# Patient Record
Sex: Female | Born: 1965
Health system: Southern US, Community
[De-identification: ages and names within clinical notes are randomized; demographics above are authoritative.]

## PROBLEM LIST (undated history)

## (undated) DIAGNOSIS — K219 Gastro-esophageal reflux disease without esophagitis: Secondary | ICD-10-CM

## (undated) DIAGNOSIS — J45909 Unspecified asthma, uncomplicated: Secondary | ICD-10-CM

## (undated) DIAGNOSIS — F329 Major depressive disorder, single episode, unspecified: Secondary | ICD-10-CM

## (undated) DIAGNOSIS — R000 Tachycardia, unspecified: Secondary | ICD-10-CM

## (undated) DIAGNOSIS — K8020 Calculus of gallbladder without cholecystitis without obstruction: Secondary | ICD-10-CM

## (undated) DIAGNOSIS — F419 Anxiety disorder, unspecified: Secondary | ICD-10-CM

## (undated) DIAGNOSIS — R519 Headache, unspecified: Secondary | ICD-10-CM

## (undated) DIAGNOSIS — T7840XA Allergy, unspecified, initial encounter: Secondary | ICD-10-CM

## (undated) DIAGNOSIS — E785 Hyperlipidemia, unspecified: Secondary | ICD-10-CM

## (undated) DIAGNOSIS — R011 Cardiac murmur, unspecified: Secondary | ICD-10-CM

## (undated) DIAGNOSIS — M797 Fibromyalgia: Secondary | ICD-10-CM

## (undated) DIAGNOSIS — D689 Coagulation defect, unspecified: Secondary | ICD-10-CM

## (undated) DIAGNOSIS — I10 Essential (primary) hypertension: Secondary | ICD-10-CM

## (undated) DIAGNOSIS — I82401 Acute embolism and thrombosis of unspecified deep veins of right lower extremity: Secondary | ICD-10-CM

## (undated) DIAGNOSIS — J449 Chronic obstructive pulmonary disease, unspecified: Secondary | ICD-10-CM

## (undated) DIAGNOSIS — R0902 Hypoxemia: Secondary | ICD-10-CM

## (undated) DIAGNOSIS — M1990 Unspecified osteoarthritis, unspecified site: Secondary | ICD-10-CM

## (undated) DIAGNOSIS — K5790 Diverticulosis of intestine, part unspecified, without perforation or abscess without bleeding: Secondary | ICD-10-CM

## (undated) DIAGNOSIS — M459 Ankylosing spondylitis of unspecified sites in spine: Secondary | ICD-10-CM

## (undated) DIAGNOSIS — J189 Pneumonia, unspecified organism: Secondary | ICD-10-CM

## (undated) DIAGNOSIS — R51 Headache: Secondary | ICD-10-CM

## (undated) DIAGNOSIS — G8929 Other chronic pain: Secondary | ICD-10-CM

## (undated) DIAGNOSIS — D649 Anemia, unspecified: Secondary | ICD-10-CM

## (undated) DIAGNOSIS — H269 Unspecified cataract: Secondary | ICD-10-CM

## (undated) DIAGNOSIS — F32A Depression, unspecified: Secondary | ICD-10-CM

## (undated) DIAGNOSIS — Z5189 Encounter for other specified aftercare: Secondary | ICD-10-CM

## (undated) DIAGNOSIS — G4730 Sleep apnea, unspecified: Secondary | ICD-10-CM

## (undated) DIAGNOSIS — A630 Anogenital (venereal) warts: Secondary | ICD-10-CM

## (undated) DIAGNOSIS — R87619 Unspecified abnormal cytological findings in specimens from cervix uteri: Secondary | ICD-10-CM

## (undated) DIAGNOSIS — IMO0002 Reserved for concepts with insufficient information to code with codable children: Secondary | ICD-10-CM

## (undated) DIAGNOSIS — K649 Unspecified hemorrhoids: Secondary | ICD-10-CM

## (undated) HISTORY — PX: BREAST BIOPSY: SHX20

## (undated) HISTORY — DX: Unspecified abnormal cytological findings in specimens from cervix uteri: R87.619

## (undated) HISTORY — DX: Reserved for concepts with insufficient information to code with codable children: IMO0002

## (undated) HISTORY — PX: CERVICAL BIOPSY  W/ LOOP ELECTRODE EXCISION: SUR135

## (undated) HISTORY — DX: Anogenital (venereal) warts: A63.0

## (undated) HISTORY — DX: Unspecified hemorrhoids: K64.9

## (undated) HISTORY — DX: Coagulation defect, unspecified: D68.9

## (undated) HISTORY — PX: CHOLECYSTECTOMY: SHX55

## (undated) HISTORY — DX: Tachycardia, unspecified: R00.0

## (undated) HISTORY — DX: Hypoxemia: R09.02

## (undated) HISTORY — DX: Allergy, unspecified, initial encounter: T78.40XA

## (undated) HISTORY — DX: Unspecified cataract: H26.9

## (undated) HISTORY — DX: Depression, unspecified: F32.A

## (undated) HISTORY — DX: Gastro-esophageal reflux disease without esophagitis: K21.9

## (undated) HISTORY — DX: Major depressive disorder, single episode, unspecified: F32.9

## (undated) HISTORY — DX: Anemia, unspecified: D64.9

## (undated) HISTORY — DX: Anxiety disorder, unspecified: F41.9

## (undated) HISTORY — DX: Chronic obstructive pulmonary disease, unspecified: J44.9

## (undated) HISTORY — DX: Encounter for other specified aftercare: Z51.89

## (undated) HISTORY — DX: Cardiac murmur, unspecified: R01.1

## (undated) HISTORY — DX: Headache: R51

## (undated) HISTORY — DX: Ankylosing spondylitis of unspecified sites in spine: M45.9

## (undated) HISTORY — DX: Headache, unspecified: R51.9

## (undated) HISTORY — DX: Diverticulosis of intestine, part unspecified, without perforation or abscess without bleeding: K57.90

## (undated) HISTORY — DX: Unspecified asthma, uncomplicated: J45.909

## (undated) HISTORY — DX: Other chronic pain: G89.29

## (undated) HISTORY — DX: Calculus of gallbladder without cholecystitis without obstruction: K80.20

## (undated) HISTORY — DX: Fibromyalgia: M79.7

## (undated) HISTORY — DX: Pneumonia, unspecified organism: J18.9

## (undated) HISTORY — DX: Hyperlipidemia, unspecified: E78.5

## (undated) HISTORY — DX: Unspecified osteoarthritis, unspecified site: M19.90

---

## 1995-02-17 ENCOUNTER — Emergency Department: Admit: 1995-02-17 | Payer: Self-pay | Source: Emergency Department | Admitting: Pediatric Emergency Medicine

## 1996-01-06 ENCOUNTER — Ambulatory Visit: Admit: 1996-01-06 | Disposition: A | Discharge status: Home / Self Care | Payer: Self-pay | Source: Ambulatory Visit

## 2000-01-01 ENCOUNTER — Ambulatory Visit (HOSPITAL_BASED_OUTPATIENT_CLINIC_OR_DEPARTMENT_OTHER): Admission: RE | Admit: 2000-01-01 | Payer: Self-pay | Source: Ambulatory Visit | Admitting: Gynecology

## 2006-07-15 DIAGNOSIS — K469 Unspecified abdominal hernia without obstruction or gangrene: Secondary | ICD-10-CM

## 2006-07-15 HISTORY — DX: Unspecified abdominal hernia without obstruction or gangrene: K46.9

## 2006-07-15 HISTORY — PX: ABDOMINAL HYSTERECTOMY: SHX81

## 2006-10-14 ENCOUNTER — Inpatient Hospital Stay (HOSPITAL_BASED_OUTPATIENT_CLINIC_OR_DEPARTMENT_OTHER)
Admit: 2006-10-14 | Disposition: A | Discharge status: Home / Self Care | Payer: Self-pay | Source: Ambulatory Visit | Admitting: Obstetrics & Gynecology

## 2007-03-16 ENCOUNTER — Inpatient Hospital Stay (HOSPITAL_BASED_OUTPATIENT_CLINIC_OR_DEPARTMENT_OTHER)
Admission: RE | Admit: 2007-03-16 | Disposition: A | Discharge status: Home / Self Care | Payer: Self-pay | Source: Ambulatory Visit | Admitting: Obstetrics & Gynecology

## 2007-03-16 LAB — CBC- CERNER
Hematocrit: 36 % — ABNORMAL LOW (ref 37.0–47.0)
Hgb: 12.4 G/DL (ref 12.0–16.0)
MCH: 31.4 PG (ref 28.0–32.0)
MCHC: 34.6 G/DL (ref 32.0–36.0)
MCV: 90.7 FL (ref 80.0–100.0)
RBC: 3.97 /mm3 — ABNORMAL LOW (ref 4.20–5.40)
RDW: 13.6 % (ref 11.5–15.0)
WBC: 8.4 /mm3 (ref 3.5–10.8)

## 2007-03-16 LAB — LABOR AND DELIVERY PILOT(SOFT)

## 2007-08-14 ENCOUNTER — Ambulatory Visit: Admission: RE | Admit: 2007-08-14 | Payer: Self-pay | Source: Ambulatory Visit | Admitting: Surgery

## 2009-04-04 ENCOUNTER — Ambulatory Visit
Admission: RE | Admit: 2009-04-04 | Disposition: A | Discharge status: Home / Self Care | Payer: Self-pay | Source: Ambulatory Visit | Admitting: Clinical Cardiac Electrophysiology

## 2011-05-03 NOTE — Op Note (Signed)
DATE OF BIRTH:                        Feb 07, 1966      ADMISSION DATE:                     08/14/2007            PATIENT LOCATION:                     [id number]            DATE OF PROCEDURE:                   08/14/2007      SURGEON:                            Campbell Lerner, MD      ASSISTANT(S):                  PREOPERATIVE DIAGNOSIS:  UMBILICAL HERNIA.            POSTOPERATIVE DIAGNOSIS:  UMBILICAL HERNIA.            PROCEDURE:  UMBILICAL HERNIA REPAIR WITHOUT MESH.            ANESTHESIA:  MAC, local.            INDICATION:  A 45 year old female with a symptomatic umbilical hernia.            DESCRIPTION OF PROCEDURE:  In the preoperative holding area, the patient      received intravenous antibiotics.  The need for DVT prophylaxis was      assessed.  The patient was brought to the operating room and placed on the      operating table in the supine position.  IV sedation was administered.  The      abdominal wall prepped with Betadine gel prep and draped in a sterile      fashion.  A curvilinear supraumbilical incision was made and brought down      to the hernia sac.  The sac was carefully and fully dissected away from the      subcutaneous tissue and umbilical skin down to the level of the fascia.  At      the fascial edges, the sac was excised.  The contents were reduced. The      fascial edges were cleared.  The fascial defect was 9 mm.  The fascial      edges were reapproximated with a figure-of-eight 0 Ethibond suture.  With      the repair completed, the site was inspected.  Hemostasis was assured  The      umbilical skin was tacked back down to the level of the fascia with a      Vicryl suture.  A layered closure was performed with Monocryl suture.  The      skin was washed and dried.  Mastisol and Steri-Strips were placed.  A      Tegaderm dressing was applied.  The patient was taken to the PACU at the      end of the case having tolerated the procedure well.  Electronic Signing MD: Campbell Lerner, MD  ([id number])            D: 08/14/2007 by Campbell Lerner, MD      T: 08/14/2007 by [id number] ([id number]) ([id number])      cc:  Campbell Lerner, MD          Hazle Coca, MD

## 2011-05-03 NOTE — Procedures (Signed)
Higinio Roger, M.D.,FACC, FACP                          Wendie Simmer. Wish, M.D.,FACC      Quincy Carnes Fischer, M.D.,FAAP,FACC,FACP          Caren Hazy. Dorian Heckle, M.D., [facility]      Richardo Hanks, M.D.,FACC                             Carlye Grippe, M.D., [facility]            Name:          Elizabeth Dougherty, Elizabeth Dougherty               Age           45      Study Date     04/04/2009                       Gender        Female      MRN            [id number]                         Race      EP Study No    [id number]                         Ht(in)        64.2      Account No.    [id number]                        Wt(lbs)       119.9      Date of Birth  April 28, 1966                       BSA           1.60            CASE INFORMATION      Referring MD:                   Massimiano,Paul,M.D.      Referring MD 2:                 Oshry,Stacy,M.D.      Electrophysiologist:            Friehling,Ted,M.D.       nd      2  Electrophysiologist:         Del Negro,Albert,M.D.      Anesthesia Type:                IV Anesthesia      Estimated Blood Loss:           0-10 cc                        Procedures      3-D Mapping      Ablation SVT      EP Study            History       45 yo female with long history of sustained palpatations which have become       longer in  duration.  Finally, out patient monitoring demonstrated narrow QRS       tachycardia without evidenct atrial activity suggestive of AVNRT.  The patient       presents now for EPS/Ablation.            Procedural Medication            Dose      Unit       Route                                       75        ml         IV                                       10        ml         Subcut                                       3         ml         Subcut                                       12        mg         IV                                       1         mcg per    IV                                                 min                                       1         mcg per    IV                                                  min                  Catheter Locations      Intracardiac Site      Sheath Size   Catheter Type   Access Site      HRA                    6 FR          Quad            LFV      HIS  6 FR          Quad            RFV      CS                     8 FR          Deca            LSCV      RVA                   6 FR          Quad           LFV      Ablation              8 FR          4mm            RFV            System:    Biosense      Approach:  Right Side            Description of Procedure         The risks, benefits, potential complications and alternatives to the      procedure and sedation were explained to the patient.  Informed consent was      obtained.  Sedation analgesia was administered and monitored by anesthesia      department.               The patient was brought to the procedure room in a fasting state.  ECG and      non-invasive blood pressure monitoring were established and peripheral      intravenous access initiated for medication and fluid administration.  The      patient was prepped and draped in the usual sterile manner.  Prior to obtaining      vascular access Lidocaine 1% was used to infiltrate the area for local      anesthetic effect.  The right and left femoral veins and left subclavian vein      were accessed via the modified Seldinger technique and cannulated with      introducer sheaths.  Three quadrapolar catheters were advanced under      fluoroscopy from the femoral site and positioned at the high right atrium,      bundle of HIS and RV Apex.  A decapolar catheter was advanced under fluoroscopy      from the subclavian site and positioned in the coronary sinus.               Baseline electrophysiologic testing was performed by Dr. Kerrie Pleasure.  The      basic intervals, refractory periods, and conduction characteristics were      obtained. Decremental pacing and programmed stimulation protocols were      delivered from the high right atrium  and right ventricular apex sites.  AVNRT      was induced, CL 302 msec but terminated spontaneously.  Ret rograde conduction      was central and adenosine 12 mg IV delivered during ventricular pacing induced      retrograde as well as anterograde block.               Isuprel was started and repeat atrial pacing easily induced sustained AVNRT,      CL 290 msec.   The atrium  could be dissociated from the ventricle and the      ventricle could be dissociated from the atrium.Catheter               Isuprel was D/C'd and the geometry of the high right atrium was created with      contact mapping using the Biosense 3-dimensional mapping system. Using a      NIKEF" 4mm catheter, RF applications were delivered during sinus      rhythm by Dr. Dorian Heckle.  During RF delivery, junctional acceleration occurred      during which time Dr. Kerrie Pleasure performed atrial overdrive pacing to assure the      integrity of AV conduction.  Several applications of RF energy were delivered.               After an appropriate waiting period, post ablation pacing was performed  and      isola ted AV nodal reentry beats were noted.  Isuprel was restarted at the dose      that facilitated arrhythmia induction and repeat testing induced no arrhythmia.               Isuprel was D/C'd and all catheters and sheaths were removed and hemostasis      was achieved using manual compression. The patient was transferred to the      recovery area in stable condition.               Throughout the procedure, general anesthesia was provided and appropriate      monitoring performed by members of the anesthesia staff.                  Findings      1.  Typical AVNRT      2.  Successful ablation of arrhythmia substrait            Plan      1.  Routine follow-up      2.  ASA 325  mg po daily X 4 weeks                        Basic Intervals            Phase                     Baseline      PA (msec)                 18      AH (msec)                  49      HV (msec)                 63      VV (msec)                 917      AA (msec)                 917                              Conduction Block      Phase                      Pacing Interval Description  Baseline                   428             AV [place]      Baseline                   311             [place]      Baseline                   295             AV [place]      Baseline                   318             AV [place]      Baseline                   318             AV [place]            Refractory Periods      Phase                   Type               Pacing Site Value       PCL      Baseline                ERP                AVN         314         600      Baseline                ERP                Retrograde  248         390                                                 AV Node            Programmed Stimulation            Intervention               Baseline      Cycle Length               435      Site                       HRA      # of Beats                sustained      Initiation Sequence       460      Tachycardia Form          SVT      Termination               self                  Intervention  Baseline      Cycle Length              500      Site                      RVA      Extra Stimuli             S1-S2      ERP                       230      Initiation Sequence       500                  Intervention              Isuprel      Dose                      1      Unit                      mcg/min      Cycle Length              500      Site                      HRA      Extra Stimuli             S1-S3      # of Beats                Sustained      Initiation Sequence       500/320/280      Tachycardia Form          SVT      Tachycardia Cycle Length  311      Termination               260 on HRA                             Updated by  Del Negro,Albert,M.D.  on  04/04/2009 10:18:14 AM                       Jasmine Awe, M.D.  electronically signed  on 04/04/2009 10:24:35 AM      with status of Final

## 2012-01-31 ENCOUNTER — Encounter: Payer: Self-pay | Admitting: Obstetrics & Gynecology

## 2012-01-31 NOTE — Progress Notes (Unsigned)
Spoke with pt regarding [place] results.  Her L paraovarian cyst is marginally larger (by 1 cm in one dimension), but pt still denies any pelvic pain or dysparunea.  Again, discussed laparoscopic removal of the cyst if pt becomes symptomatic, or would just like it to be removed, otherwise will repeat US annually.

## 2012-06-15 ENCOUNTER — Encounter: Payer: Self-pay | Admitting: Obstetrics & Gynecology

## 2012-08-18 ENCOUNTER — Ambulatory Visit: Payer: Self-pay | Admitting: Obstetrics & Gynecology

## 2012-10-09 ENCOUNTER — Encounter: Payer: Self-pay | Admitting: Obstetrics & Gynecology

## 2012-10-09 ENCOUNTER — Ambulatory Visit: Admitting: Obstetrics & Gynecology

## 2012-10-09 VITALS — BP 114/70 | Ht 64.0 in | Wt 114.0 lb

## 2012-10-09 NOTE — Progress Notes (Signed)
Subjective:       Elizabeth Dougherty is a 47 y.o. female here for a routine exam.  Current complaints: none.  Personal health questionnaire reviewed: yes.  Pt reports nl menses q month.  She always has some d/c, but is not sure if it related to her cycle.  No dysuria or dyspareunia.  Nl BM's.  No incontinence.  All 3 kids are very active and doing well.  Still not having any pain issues related to her L paraovarian cyst.     Gynecologic History  Patient's last menstrual period was 09/11/2012.  Contraception: vasectomy  Breast self exam: discussed  Common GYN tests  Last Pap Date: 07/24/11  Last Pap Result: Normal  Last Mammo Date: 01/29/12  Last Mammo Result: Normal    The following portions of the patient's history were reviewed and updated as appropriate: allergies, current medications, past family history, past medical history, past social history, past surgical history and problem list.      Review of Systems  Pertinent items are noted in HPI.      Objective:      BP 114/70  Ht 1.626 m (5\' 4" )  Wt 51.71 kg (114 lb)  BMI 19.56 kg/m2  LMP 09/11/2012  General appearance: alert, appears stated age and cooperative  Neck: no adenopathy, supple, symmetrical, trachea midline and thyroid not enlarged, symmetric, no tenderness/mass/nodules  Breasts: normal appearance, no masses or tenderness, No nipple retraction or dimpling, No nipple discharge or bleeding, No axillary or supraclavicular adenopathy  Abdomen: soft, non-tender; bowel sounds normal; no masses,  no organomegaly  Pelvic exam:     Urinary system: urethral meatus normal   External genitalia: normal general appearance   Vaginal: normal rugae   Cervix: normal appearance   Adnexa: normal bimanual exam   Uterus: normal single, nontender   Rectal: deferred          Assessment:      Healthy female exam.        Plan:      Mammogram ordered.  Follow up in: 1 year.  Thin prep pap/HPV deferred secondary to nl Pap in 2013 with negative HR HPV  Referral given for sono to  f/u L paraovarian cyst

## 2013-06-27 ENCOUNTER — Encounter (HOSPITAL_COMMUNITY): Payer: Self-pay | Admitting: Emergency Medicine

## 2013-06-27 ENCOUNTER — Inpatient Hospital Stay (HOSPITAL_COMMUNITY)
Admission: EM | Admit: 2013-06-27 | Discharge: 2013-06-29 | DRG: 607 | Disposition: A | Discharge status: Home / Self Care | Payer: Self-pay | Attending: Internal Medicine | Admitting: Internal Medicine

## 2013-06-27 DIAGNOSIS — Z8249 Family history of ischemic heart disease and other diseases of the circulatory system: Secondary | ICD-10-CM

## 2013-06-27 DIAGNOSIS — K1370 Unspecified lesions of oral mucosa: Secondary | ICD-10-CM | POA: Diagnosis present

## 2013-06-27 DIAGNOSIS — Z880 Allergy status to penicillin: Secondary | ICD-10-CM

## 2013-06-27 DIAGNOSIS — Z91018 Allergy to other foods: Secondary | ICD-10-CM

## 2013-06-27 DIAGNOSIS — Z833 Family history of diabetes mellitus: Secondary | ICD-10-CM

## 2013-06-27 DIAGNOSIS — Z823 Family history of stroke: Secondary | ICD-10-CM

## 2013-06-27 DIAGNOSIS — Z86718 Personal history of other venous thrombosis and embolism: Secondary | ICD-10-CM

## 2013-06-27 DIAGNOSIS — D72829 Elevated white blood cell count, unspecified: Secondary | ICD-10-CM | POA: Diagnosis present

## 2013-06-27 DIAGNOSIS — R21 Rash and other nonspecific skin eruption: Secondary | ICD-10-CM | POA: Diagnosis present

## 2013-06-27 DIAGNOSIS — E876 Hypokalemia: Secondary | ICD-10-CM | POA: Diagnosis present

## 2013-06-27 DIAGNOSIS — G4730 Sleep apnea, unspecified: Secondary | ICD-10-CM | POA: Diagnosis present

## 2013-06-27 DIAGNOSIS — I10 Essential (primary) hypertension: Secondary | ICD-10-CM | POA: Diagnosis present

## 2013-06-27 DIAGNOSIS — D649 Anemia, unspecified: Secondary | ICD-10-CM | POA: Diagnosis not present

## 2013-06-27 DIAGNOSIS — L500 Allergic urticaria: Principal | ICD-10-CM | POA: Diagnosis present

## 2013-06-27 DIAGNOSIS — Z79899 Other long term (current) drug therapy: Secondary | ICD-10-CM

## 2013-06-27 HISTORY — DX: Essential (primary) hypertension: I10

## 2013-06-27 HISTORY — DX: Sleep apnea, unspecified: G47.30

## 2013-06-27 HISTORY — DX: Acute embolism and thrombosis of unspecified deep veins of right lower extremity: I82.401

## 2013-06-27 LAB — BASIC METABOLIC PANEL
BUN: 14 mg/dL (ref 6–23)
Calcium: 9.3 mg/dL (ref 8.4–10.5)
Chloride: 97 mEq/L (ref 96–112)
Creatinine, Ser: 0.72 mg/dL (ref 0.50–1.10)
GFR calc Af Amer: >90 mL/min (ref >90)
GFR calc non Af Amer: >90 mL/min (ref >90)

## 2013-06-27 LAB — CBC WITH DIFFERENTIAL/PLATELET
Basophils Absolute: 0 10*3/uL (ref 0.0–0.1)
Basophils Relative: 0 % (ref 0–1)
Eosinophils Absolute: 0.3 10*3/uL (ref 0.0–0.7)
Eosinophils Relative: 3 % (ref 0–5)
HCT: 37.2 % (ref 36.0–46.0)
Hemoglobin: 12.5 g/dL (ref 12.0–15.0)
Lymphocytes Relative: 20 % (ref 12–46)
MCH: 24 pg — ABNORMAL LOW (ref 26.0–34.0)
MCHC: 33.6 g/dL (ref 30.0–36.0)
MCV: 71.4 fL — ABNORMAL LOW (ref 78.0–100.0)
Monocytes Absolute: 0.5 10*3/uL (ref 0.1–1.0)
Monocytes Relative: 4 % (ref 3–12)
Neutro Abs: 8.7 10*3/uL — ABNORMAL HIGH (ref 1.7–7.7)
RDW: 15.4 % (ref 11.5–15.5)

## 2013-06-27 LAB — URINALYSIS, ROUTINE W REFLEX MICROSCOPIC
Bilirubin Urine: NEGATIVE
Ketones, ur: NEGATIVE mg/dL
Leukocytes, UA: NEGATIVE
Nitrite: NEGATIVE
Protein, ur: NEGATIVE mg/dL
Urobilinogen, UA: 0.2 mg/dL (ref 0.0–1.0)
pH: 6.5 (ref 5.0–8.0)

## 2013-06-27 MED ORDER — ONDANSETRON HCL 4 MG PO TABS: 4.0000 mg | ORAL_TABLET | Freq: Four times a day (QID) | ORAL | Status: DC | PRN

## 2013-06-27 MED ORDER — FAMOTIDINE IN NACL 20-0.9 MG/50ML-% IV SOLN
20.0000 mg | Freq: Two times a day (BID) | INTRAVENOUS | Status: DC
  Administered 2013-06-28 – 2013-06-29 (×4): 20 mg via INTRAVENOUS
  Filled 2013-06-27 (×6): qty 50

## 2013-06-27 MED ORDER — HYDRALAZINE HCL 20 MG/ML IJ SOLN: 10.0000 mg | INTRAMUSCULAR | Status: DC | PRN

## 2013-06-27 MED ORDER — SODIUM CHLORIDE 0.9 % IV SOLN
Freq: Once | INTRAVENOUS | Status: AC
  Administered 2013-06-27: 1000 mL via INTRAVENOUS

## 2013-06-27 MED ORDER — POTASSIUM CHLORIDE 10 MEQ/100ML IV SOLN
10.0000 meq | INTRAVENOUS | Status: AC
  Administered 2013-06-27 – 2013-06-28 (×2): 10 meq via INTRAVENOUS
  Filled 2013-06-27 (×2): qty 100

## 2013-06-27 MED ORDER — POTASSIUM CHLORIDE IN NACL 20-0.9 MEQ/L-% IV SOLN
INTRAVENOUS | Status: AC
  Administered 2013-06-28: 02:00:00 via INTRAVENOUS
  Filled 2013-06-27 (×2): qty 1000

## 2013-06-27 MED ORDER — DIPHENHYDRAMINE HCL 50 MG/ML IJ SOLN
25.0000 mg | Freq: Four times a day (QID) | INTRAMUSCULAR | Status: DC | PRN
  Administered 2013-06-27 – 2013-06-29 (×6): 25 mg via INTRAVENOUS
  Filled 2013-06-27 (×6): qty 1

## 2013-06-27 MED ORDER — ONDANSETRON HCL 4 MG/2ML IJ SOLN: 4.0000 mg | Freq: Four times a day (QID) | INTRAMUSCULAR | Status: DC | PRN

## 2013-06-27 MED ORDER — ACETAMINOPHEN 650 MG RE SUPP: 650.0000 mg | Freq: Four times a day (QID) | RECTAL | Status: DC | PRN

## 2013-06-27 MED ORDER — ACETAMINOPHEN 325 MG PO TABS: 650.0000 mg | ORAL_TABLET | Freq: Four times a day (QID) | ORAL | Status: DC | PRN

## 2013-06-27 MED ORDER — SODIUM CHLORIDE 0.9 % IJ SOLN
3.0000 mL | Freq: Two times a day (BID) | INTRAMUSCULAR | Status: DC
  Administered 2013-06-28 – 2013-06-29 (×3): 3 mL via INTRAVENOUS

## 2013-06-27 NOTE — H&P (Signed)
[facility] History and Physical  Theresa Pearson ZOX:[id number] DOB: [birth year] DOA: 06/27/2013  Referring physician: ER physician. PCP: No PCP Per Patient [facility] practice.  Chief Complaint: Skin rash and mouth ulcers.  HPI: Theresa Pearson is a 47 y.o. female presented to the ER because of skin rash and mouth ulcers. Patient's symptoms started last night with rash on her lips which eventually started spreading into her tongue and palate. Patient started developing some pain on trying to swallow. Patient eventually noticed rash on her trunk extremities. The rash was vesicular/pustular which was itching. Denies any fever chills. Since her rash was spreading and persistent she came to the ER. Patient states that 2 months ago her primary care had increased her lisinopril dose. And yesterday she had taken some green tea which she had herself. Other than these 2 incidents she has not taken any other new medications. She has not traveled or is no tumor having any insect bites. Has not used any new fragrance or detergents. Patient did not have any difficulty breathing. Denies any nausea vomiting abdominal pain diarrhea. In addition the ER patient was found to be hypokalemic and has been given IV potassium for replacement.  Review of Systems: As presented in the history of presenting illness, rest negative.  Past Medical History  Diagnosis Date  . Hypertension   . Right leg DVT   . Sleep apnea    Past Surgical History  Procedure Laterality Date  . Cesarean section    . Abdominal hysterectomy    . Cholecystectomy     Social History:  reports that she has never smoked. She does not have any smokeless tobacco history on file. She reports that she drinks alcohol. She reports that she does not use illicit drugs. Where does patient live home. Can patient participate in ADLs? Yes.  Allergies  Allergen Reactions  . Penicillins Shortness Of Breath and Rash    Family History:  Family  History  Problem Relation Age of Onset  . CAD Mother   . Diabetes Mellitus II Mother   . Stroke Mother   . Allergy (severe) Sister   . Allergy (severe) Son       Prior to Admission medications   Medication Sig Start Date End Date Taking? Authorizing Provider  chlorthalidone (HYGROTON) 50 MG tablet Take 50 mg by mouth daily.   Yes Historical Provider, MD  diphenhydrAMINE (BENADRYL) 25 mg capsule Take 25-50 mg by mouth 2 (two) times daily as needed for itching or allergies.   Yes Historical Provider, MD  ferrous sulfate 325 (65 FE) MG tablet Take 325 mg by mouth daily with breakfast.   Yes Historical Provider, MD  lisinopril (PRINIVIL,ZESTRIL) 40 MG tablet Take 40 mg by mouth daily.   Yes Historical Provider, MD  metoprolol (LOPRESSOR) 50 MG tablet Take 50 mg by mouth 2 (two) times daily.   Yes Historical Provider, MD  Multiple Vitamins-Minerals (MULTIVITAMIN PO) Take 1 tablet by mouth daily.   Yes Historical Provider, MD    Physical Exam: Filed Vitals:   06/27/13 1915 06/27/13 1952 06/27/13 2000 06/27/13 2030  BP: 147/85 141/74 146/74 136/73  Pulse: 79 77 83 83  Temp:      TempSrc:      Resp: 29 27 21 20   Height:      Weight:      SpO2: 99% 100% 100% 100%     General:  Well-developed and nourished.  Eyes: Anicteric no pallor.  ENT: Patient has multiple oral lesions particularly  on the palate.  Neck: No mass felt.  Cardiovascular: S1-S2 heard.  Respiratory: No rhonchi or crepitations.  Abdomen: Soft nontender bowel sounds present.  Skin: Multiple vesicular/pustular type rash all over the body.  Musculoskeletal: No edema.  Psychiatric: Appears normal.  Neurologic: Alert awake oriented to time place and person. Moves all extremities.  Labs on Admission:  Basic Metabolic Panel:  Recent Labs Lab 06/27/13 1724  NA 138  K 2.6*  CL 97  CO2 30  GLUCOSE 84  BUN 14  CREATININE 0.72  CALCIUM 9.3   Liver Function Tests: No results found for this basename: AST,  ALT, ALKPHOS, BILITOT, PROT, ALBUMIN,  in the last 168 hours No results found for this basename: LIPASE, AMYLASE,  in the last 168 hours No results found for this basename: AMMONIA,  in the last 168 hours CBC:  Recent Labs Lab 06/27/13 1724  WBC 11.9*  NEUTROABS 8.7*  HGB 12.5  HCT 37.2  MCV 71.4*  PLT 352   Cardiac Enzymes: No results found for this basename: CKTOTAL, CKMB, CKMBINDEX, TROPONINI,  in the last 168 hours  BNP (last 3 results) No results found for this basename: PROBNP,  in the last 8760 hours CBG: No results found for this basename: GLUCAP,  in the last 168 hours  Radiological Exams on Admission: No results found.  EKG: Independently reviewed. Normal sinus rhythm.  Assessment/Plan Principal Problem:   Rash Active Problems:   Hypokalemia   HTN (hypertension)   Sleep apnea   Skin rash   1. Skin rash with oral lesions - the differentials include allergic reaction versus viral infection. At this time I have placed patient on when necessary IV Benadryl for itching and also on Pepcid. I have ordered blood cultures, HIV, coxsackievirus RPR ANA and sedimentation rate an acute hepatitis panel. Placed patient on clear liquid diet for now. Closely observe and may consult infectious disease in a.m. Holding off antihypertensives specifically lisinopril. 2. Hypokalemia - I think this is possibly secondary to patient taking diuretics without her oral potassium replacement. Continue with IV potassium replacement and recheck potassium in a.m. along with magnesium levels. 3. Leukocytosis - probably reactionary. Patient is afebrile. I have ordered blood cultures. Closely follow CBC. 4. Hypertension - holding lisinopril and beta blockers for now due to possible allergic reaction. Patient has been placed on clear IV hydralazine for systolic blood pressure more than 160. 5. Sleep apnea - patient has been placed on CPAP at bedtime.    Code Status: Full code.  Family  Communication: None.  Disposition Plan: Admit to inpatient.    Theresa Pearson N. [facility] Pager [phone].  If 7PM-7AM, please contact night-coverage [email or web address] Password [email or web address] 06/27/2013, 9:59 PM

## 2013-06-27 NOTE — ED Provider Notes (Signed)
CSN: [id number]     Arrival date & time 06/27/13  1448 History   First MD Initiated Contact with Patient 06/27/13 1603     Chief Complaint  Patient presents with  . Allergic Reaction   (Consider location/radiation/quality/duration/timing/severity/associated sxs/prior Treatment) Patient is a 47 y.o. female presenting with allergic reaction.  Allergic Reaction Presenting symptoms: rash    Theresa Pearson is a 47 y.o. female who presents to the emergency department with a rash.  Patient reports that she developed an itchy rash. Started last night. No pain. Located over the spaces of hands, arms, chest, thighs. Moderate in severity. Also reports painful lesions in her mouth as well. No feeling of tongue swelling or airway closure. No new medications. Patient has allergy to berries but has not had any recent days. No new lotions or soaps. Never had a rash like this before. Try taking Benadryl to no avail. No other symptoms.  Past Medical History  Diagnosis Date  . Hypertension   . Right leg DVT   . Sleep apnea    Past Surgical History  Procedure Laterality Date  . Cesarean section    . Abdominal hysterectomy    . Cholecystectomy     No family history on file. History  Substance Use Topics  . Smoking status: Never Smoker   . Smokeless tobacco: Not on file  . Alcohol Use: Yes   OB History   Grav Para Term Preterm Abortions TAB SAB Ect Mult Living                 Review of Systems  Constitutional: Negative for fever and chills.  HENT: Negative for congestion and rhinorrhea.   Respiratory: Negative for cough and shortness of breath.   Cardiovascular: Negative for chest pain.  Gastrointestinal: Negative for nausea, vomiting, abdominal pain, diarrhea and abdominal distention.  Endocrine: Negative for polyuria.  Genitourinary: Negative for dysuria.  Musculoskeletal: Negative for neck pain and neck stiffness.  Skin: Positive for rash.  Neurological: Negative for headaches.   Psychiatric/Behavioral: Negative.     Allergies  Penicillins  Home Medications  No current outpatient prescriptions on file. BP 160/105  Pulse 90  Temp(Src) 99 F (37.2 C) (Oral)  Resp 36  Ht 5\' 6"  (1.676 m)  Wt 238 lb 3 oz (108.041 kg)  BMI 38.46 kg/m2  SpO2 97% Physical Exam  Nursing note and vitals reviewed. Constitutional: She is oriented to person, place, and time. She appears well-developed and well-nourished. No distress.  HENT:  Head: Normocephalic and atraumatic.  Right Ear: External ear normal.  Left Ear: External ear normal.  Nose: Nose normal.  Mouth/Throat: Oropharynx is clear and moist. No oropharyngeal exudate.  Eyes: EOM are normal. Pupils are equal, round, and reactive to light.  Neck: Normal range of motion. Neck supple. No tracheal deviation present.  Cardiovascular: Normal rate.   Pulmonary/Chest: Effort normal and breath sounds normal. No stridor. No respiratory distress. She has no wheezes. She has no rales.  Abdominal: Soft. She exhibits no distension. There is no tenderness. There is no rebound.  Musculoskeletal: Normal range of motion.  Neurological: She is alert and oriented to person, place, and time.  Skin: Skin is warm and dry. Rash is not pustular. She is not diaphoretic.  Erythematous papular reaction encompassing chest, arms, hands, and anterior thighs.  Papules on mucosa of mouth as well.    ED Course  Procedures (including critical care time) Labs Review Labs Reviewed  CBC WITH DIFFERENTIAL - Abnormal; Notable for the  following:    WBC 11.9 (*)    RBC 5.21 (*)    MCV 71.4 (*)    MCH 24.0 (*)    Neutro Abs 8.7 (*)    All other components within normal limits  BASIC METABOLIC PANEL - Abnormal; Notable for the following:    Potassium 2.6 (*)    All other components within normal limits  CULTURE, BLOOD (ROUTINE X 2)  CULTURE, BLOOD (ROUTINE X 2)  URINALYSIS, ROUTINE W REFLEX MICROSCOPIC  RPR  MAGNESIUM  TSH  ANA  SEDIMENTATION  RATE  BASIC METABOLIC PANEL  CBC  HIV ANTIBODY (ROUTINE TESTING)   Imaging Review No results found.  EKG Interpretation   None       MDM   1. Hypokalemia   2. HTN (hypertension)   3. Skin rash   4. Sleep apnea     Theresa Pearson is a 47 y.o. female who presents to the emergency department with a rash x 1 day that encompasses much of body and with mucosal involvement.  No sloughing of skin or vital sign abnormalities.  No indication for emergent derm consult.  Labs checked and with significant hypokalemia.  Will initiate replacement and consult hospitalists for admission.  Patient safe for admission to floor.  Patient admitted.   Arloa Koh, MD 06/28/13 0030

## 2013-06-27 NOTE — ED Notes (Signed)
Introduced self to pt.  Reports painful "allergic reaction" that appears to be small fluid filled vesicles, large concentration to mouth/lip area that began last evening and has been getting progressively worse. "rash" itches.  Unable to eat well due to mouth pain.  Pt alert, appropriate, carries on conversation.  Reports chronic issues with hypo K+.  Awaiting lab results.

## 2013-06-27 NOTE — ED Notes (Signed)
Report to [facility] on 2w.  Pt to go to floor on monitor with EMT accompanying.

## 2013-06-27 NOTE — ED Notes (Signed)
Pt was given applesauce, at without difficulty.  Friend just brought in chicken noodle soup for her.  She is going to attempt eating it.  IV continues to infuse without difficulty.

## 2013-06-27 NOTE — ED Notes (Signed)
Pt reports allergic reaction onset last night. Pt reports swelling to bottom lip and inside mouth. Pt also reports red raised bumps on arms. Legs, chest and in head. Pt denies shortness of breath, reports feels like her throat is closing. Only new product was brewed herself green tea.

## 2013-06-28 DIAGNOSIS — I10 Essential (primary) hypertension: Secondary | ICD-10-CM

## 2013-06-28 DIAGNOSIS — E876 Hypokalemia: Secondary | ICD-10-CM

## 2013-06-28 DIAGNOSIS — R21 Rash and other nonspecific skin eruption: Secondary | ICD-10-CM

## 2013-06-28 LAB — BASIC METABOLIC PANEL
BUN: 10 mg/dL (ref 6–23)
Calcium: 8.4 mg/dL (ref 8.4–10.5)
Chloride: 101 mEq/L (ref 96–112)
GFR calc Af Amer: >90 mL/min (ref >90)
GFR calc non Af Amer: >90 mL/min (ref >90)
Glucose, Bld: 87 mg/dL (ref 70–99)
Potassium: 2.8 mEq/L — ABNORMAL LOW (ref 3.5–5.1)

## 2013-06-28 LAB — CBC
HCT: 32.9 % — ABNORMAL LOW (ref 36.0–46.0)
Hemoglobin: 10.8 g/dL — ABNORMAL LOW (ref 12.0–15.0)
MCH: 23.4 pg — ABNORMAL LOW (ref 26.0–34.0)
MCHC: 32.8 g/dL (ref 30.0–36.0)
MCV: 71.4 fL — ABNORMAL LOW (ref 78.0–100.0)
RDW: 15.5 % (ref 11.5–15.5)
WBC: 10.7 10*3/uL — ABNORMAL HIGH (ref 4.0–10.5)

## 2013-06-28 LAB — HEPATITIS PANEL, ACUTE
HCV Ab: NEGATIVE
Hep B C IgM: NONREACTIVE
Hepatitis B Surface Ag: NEGATIVE

## 2013-06-28 LAB — POTASSIUM: Potassium: 3.2 mEq/L — ABNORMAL LOW (ref 3.5–5.1)

## 2013-06-28 LAB — TSH: TSH: 1.454 u[IU]/mL (ref 0.350–4.500)

## 2013-06-28 LAB — HIV ANTIBODY (ROUTINE TESTING W REFLEX): HIV: NONREACTIVE

## 2013-06-28 LAB — SEDIMENTATION RATE: Sed Rate: 25 mm/hr — ABNORMAL HIGH (ref 0–22)

## 2013-06-28 LAB — MAGNESIUM: Magnesium: 1.9 mg/dL (ref 1.5–2.5)

## 2013-06-28 LAB — IRON AND TIBC
TIBC: 298 ug/dL (ref 250–470)
UIBC: 235 ug/dL (ref 125–400)

## 2013-06-28 MED ORDER — LIDOCAINE VISCOUS 2 % MT SOLN
15.0000 mL | Freq: Four times a day (QID) | OROMUCOSAL | Status: DC | PRN
  Administered 2013-06-28 – 2013-06-29 (×2): 15 mL via OROMUCOSAL
  Filled 2013-06-28 (×3): qty 15

## 2013-06-28 MED ORDER — CAMPHOR-MENTHOL 0.5-0.5 % EX LOTN
TOPICAL_LOTION | CUTANEOUS | Status: DC | PRN
  Administered 2013-06-28: 02:00:00 via TOPICAL
  Filled 2013-06-28: qty 222

## 2013-06-28 MED ORDER — AMLODIPINE BESYLATE 5 MG PO TABS
5.0000 mg | ORAL_TABLET | Freq: Every day | ORAL | Status: DC
  Administered 2013-06-28 – 2013-06-29 (×2): 5 mg via ORAL
  Filled 2013-06-28 (×2): qty 1

## 2013-06-28 MED ORDER — POTASSIUM CHLORIDE CRYS ER 20 MEQ PO TBCR
60.0000 meq | EXTENDED_RELEASE_TABLET | Freq: Two times a day (BID) | ORAL | Status: DC
  Administered 2013-06-28 (×2): 60 meq via ORAL
  Filled 2013-06-28 (×4): qty 3

## 2013-06-28 MED ORDER — POTASSIUM CHLORIDE CRYS ER 20 MEQ PO TBCR
60.0000 meq | EXTENDED_RELEASE_TABLET | Freq: Once | ORAL | Status: AC
  Administered 2013-06-28: 60 meq via ORAL
  Filled 2013-06-28: qty 3

## 2013-06-28 MED ORDER — METOPROLOL TARTRATE 50 MG PO TABS
50.0000 mg | ORAL_TABLET | Freq: Two times a day (BID) | ORAL | Status: DC
  Administered 2013-06-28 – 2013-06-29 (×3): 50 mg via ORAL
  Filled 2013-06-28 (×4): qty 1

## 2013-06-28 NOTE — ED Provider Notes (Signed)
I saw and evaluated the patient, reviewed the resident's note and I agree with the findings and plan.  EKG Interpretation    Date/Time:    Ventricular Rate:    PR Interval:    QRS Duration:   QT Interval:    QTC Calculation:   R Axis:     Text Interpretation:              Date: 06/28/2013  Rate: 79  Rhythm: normal sinus rhythm  QRS Axis: normal  Intervals: normal  ST/T Wave abnormalities: normal  Conduction Disutrbances:none  Narrative Interpretation:   Old EKG Reviewed: none available  Patient here with rash. Began in her mouth yesterday, spread to extremities and trunk. Pruritic. No systemic symptoms, no fevers, no vomiting. Unable to eat due to pain in mouth. Small pustules on palate and interior lips. Small red, blanchable papules on extremities and trunk. Lesions on palms and soles also. Unclear etiology, only new lifestyle change is she steeped her own green tea for the first time. Labs also show hypokalemia.  Admitting to medicine.      Dagmar Hait, MD 06/28/13 [phone]

## 2013-06-28 NOTE — Consult Note (Signed)
INFECTIOUS DISEASE CONSULT NOTE  Date of Admission:  06/27/2013  Date of Consult:  06/28/2013  Reason for Consult: Rash Referring Physician: Tristan Schroeder  Impression/Recommendation Rash Hypokalemia Hypertension  HIV/Hep A/B/C, RPR (-) Coxsackie pending.  Check Histo, crypto BCx pending Needs skin Bx  Comment- most commonly this would be a drug reaction. Her hx of medication change and new drug preceding this  suggests this. Her lesions are raised and do have a somewhat wart like appearance however acute disseminated warts/papilloma or molluscum contgiosum would be very unusual.   Thank you so much for this interesting consult,   Johny Sax (pager) [phone] [email or web address]  Theresa Pearson is an 47 y.o. female.  HPI: 47 yo F who was recently started on increased dose of lisinopril (2 months ago) and has developed erythema of lips, then tongue and palate. At that same time, she was started on a new diuretic which she is unable to remember the name of (not HCTZ, lasix, aldactone). Her rash has worsened and spread onto her extremities. No f/c.   No new soaps of shampoos No new detergents No new perfumes No pets, no animal exposures, no bug bites.  States she got all childhood vaccines.   Past Medical History  Diagnosis Date  . Hypertension   . Right leg DVT   . Sleep apnea     Past Surgical History  Procedure Laterality Date  . Cesarean section    . Abdominal hysterectomy    . Cholecystectomy       Allergies  Allergen Reactions  . Penicillins Shortness Of Breath and Rash    Medications:  Scheduled: . amLODipine  5 mg Oral Daily  . famotidine (PEPCID) IV  20 mg Intravenous Q12H  . metoprolol tartrate  50 mg Oral BID  . potassium chloride  60 mEq Oral BID  . potassium chloride  60 mEq Oral Once  . sodium chloride  3 mL Intravenous Q12H    Total days of antibiotics: 0          Social History:  reports that she has never smoked. She does not have  any smokeless tobacco history on file. She reports that she drinks alcohol. She reports that she does not use illicit drugs.  Family History  Problem Relation Age of Onset  . CAD Mother   . Diabetes Mellitus II Mother   . Stroke Mother   . Allergy (severe) Sister   . Allergy (severe) Son     General ROS: had several episodes over the past month where she felt "sick" but resolved without intervention. she is not able to define exactly what this means. no f/c, +dysphagia, +oral ulcers, no SOB, cough, dyspnea. no change in vision, normal BM, normal urination. weight has been steady. see HPI.  Blood pressure 134/80, pulse 74, temperature 98.7 F (37.1 C), temperature source Oral, resp. rate 18, height 5\' 6"  (1.676 m), weight 107.548 kg (237 lb 1.6 oz), SpO2 98.00%. General appearance: alert, cooperative and no distress Eyes: negative findings: pupils equal, round, reactive to light and accomodation Throat: erosions on her palate. there is swelling and blistering of her lips.  Neck: no adenopathy and supple, symmetrical, trachea midline Lungs: clear to auscultation bilaterally Heart: regular rate and rhythm and systolic murmur: early systolic 3/6, crescendo at 2nd right intercostal space Abdomen: normal findings: bowel sounds normal and soft, non-tender Extremities: edema none Skin: keloid - back and papular - scattered, non-pustular; wart like.    Results for orders placed during the hospital  encounter of 06/27/13 (from the past 48 hour(s))  CBC WITH DIFFERENTIAL     Status: Abnormal   Collection Time    06/27/13  5:24 PM      Result Value Range   WBC 11.9 (*) 4.0 - 10.5 K/uL   RBC 5.21 (*) 3.87 - 5.11 MIL/uL   Hemoglobin 12.5  12.0 - 15.0 g/dL   HCT 16.1  09.6 - 04.5 %   MCV 71.4 (*) 78.0 - 100.0 fL   MCH 24.0 (*) 26.0 - 34.0 pg   MCHC 33.6  30.0 - 36.0 g/dL   RDW 40.9  81.1 - 91.4 %   Platelets 352  150 - 400 K/uL   Neutrophils Relative % 73  43 - 77 %   Neutro Abs 8.7 (*) 1.7  - 7.7 K/uL   Lymphocytes Relative 20  12 - 46 %   Lymphs Abs 2.4  0.7 - 4.0 K/uL   Monocytes Relative 4  3 - 12 %   Monocytes Absolute 0.5  0.1 - 1.0 K/uL   Eosinophils Relative 3  0 - 5 %   Eosinophils Absolute 0.3  0.0 - 0.7 K/uL   Basophils Relative 0  0 - 1 %   Basophils Absolute 0.0  0.0 - 0.1 K/uL  BASIC METABOLIC PANEL     Status: Abnormal   Collection Time    06/27/13  5:24 PM      Result Value Range   Sodium 138  135 - 145 mEq/L   Potassium 2.6 (*) 3.5 - 5.1 mEq/L   Comment: CRITICAL RESULT CALLED TO, READ BACK BY AND VERIFIED WITH:     THOMPSON A,RN 1835 06/27/13 SCALES H   Chloride 97  96 - 112 mEq/L   CO2 30  19 - 32 mEq/L   Glucose, Bld 84  70 - 99 mg/dL   BUN 14  6 - 23 mg/dL   Creatinine, Ser 7.82  0.50 - 1.10 mg/dL   Calcium 9.3  8.4 - 95.6 mg/dL   GFR calc non Af Amer >90  >90 mL/min   GFR calc Af Amer >90  >90 mL/min   Comment: (NOTE)     The eGFR has been calculated using the CKD EPI equation.     This calculation has not been validated in all clinical situations.     eGFR's persistently <90 mL/min signify possible Chronic Kidney     Disease.  RPR     Status: None   Collection Time    06/27/13  5:24 PM      Result Value Range   RPR NON REACTIVE  NON REACTIVE   Comment: Performed at [facility]  URINALYSIS, ROUTINE W REFLEX MICROSCOPIC     Status: None   Collection Time    06/27/13  5:30 PM      Result Value Range   Color, Urine YELLOW  YELLOW   APPearance CLEAR  CLEAR   Specific Gravity, Urine 1.013  1.005 - 1.030   pH 6.5  5.0 - 8.0   Glucose, UA NEGATIVE  NEGATIVE mg/dL   Hgb urine dipstick NEGATIVE  NEGATIVE   Bilirubin Urine NEGATIVE  NEGATIVE   Ketones, ur NEGATIVE  NEGATIVE mg/dL   Protein, ur NEGATIVE  NEGATIVE mg/dL   Urobilinogen, UA 0.2  0.0 - 1.0 mg/dL   Nitrite NEGATIVE  NEGATIVE   Leukocytes, UA NEGATIVE  NEGATIVE   Comment: MICROSCOPIC NOT DONE ON URINES WITH NEGATIVE PROTEIN, BLOOD, LEUKOCYTES, NITRITE, OR GLUCOSE <1000  mg/dL.  MAGNESIUM     Status: None   Collection Time    06/27/13 11:35 PM      Result Value Range   Magnesium 1.9  1.5 - 2.5 mg/dL  TSH     Status: None   Collection Time    06/27/13 11:35 PM      Result Value Range   TSH 1.454  0.350 - 4.500 uIU/mL   Comment: Performed at [facility]  SEDIMENTATION RATE     Status: Abnormal   Collection Time    06/27/13 11:35 PM      Result Value Range   Sed Rate 25 (*) 0 - 22 mm/hr  HIV ANTIBODY (ROUTINE TESTING)     Status: None   Collection Time    06/27/13 11:35 PM      Result Value Range   HIV NON REACTIVE  NON REACTIVE   Comment: Performed at [facility]  BASIC METABOLIC PANEL     Status: Abnormal   Collection Time    06/28/13  5:04 AM      Result Value Range   Sodium 140  135 - 145 mEq/L   Potassium 2.8 (*) 3.5 - 5.1 mEq/L   Chloride 101  96 - 112 mEq/L   CO2 26  19 - 32 mEq/L   Glucose, Bld 87  70 - 99 mg/dL   BUN 10  6 - 23 mg/dL   Creatinine, Ser 6.04  0.50 - 1.10 mg/dL   Calcium 8.4  8.4 - 54.0 mg/dL   GFR calc non Af Amer >90  >90 mL/min   GFR calc Af Amer >90  >90 mL/min   Comment: (NOTE)     The eGFR has been calculated using the CKD EPI equation.     This calculation has not been validated in all clinical situations.     eGFR's persistently <90 mL/min signify possible Chronic Kidney     Disease.  CBC     Status: Abnormal   Collection Time    06/28/13  5:04 AM      Result Value Range   WBC 10.7 (*) 4.0 - 10.5 K/uL   RBC 4.61  3.87 - 5.11 MIL/uL   Hemoglobin 10.8 (*) 12.0 - 15.0 g/dL   HCT 98.1 (*) 19.1 - 47.8 %   MCV 71.4 (*) 78.0 - 100.0 fL   MCH 23.4 (*) 26.0 - 34.0 pg   MCHC 32.8  30.0 - 36.0 g/dL   RDW 29.5  62.1 - 30.8 %   Platelets 337  150 - 400 K/uL  HEPATITIS PANEL, ACUTE     Status: None   Collection Time    06/28/13  5:04 AM      Result Value Range   Hepatitis B Surface Ag NEGATIVE  NEGATIVE   HCV Ab NEGATIVE  NEGATIVE   Hep A IgM NON REACTIVE  NON REACTIVE   Hep B C IgM NON  REACTIVE  NON REACTIVE   Comment: (NOTE)     High levels of Hepatitis B Core IgM antibody are detectable     during the acute stage of Hepatitis B. This antibody is used     to differentiate current from past HBV infection.     Performed at [facility]  POTASSIUM     Status: Abnormal   Collection Time    06/28/13 11:00 AM      Result Value Range   Potassium 3.2 (*) 3.5 - 5.1 mEq/L   No  results found for this basename: sdes, specrequest, cult, reptstatus   No results found. No results found for this or any previous visit (from the past 240 hour(s)).    06/28/2013, 3:36 PM     LOS: 1 day

## 2013-06-28 NOTE — Care Management Note (Signed)
    Page 1 of 1   06/29/2013     3:41:06 PM   CARE MANAGEMENT NOTE 06/29/2013  Patient:  Theresa Pearson, Theresa Pearson   Account Number:  [id number]  Date Initiated:  06/28/2013  Documentation initiated by:  Lyn Deemer  Subjective/Objective Assessment:   PT ADM ON 12/14 WITH RASH, HYPOKALEMIA.  PTA, PT INDEPENDENT OF ADLS.     Action/Plan:   WILL FOLLOW FOR DISCHARGE NEEDS AS PT PROGRESSES.  PT IS UNINSURED.   Anticipated DC Date:  06/29/2013   Anticipated DC Plan:  HOME/SELF CARE      DC Planning Services  CM consult  [facility] Program  [facility]  Follow-up appt scheduled      Choice offered to / List presented to:             Status of service:  Completed, signed off Medicare Important Message given?   (If response is "NO", the following Medicare IM given date fields will be blank) Date Medicare IM given:   Date Additional Medicare IM given:    Discharge Disposition:  HOME/SELF CARE  Per UR Regulation:  Reviewed for med. necessity/level of care/duration of stay  If discussed at Long Length of Stay Meetings, dates discussed:    Comments:  06/29/13 Rosalita Chessman [phone] PT FOR DC HOME TODAY.  SHE IS ELIGIBLE FOR MEDICATION ASSISTANCE THROUGH [facility].  MATCH LETTER GIVEN WITH EXPLANATION OF PROGRAM BENEFITS.  FOLLOW UP APPT MADE FOR DECEMBER 23 AT 11:00 AT [facility].

## 2013-06-28 NOTE — Progress Notes (Addendum)
[facility] PROGRESS NOTE  Theresa Pearson YNW:[id number] DOB: October 09, 1965 DOA: 06/27/2013 PCP: No PCP Per Patient  Assessment/Plan: 47 y/o female with PMH of HTN presented with generalized rash, and oral ulcers   1. Skin rash with oral lesions of unclear etiology; possible allergic urticarial reaction(green tea self brewing at home prior to rash) vs viral etiology  -cont supportive care, will obtain ID opinion; pend viral test results;  holding lisinopril;    2. Hypo K likely diuretic induced;  -replace K, recheck   3. Anemia likely IDA; check iron profile;   4. HTN uncontrolled;  Resume BB; holding ACE, start norvasc;   Code Status: full Family Communication: d/w patient  (indicate person spoken with, relationship, and if by phone, the number) Disposition Plan: home in 24-48 hours    Consultants:  ID  Procedures:  None   Antibiotics:  None  (indicate start date, and stop date if known)  HPI/Subjective: alert  Objective: Filed Vitals:   06/28/13 0443  BP: 154/80  Pulse: 85  Temp: 99.2 F (37.3 C)  Resp: 20    Intake/Output Summary (Last 24 hours) at 06/28/13 0814 Last data filed at 06/28/13 0525  Gross per 24 hour  Intake      0 ml  Output    650 ml  Net   -650 ml   Filed Weights   06/27/13 1455 06/27/13 2314  Weight: 108.041 kg (238 lb 3 oz) 107.548 kg (237 lb 1.6 oz)    Exam:   General:  alert  Cardiovascular: s1,s2 rrr  Respiratory: CTA BL  Abdomen: soft, nt, nd   Musculoskeletal: no LE edema    Data Reviewed: Basic Metabolic Panel:  Recent Labs Lab 06/27/13 1724 06/27/13 2335 06/28/13 0504  NA 138  --  140  K 2.6*  --  2.8*  CL 97  --  101  CO2 30  --  26  GLUCOSE 84  --  87  BUN 14  --  10  CREATININE 0.72  --  0.64  CALCIUM 9.3  --  8.4  MG  --  1.9  --    Liver Function Tests: No results found for this basename: AST, ALT, ALKPHOS, BILITOT, PROT, ALBUMIN,  in the last 168 hours No results found for this basename:  LIPASE, AMYLASE,  in the last 168 hours No results found for this basename: AMMONIA,  in the last 168 hours CBC:  Recent Labs Lab 06/27/13 1724 06/28/13 0504  WBC 11.9* 10.7*  NEUTROABS 8.7*  --   HGB 12.5 10.8*  HCT 37.2 32.9*  MCV 71.4* 71.4*  PLT 352 337   Cardiac Enzymes: No results found for this basename: CKTOTAL, CKMB, CKMBINDEX, TROPONINI,  in the last 168 hours BNP (last 3 results) No results found for this basename: PROBNP,  in the last 8760 hours CBG: No results found for this basename: GLUCAP,  in the last 168 hours  No results found for this or any previous visit (from the past 240 hour(s)).   Studies: No results found.  Scheduled Meds: . famotidine (PEPCID) IV  20 mg Intravenous Q12H  . potassium chloride  60 mEq Oral BID  . sodium chloride  3 mL Intravenous Q12H   Continuous Infusions: . 0.9 % NaCl with KCl 20 mEq / L 75 mL/hr at 06/28/13 6578    Principal Problem:   Rash Active Problems:   Hypokalemia   HTN (hypertension)   Sleep apnea   Skin rash    Time  spent: >35 minutes     Esperanza Sheets  [facility] Pager [phone]. If 7PM-7AM, please contact night-coverage at [email or web address], password [facility] 06/28/2013, 8:14 AM  LOS: 1 day

## 2013-06-28 NOTE — Progress Notes (Signed)
Patient refused CPAP for the night. Patient on room air with Sp02=95%

## 2013-06-29 LAB — BASIC METABOLIC PANEL
BUN: 8 mg/dL (ref 6–23)
Calcium: 8.5 mg/dL (ref 8.4–10.5)
Creatinine, Ser: 0.72 mg/dL (ref 0.50–1.10)
GFR calc Af Amer: >90 mL/min (ref >90)
GFR calc non Af Amer: >90 mL/min (ref >90)
Potassium: 4.3 mEq/L (ref 3.5–5.1)

## 2013-06-29 LAB — ANA: Anti Nuclear Antibody(ANA): NEGATIVE

## 2013-06-29 MED ORDER — PREDNISONE 10 MG PO TABS: 20.0000 mg | ORAL_TABLET | Freq: Every day | ORAL | Status: DC

## 2013-06-29 MED ORDER — PREDNISONE 20 MG PO TABS
40.0000 mg | ORAL_TABLET | Freq: Once | ORAL | Status: AC
  Administered 2013-06-29: 40 mg via ORAL
  Filled 2013-06-29: qty 2

## 2013-06-29 MED ORDER — POTASSIUM CHLORIDE ER 10 MEQ PO TBCR: 10.0000 meq | EXTENDED_RELEASE_TABLET | Freq: Every day | ORAL | Status: DC

## 2013-06-29 MED ORDER — LIDOCAINE VISCOUS 2 % MT SOLN: 15.0000 mL | Freq: Four times a day (QID) | OROMUCOSAL | Status: DC | PRN

## 2013-06-29 NOTE — Progress Notes (Signed)
INFECTIOUS DISEASE PROGRESS NOTE  ID: Theresa Pearson is a 47 y.o. female with  Principal Problem:   Rash Active Problems:   Hypokalemia   HTN (hypertension)   Sleep apnea   Skin rash  Subjective: Continued difficulty with swallowing.   Abtx:  Anti-infectives   None      Medications:  Scheduled: . amLODipine  5 mg Oral Daily  . famotidine (PEPCID) IV  20 mg Intravenous Q12H  . metoprolol tartrate  50 mg Oral BID  . potassium chloride  60 mEq Oral BID  . sodium chloride  3 mL Intravenous Q12H    Objective: Vital signs in last 24 hours: Temp:  [98.6 F (37 C)-98.8 F (37.1 C)] 98.6 F (37 C) (12/16 0405) Pulse Rate:  [74-85] 75 (12/16 0405) Resp:  [16-18] 16 (12/16 0405) BP: (134-141)/(76-80) 141/76 mmHg (12/16 0405) SpO2:  [97 %-99 %] 97 % (12/16 0405)   General appearance: alert, cooperative and no distress Throat: mild erythema, erosions of lip, palate.  Resp: clear to auscultation bilaterally Cardio: regular rate and rhythm GI: normal findings: bowel sounds normal and soft, non-tender Skin: disseminated, raised lesions, wart like.   Lab Results  Recent Labs  06/27/13 1724 06/28/13 0504 06/28/13 1100 06/29/13 0400  WBC 11.9* 10.7*  --   --   HGB 12.5 10.8*  --   --   HCT 37.2 32.9*  --   --   NA 138 140  --  137  K 2.6* 2.8* 3.2* 4.3  CL 97 101  --  105  CO2 30 26  --  25  BUN 14 10  --  8  CREATININE 0.72 0.64  --  0.72   Liver Panel No results found for this basename: PROT, ALBUMIN, AST, ALT, ALKPHOS, BILITOT, BILIDIR, IBILI,  in the last 72 hours Sedimentation Rate  Recent Labs  06/27/13 2335  ESRSEDRATE 25*   C-Reactive Protein No results found for this basename: CRP,  in the last 72 hours  Microbiology: Recent Results (from the past 240 hour(s))  CULTURE, BLOOD (ROUTINE X 2)     Status: None   Collection Time    06/27/13 11:35 PM      Result Value Range Status   Specimen Description BLOOD RIGHT ANTECUBITAL   Final   Special  Requests BOTTLES DRAWN AEROBIC ONLY 10CC   Final   Culture  Setup Time     Final   Value: 06/28/2013 09:36     Performed at [facility]   Culture     Final   Value:        BLOOD CULTURE RECEIVED NO GROWTH TO DATE CULTURE WILL BE HELD FOR 5 DAYS BEFORE ISSUING A FINAL NEGATIVE REPORT     Performed at [facility]   Report Status PENDING   Incomplete  CULTURE, BLOOD (ROUTINE X 2)     Status: None   Collection Time    06/27/13 11:40 PM      Result Value Range Status   Specimen Description BLOOD RIGHT HAND   Final   Special Requests BOTTLES DRAWN AEROBIC ONLY 4CC   Final   Culture  Setup Time     Final   Value: 06/28/2013 09:36     Performed at [facility]   Culture     Final   Value:        BLOOD CULTURE RECEIVED NO GROWTH TO DATE CULTURE WILL BE HELD FOR 5 DAYS BEFORE ISSUING A FINAL  NEGATIVE REPORT     Performed at [facility]   Report Status PENDING   Incomplete    Studies/Results: No results found.   Assessment/Plan: Rash  Hypokalemia  Hypertension  Total days of antibiotics 0  Would ask surgery to bx Her rash appears worse         Theresa Pearson Infectious Diseases (pager) [phone] [email or web address] 06/29/2013, 10:25 AM  LOS: 2 days

## 2013-06-29 NOTE — Discharge Summary (Signed)
Physician Discharge Summary  Theresa Pearson ZOX:[id number] DOB: January 08, 1966 DOA: 06/27/2013  PCP: No PCP Per Patient  Admit date: 06/27/2013 Discharge date: 06/29/2013  Time spent: >35 minutes  Recommendations for Outpatient Follow-up:  F/u with dermatologist in 1-2 days  F/u with wellness clinic next week  Discharge Diagnoses:  Principal Problem:   Rash Active Problems:   Hypokalemia   HTN (hypertension)   Sleep apnea   Skin rash   Discharge Condition: stable   Diet recommendation: heart healthy   Filed Weights   06/27/13 1455 06/27/13 2314  Weight: 108.041 kg (238 lb 3 oz) 107.548 kg (237 lb 1.6 oz)    History of present illness:  47 y/o female with PMH of HTN presented with generalized rash, and oral ulcers   Hospital Course:  1. Skin rash with oral lesions of unclear etiology; possible allergic urticarial reaction(green tea self brewing at home prior to rash) vs viral etiology; pend viral panel; no respiratory compromise; unlikely due to lisinopril;  -cont supportive care, steroid short course; d/w if not improving may need skin biopsy; recommended to f/u with dermatologist in 1-2 days  2. Hypo K likely diuretic induced; added potassium regimen; BMP in 1 week to f/u  3. HTN resume home regimen, titrate as needed outpatient     Procedures:  None  (i.e. Studies not automatically included, echos, thoracentesis, etc; not x-rays)  Consultations:  ID  Discharge Exam: Filed Vitals:   06/29/13 1030  BP:   Pulse: 85  Temp:   Resp:     General: alert Cardiovascular: s1,s2 rrr Respiratory: CTA BL  Discharge Instructions  Discharge Orders   Future Orders Complete By Expires   Diet - low sodium heart healthy  As directed    Discharge instructions  As directed    Comments:     Please follow up with dermatologist in 1-2 days   Increase activity slowly  As directed        Medication List         chlorthalidone 50 MG tablet  Commonly known as:  HYGROTON   Take 50 mg by mouth daily.     diphenhydrAMINE 25 mg capsule  Commonly known as:  BENADRYL  Take 25-50 mg by mouth 2 (two) times daily as needed for itching or allergies.     ferrous sulfate 325 (65 FE) MG tablet  Take 325 mg by mouth daily with breakfast.     lidocaine 2 % solution  Commonly known as:  XYLOCAINE  Use as directed 15 mLs in the mouth or throat every 6 (six) hours as needed for mouth pain.     lisinopril 40 MG tablet  Commonly known as:  PRINIVIL,ZESTRIL  Take 40 mg by mouth daily.     metoprolol 50 MG tablet  Commonly known as:  LOPRESSOR  Take 50 mg by mouth 2 (two) times daily.     MULTIVITAMIN PO  Take 1 tablet by mouth daily.     potassium chloride 10 MEQ tablet  Commonly known as:  K-DUR  Take 1 tablet (10 mEq total) by mouth daily.     predniSONE 10 MG tablet  Commonly known as:  DELTASONE  Take 2 tablets (20 mg total) by mouth daily with breakfast.       Allergies  Allergen Reactions  . Penicillins Shortness Of Breath and Rash       Follow-up Information   Follow up with [facility]. Schedule an appointment as soon as possible for a  visit in 1 day.   Specialty:  Dermatology   Contact information:   [address] [place] [address] [phone]      Follow up with [facility]     In 1 week.   Contact information:   [address] [place] [address] [phone]       The results of significant diagnostics from this hospitalization (including imaging, microbiology, ancillary and laboratory) are listed below for reference.    Significant Diagnostic Studies: No results found.  Microbiology: Recent Results (from the past 240 hour(s))  CULTURE, BLOOD (ROUTINE X 2)     Status: None   Collection Time    06/27/13 11:35 PM      Result Value Range Status   Specimen Description BLOOD RIGHT ANTECUBITAL   Final   Special Requests BOTTLES DRAWN AEROBIC ONLY 10CC   Final    Culture  Setup Time     Final   Value: 06/28/2013 09:36     Performed at [facility]   Culture     Final   Value:        BLOOD CULTURE RECEIVED NO GROWTH TO DATE CULTURE WILL BE HELD FOR 5 DAYS BEFORE ISSUING A FINAL NEGATIVE REPORT     Performed at [facility]   Report Status PENDING   Incomplete  CULTURE, BLOOD (ROUTINE X 2)     Status: None   Collection Time    06/27/13 11:40 PM      Result Value Range Status   Specimen Description BLOOD RIGHT HAND   Final   Special Requests BOTTLES DRAWN AEROBIC ONLY 4CC   Final   Culture  Setup Time     Final   Value: 06/28/2013 09:36     Performed at [facility]   Culture     Final   Value:        BLOOD CULTURE RECEIVED NO GROWTH TO DATE CULTURE WILL BE HELD FOR 5 DAYS BEFORE ISSUING A FINAL NEGATIVE REPORT     Performed at [facility]   Report Status PENDING   Incomplete     Labs: Basic Metabolic Panel:  Recent Labs Lab 06/27/13 1724 06/27/13 2335 06/28/13 0504 06/28/13 1100 06/29/13 0400  NA 138  --  140  --  137  K 2.6*  --  2.8* 3.2* 4.3  CL 97  --  101  --  105  CO2 30  --  26  --  25  GLUCOSE 84  --  87  --  94  BUN 14  --  10  --  8  CREATININE 0.72  --  0.64  --  0.72  CALCIUM 9.3  --  8.4  --  8.5  MG  --  1.9  --   --   --    Liver Function Tests: No results found for this basename: AST, ALT, ALKPHOS, BILITOT, PROT, ALBUMIN,  in the last 168 hours No results found for this basename: LIPASE, AMYLASE,  in the last 168 hours No results found for this basename: AMMONIA,  in the last 168 hours CBC:  Recent Labs Lab 06/27/13 1724 06/28/13 0504  WBC 11.9* 10.7*  NEUTROABS 8.7*  --   HGB 12.5 10.8*  HCT 37.2 32.9*  MCV 71.4* 71.4*  PLT 352 337   Cardiac Enzymes: No results found for this basename: CKTOTAL, CKMB, CKMBINDEX, TROPONINI,  in the last 168 hours BNP: BNP (last 3 results)  No results found for this basename: PROBNP,  in the last 8760 hours CBG: No results found for  this basename: GLUCAP,  in the last 168 hours     Signed:  Esperanza Sheets  [facility] 06/29/2013, 10:59 AM

## 2013-06-29 NOTE — Progress Notes (Signed)
Went over discharge instructions with the patient. Patient has no additional questions or concerns related to discharge. IV d/c'd, patient taken off cardiac monitor. Patient ready for discharge. Stanton Kidney R

## 2013-07-01 LAB — COXSACKIE B VIRUS ANTIBODIES
Coxsackie B1 Ab: <1:8 {titer}
Coxsackie B3 Ab: <1:8 {titer}
Coxsackie B4 Ab: <1:8 {titer}
Coxsackie B6 Ab: <1:8 {titer}

## 2013-07-01 LAB — FUNGAL ANTIBODIES PANEL, ID-BLOOD
Aspergillus Flavus Antibodies: NEGATIVE
Aspergillus Niger Antibodies: NEGATIVE
Aspergillus fumigatus: NEGATIVE
Blastomyces Abs, Qn, DID: NEGATIVE
Coccidioides Antibody ID: NEGATIVE

## 2013-07-02 LAB — COXSACKIE A VIRUS ANTIBODIES

## 2013-07-03 LAB — CULTURE, BLOOD (ROUTINE X 2)

## 2013-07-04 LAB — CULTURE, BLOOD (ROUTINE X 2): Culture: NO GROWTH

## 2013-07-06 ENCOUNTER — Encounter: Payer: Self-pay | Admitting: Internal Medicine

## 2013-07-06 ENCOUNTER — Ambulatory Visit: Payer: Self-pay | Attending: Internal Medicine | Admitting: Internal Medicine

## 2013-07-06 VITALS — BP 128/73 | HR 76 | Temp 98.7°F | Resp 14 | Ht 66.0 in | Wt 229.8 lb

## 2013-07-06 DIAGNOSIS — R21 Rash and other nonspecific skin eruption: Secondary | ICD-10-CM | POA: Insufficient documentation

## 2013-07-06 LAB — COMPLETE METABOLIC PANEL WITH GFR
Albumin: 3.9 g/dL (ref 3.5–5.2)
Alkaline Phosphatase: 69 U/L (ref 39–117)
BUN: 16 mg/dL (ref 6–23)
Creat: 0.85 mg/dL (ref 0.50–1.10)
GFR, Est African American: >89 mL/min
GFR, Est Non African American: 82 mL/min
Glucose, Bld: 91 mg/dL (ref 70–99)
Sodium: 138 mEq/L (ref 135–145)
Total Bilirubin: 0.4 mg/dL (ref 0.3–1.2)

## 2013-07-06 LAB — SEDIMENTATION RATE: Sed Rate: 32 mm/hr — ABNORMAL HIGH (ref 0–22)

## 2013-07-06 MED ORDER — PREDNISONE 10 MG PO TABS: 20.0000 mg | ORAL_TABLET | Freq: Every day | ORAL | Status: DC

## 2013-07-06 NOTE — Progress Notes (Signed)
Pt is here for a hospital f/u. Rash on Lt arm, chest and lbilateral legs; started in mouth x2 weeks. Pain in hands x2 weeks. Pt is a new pt.

## 2013-07-06 NOTE — Progress Notes (Signed)
Patient ID: Theresa Pearson, female   DOB: 05-Jul-1966, 47 y.o.   MRN: [id number]   CC:  HPI: 47 year old female here for followup of her rash. The rash is diffuse present on both arms, chest, back, trunk proximal thigh, started after having shrimp and green tea Extensive workup including a fungal, HIV, hepatitis, coxsackievirus, panel was negative. Patient thinks that she had chickenpox No varicella antibody titer was done She was treated with prednisone for 3 days She does complain of bilateral wrist pain and. Both her hands She had developed oral ulcerations which have since resolved  Allergies  Allergen Reactions  . Penicillins Shortness Of Breath and Rash   Past Medical History  Diagnosis Date  . Hypertension   . Right leg DVT   . Sleep apnea    Current Outpatient Prescriptions on File Prior to Visit  Medication Sig Dispense Refill  . chlorthalidone (HYGROTON) 50 MG tablet Take 50 mg by mouth daily.      . diphenhydrAMINE (BENADRYL) 25 mg capsule Take 25-50 mg by mouth 2 (two) times daily as needed for itching or allergies.      . ferrous sulfate 325 (65 FE) MG tablet Take 325 mg by mouth daily with breakfast.      . lidocaine (XYLOCAINE) 2 % solution Use as directed 15 mLs in the mouth or throat every 6 (six) hours as needed for mouth pain.  100 mL  0  . lisinopril (PRINIVIL,ZESTRIL) 40 MG tablet Take 40 mg by mouth daily.      . metoprolol (LOPRESSOR) 50 MG tablet Take 50 mg by mouth 2 (two) times daily.      . Multiple Vitamins-Minerals (MULTIVITAMIN PO) Take 1 tablet by mouth daily.      . potassium chloride (K-DUR) 10 MEQ tablet Take 1 tablet (10 mEq total) by mouth daily.  20 tablet  0   No current facility-administered medications on file prior to visit.   Family History  Problem Relation Age of Onset  . CAD Mother   . Diabetes Mellitus II Mother   . Stroke Mother   . Allergy (severe) Sister   . Allergy (severe) Son    History   Social History  . Marital Status:  Single    Spouse Name: N/A    Number of Children: N/A  . Years of Education: N/A   Occupational History  . Not on file.   Social History Main Topics  . Smoking status: Never Smoker   . Smokeless tobacco: Not on file  . Alcohol Use: Yes     Comment: occoasional.  . Drug Use: No  . Sexual Activity: Not on file   Other Topics Concern  . Not on file   Social History Narrative  . No narrative on file    Review of Systems  Constitutional: Negative for fever, chills, diaphoresis, activity change, appetite change and fatigue.  HENT: Negative for ear pain, nosebleeds, congestion, facial swelling, rhinorrhea, neck pain, neck stiffness and ear discharge.   Eyes: Negative for pain, discharge, redness, itching and visual disturbance.  Respiratory: Negative for cough, choking, chest tightness, shortness of breath, wheezing and stridor.   Cardiovascular: Negative for chest pain, palpitations and leg swelling.  Gastrointestinal: Negative for abdominal distention.  Genitourinary: Negative for dysuria, urgency, frequency, hematuria, flank pain, decreased urine volume, difficulty urinating and dyspareunia.  Musculoskeletal: Negative for back pain, joint swelling, arthralgias and gait problem.  Neurological: Negative for dizziness, tremors, seizures, syncope, facial asymmetry, speech difficulty, weakness, light-headedness, numbness and  headaches.  Hematological: Negative for adenopathy. Does not bruise/bleed easily.  Psychiatric/Behavioral: Negative for hallucinations, behavioral problems, confusion, dysphoric mood, decreased concentration and agitation.    Objective:   Filed Vitals:   07/06/13 1117  BP: 128/73  Pulse: 76  Temp: 98.7 F (37.1 C)  Resp: 14    Physical Exam  Constitutional: Appears well-developed and well-nourished. No distress.  HENT: Normocephalic. External right and left ear normal. Oropharynx is clear and moist.  Eyes: Conjunctivae and EOM are normal. PERRLA, no  scleral icterus.  Neck: Normal ROM. Neck supple. No JVD. No tracheal deviation. No thyromegaly.  CVS: RRR, S1/S2 +, no murmurs, no gallops, no carotid bruit.  Pulmonary: Effort and breath sounds normal, no stridor, rhonchi, wheezes, rales.  Abdominal: Soft. BS +,  no distension, tenderness, rebound or guarding.  Musculoskeletal: Normal range of motion. No edema and no tenderness.  Lymphadenopathy: No lymphadenopathy noted, cervical, inguinal. Neuro: Alert. Normal reflexes, muscle tone coordination. No cranial nerve deficit. Skin: Maculopapular rash bilateral upper extremities. No rash noted. Not diaphoretic. No erythema. No pallor.  Psychiatric: Normal mood and affect. Behavior, judgment, thought content normal.   Lab Results  Component Value Date   WBC 10.7* 06/28/2013   HGB 10.8* 06/28/2013   HCT 32.9* 06/28/2013   MCV 71.4* 06/28/2013   PLT 337 06/28/2013   Lab Results  Component Value Date   CREATININE 0.72 06/29/2013   BUN 8 06/29/2013   NA 137 06/29/2013   K 4.3 06/29/2013   CL 105 06/29/2013   CO2 25 06/29/2013    No results found for this basename: HGBA1C   Lipid Panel  No results found for this basename: chol, trig, hdl, cholhdl, vldl, ldlcalc       Assessment and plan:   Patient Active Problem List   Diagnosis Date Noted  . Hypokalemia 06/27/2013  . Rash 06/27/2013  . HTN (hypertension) 06/27/2013  . Sleep apnea 06/27/2013  . Skin rash 06/27/2013       Rash Will obtain varicella IgM, IgG Rheumatologic workup including ESR, and a double-stranded DNA, ANA was negative Oral ulcerations have since resolved Patient has been provided with another refill for prednisone in case the rash recurs Rheumatology referral Dermatology referral  Follow up in one month   The patient was given clear instructions to go to ER or return to medical center if symptoms don't improve, worsen or new problems develop. The patient verbalized understanding. The patient was told  to call to get any lab results if not heard anything in the next week.

## 2013-07-07 LAB — CYCLIC CITRUL PEPTIDE ANTIBODY, IGG: Cyclic Citrullin Peptide Ab: <2 U/mL (ref 0.0–5.0)

## 2013-07-07 LAB — ANTI-SCLERODERMA ANTIBODY: Scleroderma (Scl-70) (ENA) Antibody, IgG: 1 AU/mL (ref <30)

## 2013-07-07 LAB — ANTI-DNA ANTIBODY, DOUBLE-STRANDED: ds DNA Ab: 4 IU/mL (ref <30)

## 2013-07-13 ENCOUNTER — Telehealth: Payer: Self-pay | Admitting: *Deleted

## 2013-07-13 MED ORDER — POTASSIUM CHLORIDE ER 10 MEQ PO TBCR: 10.0000 meq | EXTENDED_RELEASE_TABLET | Freq: Two times a day (BID) | ORAL | Status: DC

## 2013-07-13 NOTE — Telephone Encounter (Signed)
Message copied by Sayuri Rhames, [place] R on Tue Jul 13, 2013  2:48 PM ------      Message from: Susie Cassette MD, [facility]      Created: Tue Jul 13, 2013  2:22 PM       Please notify patient of the patient's varicella immunoglobulin G antibody titer and elevated reflecting immunity. Potassium is low at 3.2 please prescribe K. Dur 10 meq twice a day for 10 days ------

## 2013-07-28 ENCOUNTER — Ambulatory Visit: Payer: Self-pay

## 2013-08-06 ENCOUNTER — Ambulatory Visit: Payer: Self-pay | Attending: Internal Medicine | Admitting: Internal Medicine

## 2013-08-06 ENCOUNTER — Encounter: Payer: Self-pay | Admitting: Internal Medicine

## 2013-08-06 VITALS — BP 161/97 | HR 68 | Temp 98.4°F | Resp 16 | Ht 66.0 in | Wt 239.0 lb

## 2013-08-06 DIAGNOSIS — E876 Hypokalemia: Secondary | ICD-10-CM

## 2013-08-06 DIAGNOSIS — I10 Essential (primary) hypertension: Secondary | ICD-10-CM

## 2013-08-06 DIAGNOSIS — R21 Rash and other nonspecific skin eruption: Secondary | ICD-10-CM

## 2013-08-06 LAB — POTASSIUM: POTASSIUM: 3.2 meq/L — AB (ref 3.5–5.3)

## 2013-08-06 MED ORDER — CHLORTHALIDONE 50 MG PO TABS: 50.0000 mg | ORAL_TABLET | Freq: Every day | ORAL | Status: DC

## 2013-08-06 MED ORDER — LISINOPRIL 40 MG PO TABS: 40.0000 mg | ORAL_TABLET | Freq: Every day | ORAL | Status: DC

## 2013-08-06 MED ORDER — POTASSIUM CHLORIDE ER 10 MEQ PO TBCR: 10.0000 meq | EXTENDED_RELEASE_TABLET | Freq: Two times a day (BID) | ORAL | Status: DC

## 2013-08-06 MED ORDER — METOPROLOL TARTRATE 50 MG PO TABS: 50.0000 mg | ORAL_TABLET | Freq: Two times a day (BID) | ORAL | Status: DC

## 2013-08-06 NOTE — Progress Notes (Unsigned)
Pt is here following up on her rash. Today the rash has mostly gone away.

## 2013-08-06 NOTE — Progress Notes (Unsigned)
Patient ID: Theresa Pearson, female   DOB: 1966/05/08, 48 y.o.   MRN: [id number] Patient Demographics  Theresa Pearson, is a 47 y.o. female  [id number]  [id number]  DOB - 10/02/1965  No chief complaint on file.       Subjective:   Theresa Pearson today is here for a follow up visit. Patient has No headache, No chest pain, No abdominal pain - No Nausea, No new weakness tingling or numbness, No Cough - SOB.  Patient is here for followup of the rash, the rash has mostly gone away  Objective:    Filed Vitals:   08/06/13 1051  BP: 161/97  Pulse: 68  Temp: 98.4 F (36.9 C)  TempSrc: Oral  Resp: 16  Height: 5\' 6"  (1.676 m)  Weight: 239 lb (108.41 kg)  SpO2: 98%     ALLERGIES:   Allergies  Allergen Reactions  . Penicillins Shortness Of Breath and Rash    PAST MEDICAL HISTORY: Past Medical History  Diagnosis Date  . Hypertension   . Right leg DVT   . Sleep apnea     MEDICATIONS AT HOME: Prior to Admission medications   Medication Sig Start Date End Date Taking? Authorizing Provider  chlorthalidone (HYGROTON) 50 MG tablet Take 1 tablet (50 mg total) by mouth daily. 08/06/13  Yes Ingra Rother Krystal Eaton, MD  ferrous sulfate 325 (65 FE) MG tablet Take 325 mg by mouth daily with breakfast.   Yes Historical Provider, MD  lisinopril (PRINIVIL,ZESTRIL) 40 MG tablet Take 1 tablet (40 mg total) by mouth daily. 08/06/13  Yes Rashaun Curl Krystal Eaton, MD  metoprolol (LOPRESSOR) 50 MG tablet Take 1 tablet (50 mg total) by mouth 2 (two) times daily. 08/06/13  Yes Mishell Donalson Krystal Eaton, MD  Multiple Vitamins-Minerals (MULTIVITAMIN PO) Take 1 tablet by mouth daily.   Yes Historical Provider, MD  potassium chloride (K-DUR) 10 MEQ tablet Take 1 tablet (10 mEq total) by mouth 2 (two) times daily. 08/06/13  Yes Aspynn Clover Krystal Eaton, MD  predniSONE (DELTASONE) 10 MG tablet Take 2 tablets (20 mg total) by mouth daily with breakfast. 07/06/13  Yes Reyne Dumas, MD  diphenhydrAMINE (BENADRYL) 25 mg capsule Take 25-50 mg  by mouth 2 (two) times daily as needed for itching or allergies.    Historical Provider, MD  lidocaine (XYLOCAINE) 2 % solution Use as directed 15 mLs in the mouth or throat every 6 (six) hours as needed for mouth pain. 06/29/13   Kinnie Feil, MD     Exam  General appearance :Awake, alert, NAD, Speech Clear.  HEENT: Atraumatic and Normocephalic, PERLA Neck: supple, no JVD. No cervical lymphadenopathy.  Chest: Clear to auscultation bilaterally, no wheezing, rales or rhonchi CVS: S1 S2 regular, no murmurs.  Abdomen: soft, NBS, NT, ND, no gaurding, rigidity or rebound. Extremities: no cyanosis or clubbing, B/L Lower Ext shows no edema Neurology: Awake alert, and oriented X 3, CN II-XII intact, Non focal Skin: No Rash or lesions Wounds:N/A    Data Review   Basic Metabolic Panel: No results found for this basename: NA, K, CL, CO2, GLUCOSE, BUN, CREATININE, CALCIUM, MG, PHOS,  in the last 168 hours Liver Function Tests: No results found for this basename: AST, ALT, ALKPHOS, BILITOT, PROT, ALBUMIN,  in the last 168 hours  CBC: No results found for this basename: WBC, NEUTROABS, HGB, HCT, MCV, PLT,  in the last 168 hours  ------------------------------------------------------------------------------------------------------------------ No results found for this basename: HGBA1C,  in the last 72 hours ------------------------------------------------------------------------------------------------------------------ No results found for  this basename: CHOL, HDL, LDLCALC, TRIG, CHOLHDL, LDLDIRECT,  in the last 72 hours ------------------------------------------------------------------------------------------------------------------ No results found for this basename: TSH, T4TOTAL, FREET3, T3FREE, THYROIDAB,  in the last 72 hours ------------------------------------------------------------------------------------------------------------------ No results found for this basename:  VITAMINB12, FOLATE, FERRITIN, TIBC, IRON, RETICCTPCT,  in the last 72 hours  Coagulation profile  No results found for this basename: INR, PROTIME,  in the last 168 hours    Assessment & Plan   Active Problems:   * No active hospital problems. *      Recommendations: Follow-up in     Xaiden Fleig M.D. 08/06/2013, 11:14 AM  Patient Demographics  Theresa Pearson, is a 48 y.o. female  [id number]  [id number]  DOB - [birth year]  No chief complaint on file.       Subjective:   Theresa Pearson today is here for a follow up visit. Patient has No headache, No chest pain, No abdominal pain - No Nausea, No new weakness tingling or numbness, No Cough - SOB. ********  Objective:    Filed Vitals:   08/06/13 1051  BP: 161/97  Pulse: 68  Temp: 98.4 F (36.9 C)  TempSrc: Oral  Resp: 16  Height: 5\' 6"  (1.676 m)  Weight: 239 lb (108.41 kg)  SpO2: 98%     ALLERGIES:   Allergies  Allergen Reactions  . Penicillins Shortness Of Breath and Rash    PAST MEDICAL HISTORY: Past Medical History  Diagnosis Date  . Hypertension   . Right leg DVT   . Sleep apnea     MEDICATIONS AT HOME: Prior to Admission medications   Medication Sig Start Date End Date Taking? Authorizing Provider  chlorthalidone (HYGROTON) 50 MG tablet Take 1 tablet (50 mg total) by mouth daily. 08/06/13  Yes Zaiyah Sottile Krystal Eaton, MD  ferrous sulfate 325 (65 FE) MG tablet Take 325 mg by mouth daily with breakfast.   Yes Historical Provider, MD  lisinopril (PRINIVIL,ZESTRIL) 40 MG tablet Take 1 tablet (40 mg total) by mouth daily. 08/06/13  Yes Chasady Longwell Krystal Eaton, MD  metoprolol (LOPRESSOR) 50 MG tablet Take 1 tablet (50 mg total) by mouth 2 (two) times daily. 08/06/13  Yes Britiny Defrain Krystal Eaton, MD  Multiple Vitamins-Minerals (MULTIVITAMIN PO) Take 1 tablet by mouth daily.   Yes Historical Provider, MD  potassium chloride (K-DUR) 10 MEQ tablet Take 1 tablet (10 mEq total) by mouth 2 (two) times daily. 08/06/13  Yes  Jasmyn Picha Krystal Eaton, MD  predniSONE (DELTASONE) 10 MG tablet Take 2 tablets (20 mg total) by mouth daily with breakfast. 07/06/13  Yes Reyne Dumas, MD  diphenhydrAMINE (BENADRYL) 25 mg capsule Take 25-50 mg by mouth 2 (two) times daily as needed for itching or allergies.    Historical Provider, MD  lidocaine (XYLOCAINE) 2 % solution Use as directed 15 mLs in the mouth or throat every 6 (six) hours as needed for mouth pain. 06/29/13   Kinnie Feil, MD     Exam  General appearance :Awake, alert, NAD, Speech Clear.  HEENT: Atraumatic and Normocephalic, PERLA Neck: supple, no JVD. No cervical lymphadenopathy.  Chest: Clear to auscultation bilaterally, no wheezing, rales or rhonchi CVS: S1 S2 regular, no murmurs.  Abdomen: soft, NBS, NT, ND, no gaurding, rigidity or rebound. Extremities: no cyanosis or clubbing, B/L Lower Ext shows no edema Neurology: Awake alert, and oriented X 3, CN II-XII intact, Non focal Skin: No Rash or lesions Wounds:N/A    Data Review   Basic Metabolic Panel: No results found for this basename:  NA, K, CL, CO2, GLUCOSE, BUN, CREATININE, CALCIUM, MG, PHOS,  in the last 168 hours Liver Function Tests: No results found for this basename: AST, ALT, ALKPHOS, BILITOT, PROT, ALBUMIN,  in the last 168 hours  CBC: No results found for this basename: WBC, NEUTROABS, HGB, HCT, MCV, PLT,  in the last 168 hours  ------------------------------------------------------------------------------------------------------------------ No results found for this basename: HGBA1C,  in the last 72 hours ------------------------------------------------------------------------------------------------------------------ No results found for this basename: CHOL, HDL, LDLCALC, TRIG, CHOLHDL, LDLDIRECT,  in the last 72 hours ------------------------------------------------------------------------------------------------------------------ No results found for this basename: TSH, T4TOTAL, FREET3,  T3FREE, THYROIDAB,  in the last 72 hours ------------------------------------------------------------------------------------------------------------------ No results found for this basename: VITAMINB12, FOLATE, FERRITIN, TIBC, IRON, RETICCTPCT,  in the last 72 hours  Coagulation profile  No results found for this basename: INR, PROTIME,  in the last 168 hours    Assessment & Plan   Active Problems: Rash: Resolved   Hypertension: Refilled metoprolol , lisinopril, chlorthalidone  Hypokalemia: - Recheck potassium level   Follow-up in3 months     Rithika Seel M.D. 08/06/2013, 11:14 AM

## 2013-08-11 ENCOUNTER — Ambulatory Visit: Payer: Self-pay

## 2013-10-26 ENCOUNTER — Ambulatory Visit: Admitting: Obstetrics & Gynecology

## 2013-10-26 ENCOUNTER — Encounter: Payer: Self-pay | Admitting: Obstetrics & Gynecology

## 2013-10-26 VITALS — BP 114/70 | Ht 64.0 in | Wt 114.0 lb

## 2013-10-26 DIAGNOSIS — D259 Leiomyoma of uterus, unspecified: Secondary | ICD-10-CM

## 2013-10-26 DIAGNOSIS — Z01419 Encounter for gynecological examination (general) (routine) without abnormal findings: Secondary | ICD-10-CM

## 2013-10-26 DIAGNOSIS — Z1239 Encounter for other screening for malignant neoplasm of breast: Secondary | ICD-10-CM

## 2013-10-26 DIAGNOSIS — N83209 Unspecified ovarian cyst, unspecified side: Secondary | ICD-10-CM

## 2013-10-26 LAB — POCT URINALYSIS DIPSTICK (5)

## 2013-10-26 NOTE — Progress Notes (Signed)
Subjective:       Elizabeth Dougherty is a 48 y.o. female here for a routine exam.  Current complaints: none.  Personal health questionnaire reviewed: yes.  Pt reports menses generally q month.  No d/c, dysuria, or dyspareunia.  Nl BM's.  Never had her f/u sono done last year to check her paraovarian cyst.  All three kids are doing well. Eldest and middle daughters are dancing, and son is playing baseball and soccer.     Gynecologic History  Patient's last menstrual period was 10/26/2013.  Contraception: vasectomy  Breast self exam: discussed  Common GYN tests  Last Pap Date: 07/24/11  Last Pap Result: Normal  Last Mammo Date: 06/10/12  Last Mammo Result: Normal    The following portions of the patient's history were reviewed and updated as appropriate: allergies, current medications, past family history, past medical history, past social history, past surgical history and problem list.      Review of Systems  Pertinent items are noted in HPI.      Objective:      BP 114/70  Ht 1.626 m (5\' 4" )  Wt 51.71 kg (114 lb)  BMI 19.56 kg/m2  LMP 10/26/2013  General appearance: alert, appears stated age and cooperative  Neck: no adenopathy, supple, symmetrical, trachea midline and thyroid not enlarged, symmetric, no tenderness/mass/nodules  Breasts: normal appearance, no masses or tenderness, No nipple retraction or dimpling, No nipple discharge or bleeding, No axillary or supraclavicular adenopathy  Abdomen: soft, non-tender; bowel sounds normal; no masses,  no organomegaly  Pelvic exam:     Urinary system: urethral meatus normal   External genitalia: normal general appearance   Vaginal: normal rugae   Cervix: normal appearance   Adnexa: normal bimanual exam   Uterus: normal single, nontender, enlarged, NT, palpable fibroids posteriorly?    Rectal: deferred          Assessment:      Healthy female exam.        Plan:      Mammogram ordered.  Follow up in: 1 year.  Thin prep pap/HPV deferred secondary to nl Pap in 2013 with  negative HR HPV  Pelvic sono referral given

## 2013-11-04 ENCOUNTER — Ambulatory Visit: Payer: Self-pay | Attending: Internal Medicine | Admitting: Internal Medicine

## 2013-11-04 ENCOUNTER — Encounter: Payer: Self-pay | Admitting: Internal Medicine

## 2013-11-04 VITALS — BP 130/85 | HR 72 | Temp 98.0°F | Resp 16 | Wt 238.2 lb

## 2013-11-04 DIAGNOSIS — Z139 Encounter for screening, unspecified: Secondary | ICD-10-CM | POA: Insufficient documentation

## 2013-11-04 DIAGNOSIS — E876 Hypokalemia: Secondary | ICD-10-CM | POA: Insufficient documentation

## 2013-11-04 DIAGNOSIS — I10 Essential (primary) hypertension: Secondary | ICD-10-CM | POA: Insufficient documentation

## 2013-11-04 DIAGNOSIS — R252 Cramp and spasm: Secondary | ICD-10-CM | POA: Insufficient documentation

## 2013-11-04 LAB — COMPLETE METABOLIC PANEL WITH GFR
ALBUMIN: 3.5 g/dL (ref 3.5–5.2)
ALT: 8 U/L (ref 0–35)
AST: 12 U/L (ref 0–37)
Alkaline Phosphatase: 60 U/L (ref 39–117)
BUN: 18 mg/dL (ref 6–23)
CALCIUM: 9 mg/dL (ref 8.4–10.5)
CHLORIDE: 103 meq/L (ref 96–112)
CO2: 30 meq/L (ref 19–32)
Creat: 0.7 mg/dL (ref 0.50–1.10)
GFR, Est Non African American: >89 mL/min
Glucose, Bld: 80 mg/dL (ref 70–99)
POTASSIUM: 3.3 meq/L — AB (ref 3.5–5.3)
SODIUM: 139 meq/L (ref 135–145)
TOTAL PROTEIN: 6.3 g/dL (ref 6.0–8.3)
Total Bilirubin: 0.3 mg/dL (ref 0.2–1.2)

## 2013-11-04 MED ORDER — POTASSIUM CHLORIDE ER 10 MEQ PO TBCR: 10.0000 meq | EXTENDED_RELEASE_TABLET | Freq: Two times a day (BID) | ORAL | Status: DC

## 2013-11-04 NOTE — Progress Notes (Signed)
MRN: [id number] Name: Theresa Pearson  Sex: female Age: 48 y.o. DOB: 08/07/1965  Allergies: Penicillins  Chief Complaint  Patient presents with  . Follow-up    HPI: Patient is 48 y.o. female who has to of hypertension hypokalemia comes today for followup reported to have right leg cramps, patient has been taking her potassium supplement, she denies any fever chills any redness or swelling in the leg. Her blood pressure is well controlled she is compliant in taking her medications.  Past Medical History  Diagnosis Date  . Hypertension   . Right leg DVT   . Sleep apnea     Past Surgical History  Procedure Laterality Date  . Cesarean section    . Abdominal hysterectomy    . Cholecystectomy        Medication List       This list is accurate as of: 11/04/13 11:17 AM.  Always use your most recent med list.               chlorthalidone 50 MG tablet  Commonly known as:  HYGROTON  Take 1 tablet (50 mg total) by mouth daily.     diphenhydrAMINE 25 mg capsule  Commonly known as:  BENADRYL  Take 25-50 mg by mouth 2 (two) times daily as needed for itching or allergies.     ferrous sulfate 325 (65 FE) MG tablet  Take 325 mg by mouth daily with breakfast.     lidocaine 2 % solution  Commonly known as:  XYLOCAINE  Use as directed 15 mLs in the mouth or throat every 6 (six) hours as needed for mouth pain.     lisinopril 40 MG tablet  Commonly known as:  PRINIVIL,ZESTRIL  Take 1 tablet (40 mg total) by mouth daily.     metoprolol 50 MG tablet  Commonly known as:  LOPRESSOR  Take 1 tablet (50 mg total) by mouth 2 (two) times daily.     MULTIVITAMIN PO  Take 1 tablet by mouth daily.     potassium chloride 10 MEQ tablet  Commonly known as:  K-DUR  Take 1 tablet (10 mEq total) by mouth 2 (two) times daily.     predniSONE 10 MG tablet  Commonly known as:  DELTASONE  Take 2 tablets (20 mg total) by mouth daily with breakfast.        Meds ordered this encounter    Medications  . potassium chloride (K-DUR) 10 MEQ tablet    Sig: Take 1 tablet (10 mEq total) by mouth 2 (two) times daily.    Dispense:  60 tablet    Refill:  3     There is no immunization history on file for this patient.  Family History  Problem Relation Age of Onset  . CAD Mother   . Diabetes Mellitus II Mother   . Stroke Mother   . Allergy (severe) Sister   . Allergy (severe) Son     History  Substance Use Topics  . Smoking status: Never Smoker   . Smokeless tobacco: Not on file  . Alcohol Use: Yes     Comment: occoasional.    Review of Systems   As noted in HPI  Filed Vitals:   11/04/13 1059  BP: 130/85  Pulse: 72  Temp: 98 F (36.7 C)  Resp: 16    Physical Exam  Physical Exam  Constitutional: No distress.  Eyes: EOM are normal. Pupils are equal, round, and reactive to light.  Cardiovascular: Normal rate  and regular rhythm.   Pulmonary/Chest: Breath sounds normal. No respiratory distress. She has no wheezes. She has no rales.  Musculoskeletal:  Right leg no swelling or redness or tenderness    CBC    Component Value Date/Time   WBC 10.7* 06/28/2013 0504   RBC 4.61 06/28/2013 0504   HGB 10.8* 06/28/2013 0504   HCT 32.9* 06/28/2013 0504   PLT 337 06/28/2013 0504   MCV 71.4* 06/28/2013 0504   LYMPHSABS 2.4 06/27/2013 1724   MONOABS 0.5 06/27/2013 1724   EOSABS 0.3 06/27/2013 1724   BASOSABS 0.0 06/27/2013 1724    CMP     Component Value Date/Time   NA 138 07/06/2013 1137   K 3.2* 08/06/2013 1117   CL 99 07/06/2013 1137   CO2 28 07/06/2013 1137   GLUCOSE 91 07/06/2013 1137   BUN 16 07/06/2013 1137   CREATININE 0.85 07/06/2013 1137   CREATININE 0.72 06/29/2013 0400   CALCIUM 9.8 07/06/2013 1137   PROT 6.9 07/06/2013 1137   ALBUMIN 3.9 07/06/2013 1137   AST 13 07/06/2013 1137   ALT 16 07/06/2013 1137   ALKPHOS 69 07/06/2013 1137   BILITOT 0.4 07/06/2013 1137   GFRNONAA 82 07/06/2013 1137   GFRNONAA >90 06/29/2013 0400   GFRAA >89  07/06/2013 1137   GFRAA >90 06/29/2013 0400    No results found for this basename: chol,  tri,  ldl    No components found with this basename: hga1c    Lab Results  Component Value Date/Time   AST 13 07/06/2013 11:37 AM    Assessment and Plan  HTN (hypertension) - Plan: Continue with current medications will repeat blood chemistry COMPLETE METABOLIC PANEL WITH GFR  Hypokalemia - Plan: potassium chloride (K-DUR) 10 MEQ tablet, repeat blood chemistry COMPLETE METABOLIC PANEL WITH GFR  Leg cramps - Plan: COMPLETE METABOLIC PANEL WITH GFR, will check Vit D  25 hydroxy (rtn osteoporosis monitoring)  Screening - Plan: MM DIGITAL SCREENING BILATERAL, Vit D  25 hydroxy (rtn osteoporosis monitoring)  Health Maintenance  -Mammogram: ordered    Return in about 4 months (around 03/06/2014) for hypertension.  Lorayne Marek, MD

## 2013-11-04 NOTE — Patient Instructions (Signed)
DASH Diet  The DASH diet stands for "Dietary Approaches to Stop Hypertension." It is a healthy eating plan that has been shown to reduce high blood pressure (hypertension) in as little as 14 days, while also possibly providing other significant health benefits. These other health benefits include reducing the risk of breast cancer after menopause and reducing the risk of type 2 diabetes, heart disease, colon cancer, and stroke. Health benefits also include weight loss and slowing kidney failure in patients with chronic kidney disease.   DIET GUIDELINES  · Limit salt (sodium). Your diet should contain less than 1500 mg of sodium daily.  · Limit refined or processed carbohydrates. Your diet should include mostly whole grains. Desserts and added sugars should be used sparingly.  · Include small amounts of heart-healthy fats. These types of fats include nuts, oils, and tub margarine. Limit saturated and trans fats. These fats have been shown to be harmful in the body.  CHOOSING FOODS   The following food groups are based on a 2000 calorie diet. See your Registered Dietitian for individual calorie needs.  Grains and Grain Products (6 to 8 servings daily)  · Eat More Often: Whole-wheat bread, brown rice, whole-grain or wheat pasta, quinoa, popcorn without added fat or salt (air popped).  · Eat Less Often: White bread, white pasta, white rice, cornbread.  Vegetables (4 to 5 servings daily)  · Eat More Often: Fresh, frozen, and canned vegetables. Vegetables may be raw, steamed, roasted, or grilled with a minimal amount of fat.  · Eat Less Often/Avoid: Creamed or fried vegetables. Vegetables in a cheese sauce.  Fruit (4 to 5 servings daily)  · Eat More Often: All fresh, canned (in natural juice), or frozen fruits. Dried fruits without added sugar. One hundred percent fruit juice (½ cup [237 mL] daily).  · Eat Less Often: Dried fruits with added sugar. Canned fruit in light or heavy syrup.  Lean Meats, Fish, and Poultry (2  servings or less daily. One serving is 3 to 4 oz [85-114 g]).  · Eat More Often: Ninety percent or leaner ground beef, tenderloin, sirloin. Round cuts of beef, chicken breast, turkey breast. All fish. Grill, bake, or broil your meat. Nothing should be fried.  · Eat Less Often/Avoid: Fatty cuts of meat, turkey, or chicken leg, thigh, or wing. Fried cuts of meat or fish.  Dairy (2 to 3 servings)  · Eat More Often: Low-fat or fat-free milk, low-fat plain or light yogurt, reduced-fat or part-skim cheese.  · Eat Less Often/Avoid: Milk (whole, 2%). Whole milk yogurt. Full-fat cheeses.  Nuts, Seeds, and Legumes (4 to 5 servings per week)  · Eat More Often: All without added salt.  · Eat Less Often/Avoid: Salted nuts and seeds, canned beans with added salt.  Fats and Sweets (limited)  · Eat More Often: Vegetable oils, tub margarines without trans fats, sugar-free gelatin. Mayonnaise and salad dressings.  · Eat Less Often/Avoid: Coconut oils, palm oils, butter, stick margarine, cream, half and half, cookies, candy, pie.  FOR MORE INFORMATION  The Dash Diet Eating Plan: www.dashdiet.org  Document Released: 06/20/2011 Document Revised: 09/23/2011 Document Reviewed: 06/20/2011  [employer]® Patient Information ©2014 [employer].

## 2013-11-04 NOTE — Progress Notes (Signed)
Patient here for follow up Would like her potassium check States she has "misery" to her right leg Right leg has an aching pain

## 2013-11-05 ENCOUNTER — Telehealth: Payer: Self-pay | Admitting: *Deleted

## 2013-11-05 LAB — VITAMIN D 25 HYDROXY (VIT D DEFICIENCY, FRACTURES): Vit D, 25-Hydroxy: 39 ng/mL (ref 30–89)

## 2013-11-05 NOTE — Telephone Encounter (Signed)
Pt is aware of her lab results.  

## 2013-11-05 NOTE — Telephone Encounter (Signed)
Message copied by Joan Mayans on Fri Nov 05, 2013 10:45 AM ------      Message from: Lorayne Marek      Created: Fri Nov 05, 2013  9:22 AM       Call and let the patient know that her potassium is still borderline low, continue with KCL 10 mEq twice a day as prescribed. ------

## 2013-11-12 ENCOUNTER — Encounter: Payer: Self-pay | Admitting: Obstetrics & Gynecology

## 2013-11-12 NOTE — Progress Notes (Signed)
Quick Note:    LM on CP. L paraovarian cyst is stable in size from last sono (6 cms). If she becomes symptomatic, we can schedule surgery to remove it.  ______

## 2013-11-29 ENCOUNTER — Encounter: Payer: Self-pay | Admitting: Obstetrics & Gynecology

## 2014-03-04 ENCOUNTER — Ambulatory Visit: Payer: Self-pay | Attending: Internal Medicine | Admitting: Internal Medicine

## 2014-03-04 ENCOUNTER — Encounter: Payer: Self-pay | Admitting: Internal Medicine

## 2014-03-04 ENCOUNTER — Ambulatory Visit (HOSPITAL_COMMUNITY)
Admission: RE | Admit: 2014-03-04 | Discharge: 2014-03-04 | Disposition: A | Discharge status: Home / Self Care | Payer: Self-pay | Source: Ambulatory Visit | Attending: Internal Medicine | Admitting: Internal Medicine

## 2014-03-04 VITALS — BP 138/90 | HR 67 | Temp 98.2°F | Resp 15 | Wt 238.2 lb

## 2014-03-04 DIAGNOSIS — E876 Hypokalemia: Secondary | ICD-10-CM | POA: Insufficient documentation

## 2014-03-04 DIAGNOSIS — I10 Essential (primary) hypertension: Secondary | ICD-10-CM | POA: Insufficient documentation

## 2014-03-04 DIAGNOSIS — Z139 Encounter for screening, unspecified: Secondary | ICD-10-CM

## 2014-03-04 DIAGNOSIS — Z90710 Acquired absence of both cervix and uterus: Secondary | ICD-10-CM | POA: Insufficient documentation

## 2014-03-04 DIAGNOSIS — F411 Generalized anxiety disorder: Secondary | ICD-10-CM | POA: Insufficient documentation

## 2014-03-04 DIAGNOSIS — R000 Tachycardia, unspecified: Secondary | ICD-10-CM | POA: Insufficient documentation

## 2014-03-04 DIAGNOSIS — Z9089 Acquired absence of other organs: Secondary | ICD-10-CM | POA: Insufficient documentation

## 2014-03-04 MED ORDER — LISINOPRIL 40 MG PO TABS: 40.0000 mg | ORAL_TABLET | Freq: Every day | ORAL | Status: DC

## 2014-03-04 MED ORDER — METOPROLOL TARTRATE 50 MG PO TABS: 50.0000 mg | ORAL_TABLET | Freq: Two times a day (BID) | ORAL | Status: DC

## 2014-03-04 MED ORDER — CITALOPRAM HYDROBROMIDE 10 MG PO TABS: 10.0000 mg | ORAL_TABLET | Freq: Every day | ORAL | Status: DC

## 2014-03-04 MED ORDER — POTASSIUM CHLORIDE ER 20 MEQ PO TBCR
20.0000 meq | EXTENDED_RELEASE_TABLET | Freq: Two times a day (BID) | ORAL | Status: DC
Start: 2014-03-04 — End: 2014-08-08

## 2014-03-04 NOTE — Progress Notes (Signed)
Patient states takes metoprolol and lately She feels her heart races more than normal with  Some palpatations This has been going on for about a week

## 2014-03-04 NOTE — Progress Notes (Signed)
MRN: [id number] Name: Theresa Pearson  Sex: female Age: 48 y.o. DOB: Jan 18, 1966  Allergies: Penicillins  Chief Complaint  Patient presents with  . Follow-up    HPI: Patient is 48 y.o. female who has history of hypertension palpitation comes today for followup reported some frequent palpitations last night, today her EKG done in the office is in normal sinus rhythm, she had severe symptoms in the past and also had a EKG done, she denies any chest pain or pressure, her thyroid level was normal in the past, currently taking metoprolol twice a day, she also thinks it can be secondary to anxiety symptoms as per patient she took a couple Celexa in the past which helped her with the symptoms and wants to resume back on that medication, denies any SI or HI, patient also has history of hypokalemia and is on chlorthalidone.  Past Medical History  Diagnosis Date  . Hypertension   . Right leg DVT   . Sleep apnea     Past Surgical History  Procedure Laterality Date  . Cesarean section    . Abdominal hysterectomy    . Cholecystectomy        Medication List       This list is accurate as of: 03/04/14 11:31 AM.  Always use your most recent med list.               chlorthalidone 50 MG tablet  Commonly known as:  HYGROTON  Take 1 tablet (50 mg total) by mouth daily.     citalopram 10 MG tablet  Commonly known as:  CELEXA  Take 1 tablet (10 mg total) by mouth daily.     diphenhydrAMINE 25 mg capsule  Commonly known as:  BENADRYL  Take 25-50 mg by mouth 2 (two) times daily as needed for itching or allergies.     ferrous sulfate 325 (65 FE) MG tablet  Take 325 mg by mouth daily with breakfast.     lidocaine 2 % solution  Commonly known as:  XYLOCAINE  Use as directed 15 mLs in the mouth or throat every 6 (six) hours as needed for mouth pain.     lisinopril 40 MG tablet  Commonly known as:  PRINIVIL,ZESTRIL  Take 1 tablet (40 mg total) by mouth daily.     metoprolol 50 MG  tablet  Commonly known as:  LOPRESSOR  Take 1 tablet (50 mg total) by mouth 2 (two) times daily.     MULTIVITAMIN PO  Take 1 tablet by mouth daily.     Potassium Chloride ER 20 MEQ Tbcr  Take 20 mEq by mouth 2 (two) times daily.     predniSONE 10 MG tablet  Commonly known as:  DELTASONE  Take 2 tablets (20 mg total) by mouth daily with breakfast.        Meds ordered this encounter  Medications  . potassium chloride 20 MEQ TBCR    Sig: Take 20 mEq by mouth 2 (two) times daily.    Dispense:  60 tablet    Refill:  3  . citalopram (CELEXA) 10 MG tablet    Sig: Take 1 tablet (10 mg total) by mouth daily.    Dispense:  30 tablet    Refill:  3     There is no immunization history on file for this patient.  Family History  Problem Relation Age of Onset  . CAD Mother   . Diabetes Mellitus II Mother   . Stroke Mother   .  Allergy (severe) Sister   . Allergy (severe) Son     History  Substance Use Topics  . Smoking status: Never Smoker   . Smokeless tobacco: Not on file  . Alcohol Use: Yes     Comment: occoasional.    Review of Systems   As noted in HPI  Filed Vitals:   03/04/14 1114  BP: 138/90  Pulse: 67  Temp: 98.2 F (36.8 C)  Resp: 15    Physical Exam  Physical Exam  Constitutional: No distress.  Eyes: EOM are normal. Pupils are equal, round, and reactive to light.  Cardiovascular: Normal rate and regular rhythm.   Pulmonary/Chest: Breath sounds normal. No respiratory distress. She has no wheezes. She has no rales.  Musculoskeletal: She exhibits no edema.    CBC    Component Value Date/Time   WBC 10.7* 06/28/2013 0504   RBC 4.61 06/28/2013 0504   HGB 10.8* 06/28/2013 0504   HCT 32.9* 06/28/2013 0504   PLT 337 06/28/2013 0504   MCV 71.4* 06/28/2013 0504   LYMPHSABS 2.4 06/27/2013 1724   MONOABS 0.5 06/27/2013 1724   EOSABS 0.3 06/27/2013 1724   BASOSABS 0.0 06/27/2013 1724    CMP     Component Value Date/Time   NA 139 11/04/2013 1119     K 3.3* 11/04/2013 1119   CL 103 11/04/2013 1119   CO2 30 11/04/2013 1119   GLUCOSE 80 11/04/2013 1119   BUN 18 11/04/2013 1119   CREATININE 0.70 11/04/2013 1119   CREATININE 0.72 06/29/2013 0400   CALCIUM 9.0 11/04/2013 1119   PROT 6.3 11/04/2013 1119   ALBUMIN 3.5 11/04/2013 1119   AST 12 11/04/2013 1119   ALT 8 11/04/2013 1119   ALKPHOS 60 11/04/2013 1119   BILITOT 0.3 11/04/2013 1119   GFRNONAA >89 11/04/2013 1119   GFRNONAA >90 06/29/2013 0400   GFRAA >89 11/04/2013 1119   GFRAA >90 06/29/2013 0400    No results found for this basename: chol, tri, ldl    No components found with this basename: hga1c    Lab Results  Component Value Date/Time   AST 12 11/04/2013 11:19 AM    Assessment and Plan  Racing heart beat - Plan: EKG 12-Lead unchanged, TSH level was normal, patient is already on beta blocker most likely secondary to anxiety, she's resume back on Celexa.  Essential hypertension - Plan: Blood pressure is well-controlled continued current meds COMPLETE METABOLIC PANEL WITH GFR  Hypokalemia - Plan: Continue with her potassium chloride 20 MEQ TBCR, will repeat COMPLETE METABOLIC PANEL WITH GFR  Anxiety state, unspecified - Plan: Started patient on low-dose citalopram (CELEXA) 10 MG tablet  Screening - Plan: MM DIGITAL SCREENING BILATERAL   Health Maintenance  -Mammogram: ordered   Return in about 4 months (around 07/04/2014) for hypertension, anxiety.  Lorayne Marek, MD

## 2014-03-05 LAB — COMPLETE METABOLIC PANEL WITH GFR
ALBUMIN: 3.6 g/dL (ref 3.5–5.2)
ALT: 10 U/L (ref 0–35)
AST: 15 U/L (ref 0–37)
Alkaline Phosphatase: 62 U/L (ref 39–117)
BUN: 19 mg/dL (ref 6–23)
CO2: 31 mEq/L (ref 19–32)
Calcium: 8.9 mg/dL (ref 8.4–10.5)
Chloride: 102 mEq/L (ref 96–112)
Creat: 0.72 mg/dL (ref 0.50–1.10)
GFR, Est African American: >89 mL/min
GLUCOSE: 86 mg/dL (ref 70–99)
Potassium: 3.3 mEq/L — ABNORMAL LOW (ref 3.5–5.3)
Sodium: 141 mEq/L (ref 135–145)
Total Bilirubin: 0.4 mg/dL (ref 0.2–1.2)
Total Protein: 6.7 g/dL (ref 6.0–8.3)

## 2014-03-09 ENCOUNTER — Telehealth: Payer: Self-pay

## 2014-03-09 NOTE — Telephone Encounter (Signed)
Message copied by Dorothe Pea on Wed Mar 09, 2014 11:23 AM ------      Message from: Lorayne Marek      Created: Mon Mar 07, 2014  9:25 AM       Call and let the patient know that her potassium is still borderline low, advise patient to take potassium supplements daily. ------

## 2014-03-09 NOTE — Telephone Encounter (Signed)
Patient is aware of her lab results 

## 2014-04-14 ENCOUNTER — Other Ambulatory Visit: Payer: Self-pay | Admitting: Internal Medicine

## 2014-04-15 ENCOUNTER — Other Ambulatory Visit: Payer: Self-pay

## 2014-04-15 ENCOUNTER — Telehealth: Payer: Self-pay

## 2014-04-15 MED ORDER — CHLORTHALIDONE 50 MG PO TABS: 50.0000 mg | ORAL_TABLET | Freq: Every day | ORAL | Status: DC

## 2014-04-15 NOTE — Telephone Encounter (Signed)
Tried to call patient to find out which [facility] to send her prescription  Went to her voice mail and mailbox was not set up Unable to leave message

## 2014-08-08 ENCOUNTER — Encounter: Payer: Self-pay | Admitting: Internal Medicine

## 2014-08-08 ENCOUNTER — Ambulatory Visit: Payer: Self-pay | Attending: Internal Medicine | Admitting: Internal Medicine

## 2014-08-08 VITALS — BP 122/79 | HR 80 | Temp 98.0°F | Resp 16 | Wt 247.0 lb

## 2014-08-08 DIAGNOSIS — Z1231 Encounter for screening mammogram for malignant neoplasm of breast: Secondary | ICD-10-CM

## 2014-08-08 DIAGNOSIS — Z8709 Personal history of other diseases of the respiratory system: Secondary | ICD-10-CM

## 2014-08-08 DIAGNOSIS — F411 Generalized anxiety disorder: Secondary | ICD-10-CM

## 2014-08-08 DIAGNOSIS — E876 Hypokalemia: Secondary | ICD-10-CM | POA: Insufficient documentation

## 2014-08-08 DIAGNOSIS — I10 Essential (primary) hypertension: Secondary | ICD-10-CM | POA: Insufficient documentation

## 2014-08-08 DIAGNOSIS — F419 Anxiety disorder, unspecified: Secondary | ICD-10-CM | POA: Insufficient documentation

## 2014-08-08 DIAGNOSIS — J45909 Unspecified asthma, uncomplicated: Secondary | ICD-10-CM | POA: Insufficient documentation

## 2014-08-08 LAB — COMPLETE METABOLIC PANEL WITH GFR
ALBUMIN: 3.9 g/dL (ref 3.5–5.2)
ALK PHOS: 62 U/L (ref 39–117)
ALT: 9 U/L (ref 0–35)
AST: 12 U/L (ref 0–37)
BILIRUBIN TOTAL: 0.3 mg/dL (ref 0.2–1.2)
BUN: 23 mg/dL (ref 6–23)
CALCIUM: 9.9 mg/dL (ref 8.4–10.5)
CO2: 25 mEq/L (ref 19–32)
CREATININE: 1.06 mg/dL (ref 0.50–1.10)
Chloride: 104 mEq/L (ref 96–112)
GFR, Est African American: 72 mL/min
GFR, Est Non African American: 63 mL/min
GLUCOSE: 104 mg/dL — AB (ref 70–99)
Potassium: 3.9 mEq/L (ref 3.5–5.3)
Sodium: 141 mEq/L (ref 135–145)
Total Protein: 7.3 g/dL (ref 6.0–8.3)

## 2014-08-08 MED ORDER — CHLORTHALIDONE 50 MG PO TABS: 50.0000 mg | ORAL_TABLET | Freq: Every day | ORAL | Status: DC

## 2014-08-08 MED ORDER — METOPROLOL TARTRATE 50 MG PO TABS: 50.0000 mg | ORAL_TABLET | Freq: Two times a day (BID) | ORAL | Status: DC

## 2014-08-08 MED ORDER — LISINOPRIL 40 MG PO TABS: 40.0000 mg | ORAL_TABLET | Freq: Every day | ORAL | Status: DC

## 2014-08-08 MED ORDER — ALBUTEROL SULFATE HFA 108 (90 BASE) MCG/ACT IN AERS: 2.0000 | INHALATION_SPRAY | Freq: Four times a day (QID) | RESPIRATORY_TRACT | Status: DC | PRN

## 2014-08-08 MED ORDER — POTASSIUM CHLORIDE ER 20 MEQ PO TBCR: 20.0000 meq | EXTENDED_RELEASE_TABLET | Freq: Two times a day (BID) | ORAL | Status: DC

## 2014-08-08 MED ORDER — CITALOPRAM HYDROBROMIDE 20 MG PO TABS: 20.0000 mg | ORAL_TABLET | Freq: Every day | ORAL | Status: DC

## 2014-08-08 NOTE — Progress Notes (Signed)
Patient here for follow up on her HTN Patient requesting an increase in her celexa because she is still having anxiety

## 2014-08-08 NOTE — Progress Notes (Signed)
MRN: [id number] Name: Theresa Pearson  Sex: female Age: 48 y.o. DOB: July 05, 1966  Allergies: Penicillins  Chief Complaint  Patient presents with  . Follow-up    HPI: Patient is 49 y.o. female who history of hypertension, anxiety, asthma, comes today for followup requesting refill on her medications, as per patient she has been taking Celexa 10 mg and thinks she needs to take her higher dose because she has symptoms of anxiety, she denies any acute symptoms denies any headache dizziness chest and shortness of breath, patient is due for mammogram, she has not done yet, also discussed about getting flu shot patient will think about it,patient denies smoking cigarettes, she's requesting refill on albuterol sometimes she used for wheezing.  Past Medical History  Diagnosis Date  . Hypertension   . Right leg DVT   . Sleep apnea     Past Surgical History  Procedure Laterality Date  . Cesarean section    . Abdominal hysterectomy    . Cholecystectomy        Medication List       This list is accurate as of: 08/08/14  3:07 PM.  Always use your most recent med list.               albuterol 108 (90 BASE) MCG/ACT inhaler  Commonly known as:  PROVENTIL HFA;VENTOLIN HFA  Inhale 2 puffs into the lungs every 6 (six) hours as needed for wheezing or shortness of breath.     chlorthalidone 50 MG tablet  Commonly known as:  HYGROTON  Take 1 tablet (50 mg total) by mouth daily.     citalopram 20 MG tablet  Commonly known as:  CELEXA  Take 1 tablet (20 mg total) by mouth daily.     diphenhydrAMINE 25 mg capsule  Commonly known as:  BENADRYL  Take 25-50 mg by mouth 2 (two) times daily as needed for itching or allergies.     ferrous sulfate 325 (65 FE) MG tablet  Take 325 mg by mouth daily with breakfast.     lidocaine 2 % solution  Commonly known as:  XYLOCAINE  Use as directed 15 mLs in the mouth or throat every 6 (six) hours as needed for mouth pain.     lisinopril 40 MG  tablet  Commonly known as:  PRINIVIL,ZESTRIL  Take 1 tablet (40 mg total) by mouth daily.     metoprolol 50 MG tablet  Commonly known as:  LOPRESSOR  Take 1 tablet (50 mg total) by mouth 2 (two) times daily.     MULTIVITAMIN PO  Take 1 tablet by mouth daily.     Potassium Chloride ER 20 MEQ Tbcr  Take 20 mEq by mouth 2 (two) times daily.     predniSONE 10 MG tablet  Commonly known as:  DELTASONE  Take 2 tablets (20 mg total) by mouth daily with breakfast.        Meds ordered this encounter  Medications  . chlorthalidone (HYGROTON) 50 MG tablet    Sig: Take 1 tablet (50 mg total) by mouth daily.    Dispense:  30 tablet    Refill:  2  . lisinopril (PRINIVIL,ZESTRIL) 40 MG tablet    Sig: Take 1 tablet (40 mg total) by mouth daily.    Dispense:  30 tablet    Refill:  2  . metoprolol (LOPRESSOR) 50 MG tablet    Sig: Take 1 tablet (50 mg total) by mouth 2 (two) times daily.  Dispense:  60 tablet    Refill:  3  . Potassium Chloride ER 20 MEQ TBCR    Sig: Take 20 mEq by mouth 2 (two) times daily.    Dispense:  60 tablet    Refill:  3  . citalopram (CELEXA) 20 MG tablet    Sig: Take 1 tablet (20 mg total) by mouth daily.    Dispense:  30 tablet    Refill:  3  . albuterol (PROVENTIL HFA;VENTOLIN HFA) 108 (90 BASE) MCG/ACT inhaler    Sig: Inhale 2 puffs into the lungs every 6 (six) hours as needed for wheezing or shortness of breath.    Dispense:  1 Inhaler    Refill:  3     There is no immunization history on file for this patient.  Family History  Problem Relation Age of Onset  . CAD Mother   . Diabetes Mellitus II Mother   . Stroke Mother   . Allergy (severe) Sister   . Allergy (severe) Son     History  Substance Use Topics  . Smoking status: Never Smoker   . Smokeless tobacco: Not on file  . Alcohol Use: Yes     Comment: occoasional.    Review of Systems   As noted in HPI  Filed Vitals:   08/08/14 1452  BP: 122/79  Pulse: 80  Temp: 98 F (36.7  C)  Resp: 16    Physical Exam  Physical Exam  Constitutional: No distress.  Eyes: Pupils are equal, round, and reactive to light.  Neck: Neck supple.  Cardiovascular: Normal rate and regular rhythm.   Pulmonary/Chest: Breath sounds normal. No respiratory distress. She has no wheezes. She has no rales.  Musculoskeletal: She exhibits no edema.    CBC    Component Value Date/Time   WBC 10.7* 06/28/2013 0504   RBC 4.61 06/28/2013 0504   HGB 10.8* 06/28/2013 0504   HCT 32.9* 06/28/2013 0504   PLT 337 06/28/2013 0504   MCV 71.4* 06/28/2013 0504   LYMPHSABS 2.4 06/27/2013 1724   MONOABS 0.5 06/27/2013 1724   EOSABS 0.3 06/27/2013 1724   BASOSABS 0.0 06/27/2013 1724    CMP     Component Value Date/Time   NA 141 03/04/2014 1133   K 3.3* 03/04/2014 1133   CL 102 03/04/2014 1133   CO2 31 03/04/2014 1133   GLUCOSE 86 03/04/2014 1133   BUN 19 03/04/2014 1133   CREATININE 0.72 03/04/2014 1133   CREATININE 0.72 06/29/2013 0400   CALCIUM 8.9 03/04/2014 1133   PROT 6.7 03/04/2014 1133   ALBUMIN 3.6 03/04/2014 1133   AST 15 03/04/2014 1133   ALT 10 03/04/2014 1133   ALKPHOS 62 03/04/2014 1133   BILITOT 0.4 03/04/2014 1133   GFRNONAA >89 03/04/2014 1133   GFRNONAA >90 06/29/2013 0400   GFRAA >89 03/04/2014 1133   GFRAA >90 06/29/2013 0400    No results found for: CHOL  No components found for: HGA1C  Lab Results  Component Value Date/Time   AST 15 03/04/2014 11:33 AM    Assessment and Plan  Hypokalemia - Plan: Potassium Chloride ER 20 MEQ TBCR, recheckCOMPLETE METABOLIC PANEL WITH GFR  Anxiety state - Plan: have increased the dose of Celexa ,citalopram (CELEXA) 20 MG tablet  Encounter for screening mammogram for breast cancer - Plan: MM DIGITAL SCREENING BILATERAL  Essential hypertension - Plan:will repeat blood chemistry, refill on the medications done, blood pressure is well controlled,   chlorthalidone (HYGROTON) 50 MG tablet, lisinopril (PRINIVIL,ZESTRIL)  40  MG tablet, metoprolol (LOPRESSOR) 50 MG tablet, COMPLETE METABOLIC PANEL WITH GFR  History of asthma - Plan: albuterol (PROVENTIL HFA;VENTOLIN HFA) 108 (90 BASE) MCG/ACT inhaler When necessary   Health Maintenance  -Mammogram: ordered  -Vaccinations:  Patient will think about flu shot   Return in about 3 months (around 11/07/2014) for hypertension.  Lorayne Marek, MD

## 2014-08-09 ENCOUNTER — Telehealth: Payer: Self-pay | Admitting: *Deleted

## 2014-08-09 NOTE — Telephone Encounter (Signed)
Left voice message with normal labs If any question return call

## 2014-08-09 NOTE — Telephone Encounter (Signed)
-----   Message from Lorayne Marek, MD sent at 08/09/2014  9:26 AM EST ----- Call and let the patient know that her potassium level is in normal range.

## 2014-09-21 ENCOUNTER — Ambulatory Visit: Payer: Self-pay | Attending: Internal Medicine

## 2014-09-21 NOTE — Progress Notes (Unsigned)
Patient walked in to acute care clinic complaining of sore in her mouth.  Upon examination, it appears to be a canker sore to the inside left buccal mucosa.  Advised oatient  To gargle for 30 seconds bid with salt water and to use ambesol as needed for discomfort.  She was instructed to f.u with Dr. Annitta Needs as needed.  Patient  Also verbalized a lot of psycho-social stressors in her life at this time.  Patient was given handout on mental health walk in clinics in the community.  She was advised To contact them to see about an appointment.

## 2014-09-26 ENCOUNTER — Other Ambulatory Visit: Payer: Self-pay

## 2014-09-26 DIAGNOSIS — Z8709 Personal history of other diseases of the respiratory system: Secondary | ICD-10-CM

## 2014-09-26 MED ORDER — ALBUTEROL SULFATE HFA 108 (90 BASE) MCG/ACT IN AERS: 2.0000 | INHALATION_SPRAY | Freq: Four times a day (QID) | RESPIRATORY_TRACT | Status: DC | PRN

## 2014-10-10 ENCOUNTER — Ambulatory Visit: Payer: Self-pay

## 2014-11-15 ENCOUNTER — Encounter: Payer: Self-pay | Admitting: Obstetrics & Gynecology

## 2014-11-15 ENCOUNTER — Ambulatory Visit: Admitting: Obstetrics & Gynecology

## 2014-11-15 VITALS — BP 110/70 | Ht 64.0 in | Wt 114.0 lb

## 2014-11-15 DIAGNOSIS — Z01419 Encounter for gynecological examination (general) (routine) without abnormal findings: Secondary | ICD-10-CM

## 2014-11-15 DIAGNOSIS — Z1151 Encounter for screening for human papillomavirus (HPV): Secondary | ICD-10-CM

## 2014-11-15 DIAGNOSIS — Z1231 Encounter for screening mammogram for malignant neoplasm of breast: Secondary | ICD-10-CM

## 2014-11-15 LAB — POCT URINALYSIS DIPSTICK (5)

## 2014-11-15 NOTE — Addendum Note (Signed)
Addended by: Dorita Sciara on: 11/15/2014 01:49 PM     Modules accepted: Orders

## 2014-11-15 NOTE — Progress Notes (Signed)
Subjective:       Elizabeth Dougherty is a 49 y.o. female here for a routine exam.  Current complaints: none.  Personal health questionnaire reviewed: yes.  Pt reports nl menses q month.  No d/c, dysuria, or dyspareunia.  Nl BM's.  No incontinence.  All 3 kids are doing well.  Having more PMS issues.  Pt had a f/u sono last year which showed a stable 5 cm paraovarian cyst.     Gynecologic History  Patient's last menstrual period was 11/04/2014.  Contraception: vasectomy  Breast self exam: discussed  Common GYN tests  Last Pap Date: 07/24/11  Last Pap Result: Normal  Last Mammo Date: 12/30/13  Last Mammo Result: Normal    The following portions of the patient's history were reviewed and updated as appropriate: allergies, current medications, past family history, past medical history, past social history, past surgical history and problem list.      Review of Systems  Pertinent items are noted in HPI.      Objective:      BP 110/70 mmHg  Ht 1.626 m (5\' 4" )  Wt 51.71 kg (114 lb)  BMI 19.56 kg/m2  LMP 11/04/2014  General appearance: alert, appears stated age and cooperative  Neck: no adenopathy, supple, symmetrical, trachea midline and thyroid not enlarged, symmetric, no tenderness/mass/nodules  Breasts: normal appearance, no masses or tenderness, No nipple retraction or dimpling, No nipple discharge or bleeding, No axillary or supraclavicular adenopathy  Abdomen: soft, non-tender; bowel sounds normal; no masses,  no organomegaly  Pelvic exam:     Urinary system: urethral meatus normal   External genitalia: normal general appearance   Vaginal: normal rugae   Cervix: normal appearance   Adnexa: normal bimanual exam   Uterus: normal single, nontender   Rectal: deferred          Assessment:      Healthy female exam.        Plan:      Mammogram ordered.  Follow up in: 1 year.  Thin prep pap/HPV Yes    Discussed new ACS guidelines and 3D mammography.  Recommended Ca2+ and exercise for PMS sx.  Also discussed an SSRI if her  sx get worse.

## 2014-11-18 LAB — IMAGE GUIDED PAP WITH APTIMA HPV TP
.: 0
HPV APTIMA: NEGATIVE

## 2014-11-18 NOTE — Progress Notes (Signed)
Quick Note:    ASCUS Pap with negative HR HPV. Repeat Pap in 1 year.  ______

## 2014-12-13 ENCOUNTER — Other Ambulatory Visit: Payer: Self-pay | Admitting: Internal Medicine

## 2014-12-15 ENCOUNTER — Telehealth: Payer: Self-pay | Admitting: Internal Medicine

## 2014-12-15 NOTE — Telephone Encounter (Signed)
Patient called requesting a letter stating that patient is cleared to drive and that we are aware of heart murmur. 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  [place] [phone] Please fax letter

## 2014-12-15 NOTE — Telephone Encounter (Signed)
Pt called requesting to speak to nurse regarding letter for DOT physical to be sent. Patient states they are only giving her until tomorrow, explained to patient that it can take up to 2 weeks. Please f/u with patient

## 2014-12-16 ENCOUNTER — Telehealth: Payer: Self-pay

## 2014-12-16 DIAGNOSIS — R011 Cardiac murmur, unspecified: Secondary | ICD-10-CM

## 2014-12-16 NOTE — Telephone Encounter (Signed)
Spoke with patient in regards to her request for clearance on her heart murmur Per Dr Annitta Needs he would like patient to have an echocardiogram i explained to patient that i would call and set up the appointment and get back To her with the date and time

## 2014-12-16 NOTE — Telephone Encounter (Signed)
Patient called requesting a letter stating that you are aware of her heart murmur And is capable of working(truck driver). i tried to explain to her she would need clearance from  Her cardiologist  But she insisted this document should come from her primary doctor Please advise

## 2014-12-16 NOTE — Telephone Encounter (Signed)
Patient is aware of her echo appointment that is scheduled for  June 8th at 11am Patient to arrive fifteen minutes prior to register

## 2014-12-16 NOTE — Telephone Encounter (Signed)
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 She needs a brief statement stating that we are aware of her heart murmurs and that this should not effect her ability to operate a vehicle. Please follow up with pt. She is needing this letter to be sent by fax today.

## 2014-12-16 NOTE — Telephone Encounter (Signed)
I haven't seen patient having any echocardiogram done, patient needs to get echo done which can suggest why she has a murmur if she has any heart  valve problem, order  echo and advise patient to get it done.

## 2014-12-21 ENCOUNTER — Ambulatory Visit (HOSPITAL_COMMUNITY)
Admission: RE | Admit: 2014-12-21 | Discharge: 2014-12-21 | Disposition: A | Discharge status: Home / Self Care | Payer: BLUE CROSS/BLUE SHIELD | Source: Ambulatory Visit | Attending: Internal Medicine | Admitting: Internal Medicine

## 2014-12-21 ENCOUNTER — Other Ambulatory Visit: Payer: Self-pay

## 2014-12-21 DIAGNOSIS — R011 Cardiac murmur, unspecified: Secondary | ICD-10-CM

## 2014-12-21 DIAGNOSIS — I071 Rheumatic tricuspid insufficiency: Secondary | ICD-10-CM | POA: Insufficient documentation

## 2014-12-21 DIAGNOSIS — I10 Essential (primary) hypertension: Secondary | ICD-10-CM

## 2014-12-21 MED ORDER — CHLORTHALIDONE 50 MG PO TABS: 50.0000 mg | ORAL_TABLET | Freq: Every day | ORAL | Status: DC

## 2014-12-21 MED ORDER — LISINOPRIL 40 MG PO TABS: 40.0000 mg | ORAL_TABLET | Freq: Every day | ORAL | Status: DC

## 2014-12-21 NOTE — Progress Notes (Signed)
  Echocardiogram 2D Echocardiogram has been performed.  Jennette Dubin 12/21/2014, 12:25 PM

## 2014-12-21 NOTE — Progress Notes (Unsigned)
Patient came into office requesting refills on her blood pressure medications [facility]

## 2014-12-22 ENCOUNTER — Telehealth: Payer: Self-pay

## 2014-12-22 NOTE — Telephone Encounter (Signed)
Patient is aware of her echo results Patient is requesting a note from her primary to say she is ok to Drive Is this something we can write for her

## 2014-12-22 NOTE — Telephone Encounter (Signed)
-----   Message from Lorayne Marek, MD sent at 12/22/2014  1:06 PM EDT ----- Call and let the patient know that her echocardiogram reports normal EF, has mild tricuspid regurgitation.  Impressions:  - Normal biventricular size and function. Trivial mitral and midl tricuspid regurgitation.

## 2014-12-23 NOTE — Telephone Encounter (Signed)
Yes she can be given the note if the concern was the murmur  Which is due to mild valve regurgitation.

## 2014-12-28 ENCOUNTER — Telehealth: Payer: Self-pay

## 2014-12-28 ENCOUNTER — Telehealth: Payer: Self-pay | Admitting: Internal Medicine

## 2014-12-28 ENCOUNTER — Other Ambulatory Visit: Payer: Self-pay

## 2014-12-28 NOTE — Telephone Encounter (Signed)
Patient called to check on the status of the letter she requested to be cleared to drive due to her heart murmur, please f/u

## 2014-12-28 NOTE — Telephone Encounter (Signed)
Patient is aware her letter is ready and will be at the front desk for pick up

## 2014-12-28 NOTE — Telephone Encounter (Signed)
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

## 2015-01-09 ENCOUNTER — Ambulatory Visit: Payer: BLUE CROSS/BLUE SHIELD | Admitting: Internal Medicine

## 2015-01-17 ENCOUNTER — Ambulatory Visit: Payer: BLUE CROSS/BLUE SHIELD | Admitting: Internal Medicine

## 2015-01-24 NOTE — Telephone Encounter (Signed)
Pt has been notified of Letter and will pick it up

## 2015-02-17 ENCOUNTER — Ambulatory Visit: Payer: BLUE CROSS/BLUE SHIELD | Attending: Internal Medicine | Admitting: Internal Medicine

## 2015-02-17 ENCOUNTER — Encounter: Payer: Self-pay | Admitting: Internal Medicine

## 2015-02-17 VITALS — BP 118/81 | HR 76 | Temp 98.0°F | Resp 16 | Wt 253.2 lb

## 2015-02-17 DIAGNOSIS — E876 Hypokalemia: Secondary | ICD-10-CM

## 2015-02-17 DIAGNOSIS — F411 Generalized anxiety disorder: Secondary | ICD-10-CM | POA: Diagnosis not present

## 2015-02-17 DIAGNOSIS — I10 Essential (primary) hypertension: Secondary | ICD-10-CM

## 2015-02-17 DIAGNOSIS — Z7952 Long term (current) use of systemic steroids: Secondary | ICD-10-CM | POA: Insufficient documentation

## 2015-02-17 DIAGNOSIS — Z79899 Other long term (current) drug therapy: Secondary | ICD-10-CM | POA: Insufficient documentation

## 2015-02-17 DIAGNOSIS — R21 Rash and other nonspecific skin eruption: Secondary | ICD-10-CM | POA: Diagnosis not present

## 2015-02-17 LAB — COMPLETE METABOLIC PANEL WITH GFR
ALT: 8 U/L (ref 6–29)
AST: 11 U/L (ref 10–35)
Albumin: 3.4 g/dL — ABNORMAL LOW (ref 3.6–5.1)
Alkaline Phosphatase: 61 U/L (ref 33–115)
BILIRUBIN TOTAL: 0.3 mg/dL (ref 0.2–1.2)
BUN: 18 mg/dL (ref 7–25)
CHLORIDE: 103 mmol/L (ref 98–110)
CO2: 30 mmol/L (ref 20–31)
Calcium: 9.4 mg/dL (ref 8.6–10.2)
Creat: 0.72 mg/dL (ref 0.50–1.10)
GFR, Est African American: >89 mL/min (ref >=60)
GFR, Est Non African American: >89 mL/min (ref >=60)
Glucose, Bld: 89 mg/dL (ref 65–99)
Potassium: 3.4 mmol/L — ABNORMAL LOW (ref 3.5–5.3)
Sodium: 142 mmol/L (ref 135–146)
TOTAL PROTEIN: 6.6 g/dL (ref 6.1–8.1)

## 2015-02-17 MED ORDER — LISINOPRIL 40 MG PO TABS: 40.0000 mg | ORAL_TABLET | Freq: Every day | ORAL | Status: DC

## 2015-02-17 MED ORDER — METOPROLOL TARTRATE 50 MG PO TABS: 50.0000 mg | ORAL_TABLET | Freq: Two times a day (BID) | ORAL | Status: DC

## 2015-02-17 MED ORDER — CHLORTHALIDONE 50 MG PO TABS: 50.0000 mg | ORAL_TABLET | Freq: Every day | ORAL | Status: DC

## 2015-02-17 MED ORDER — CITALOPRAM HYDROBROMIDE 40 MG PO TABS: 40.0000 mg | ORAL_TABLET | Freq: Every day | ORAL | Status: DC

## 2015-02-17 MED ORDER — HYDROCORTISONE 2.5 % EX CREA: TOPICAL_CREAM | Freq: Two times a day (BID) | CUTANEOUS | Status: DC

## 2015-02-17 MED ORDER — POTASSIUM CHLORIDE ER 20 MEQ PO TBCR: 20.0000 meq | EXTENDED_RELEASE_TABLET | Freq: Two times a day (BID) | ORAL | Status: DC

## 2015-02-17 MED ORDER — METHYLPREDNISOLONE 4 MG PO TBPK: ORAL_TABLET | ORAL | Status: DC

## 2015-02-17 NOTE — Patient Instructions (Signed)
DASH Eating Plan °DASH stands for "Dietary Approaches to Stop Hypertension." The DASH eating plan is a healthy eating plan that has been shown to reduce high blood pressure (hypertension). Additional health benefits may include reducing the risk of type 2 diabetes mellitus, heart disease, and stroke. The DASH eating plan may also help with weight loss. °WHAT DO I NEED TO KNOW ABOUT THE DASH EATING PLAN? °For the DASH eating plan, you will follow these general guidelines: °· Choose foods with a percent daily value for sodium of less than 5% (as listed on the food label). °· Use salt-free seasonings or herbs instead of table salt or sea salt. °· Check with your health care provider or pharmacist before using salt substitutes. °· Eat lower-sodium products, often labeled as "lower sodium" or "no salt added." °· Eat fresh foods. °· Eat more vegetables, fruits, and low-fat dairy products. °· Choose whole grains. Look for the word "whole" as the first word in the ingredient list. °· Choose fish and skinless chicken or turkey more often than red meat. Limit fish, poultry, and meat to 6 oz (170 g) each day. °· Limit sweets, desserts, sugars, and sugary drinks. °· Choose heart-healthy fats. °· Limit cheese to 1 oz (28 g) per day. °· Eat more home-cooked food and less restaurant, buffet, and fast food. °· Limit fried foods. °· Cook foods using methods other than frying. °· Limit canned vegetables. If you do use them, rinse them well to decrease the sodium. °· When eating at a restaurant, ask that your food be prepared with less salt, or no salt if possible. °WHAT FOODS CAN I EAT? °Seek help from a dietitian for individual calorie needs. °Grains °Whole grain or whole wheat bread. Brown rice. Whole grain or whole wheat pasta. Quinoa, bulgur, and whole grain cereals. Low-sodium cereals. Corn or whole wheat flour tortillas. Whole grain cornbread. Whole grain crackers. Low-sodium crackers. °Vegetables °Fresh or frozen vegetables  (raw, steamed, roasted, or grilled). Low-sodium or reduced-sodium tomato and vegetable juices. Low-sodium or reduced-sodium tomato sauce and paste. Low-sodium or reduced-sodium canned vegetables.  °Fruits °All fresh, canned (in natural juice), or frozen fruits. °Meat and Other Protein Products °Ground beef (85% or leaner), grass-fed beef, or beef trimmed of fat. Skinless chicken or turkey. Ground chicken or turkey. Pork trimmed of fat. All fish and seafood. Eggs. Dried beans, peas, or lentils. Unsalted nuts and seeds. Unsalted canned beans. °Dairy °Low-fat dairy products, such as skim or 1% milk, 2% or reduced-fat cheeses, low-fat ricotta or cottage cheese, or plain low-fat yogurt. Low-sodium or reduced-sodium cheeses. °Fats and Oils °Tub margarines without trans fats. Light or reduced-fat mayonnaise and salad dressings (reduced sodium). Avocado. Safflower, olive, or canola oils. Natural peanut or almond butter. °Other °Unsalted popcorn and pretzels. °The items listed above may not be a complete list of recommended foods or beverages. Contact your dietitian for more options. °WHAT FOODS ARE NOT RECOMMENDED? °Grains °White bread. White pasta. White rice. Refined cornbread. Bagels and croissants. Crackers that contain trans fat. °Vegetables °Creamed or fried vegetables. Vegetables in a cheese sauce. Regular canned vegetables. Regular canned tomato sauce and paste. Regular tomato and vegetable juices. °Fruits °Dried fruits. Canned fruit in light or heavy syrup. Fruit juice. °Meat and Other Protein Products °Fatty cuts of meat. Ribs, chicken wings, bacon, sausage, bologna, salami, chitterlings, fatback, hot dogs, bratwurst, and packaged luncheon meats. Salted nuts and seeds. Canned beans with salt. °Dairy °Whole or 2% milk, cream, half-and-half, and cream cheese. Whole-fat or sweetened yogurt. Full-fat   cheeses or blue cheese. Nondairy creamers and whipped toppings. Processed cheese, cheese spreads, or cheese  curds. °Condiments °Onion and garlic salt, seasoned salt, table salt, and sea salt. Canned and packaged gravies. Worcestershire sauce. Tartar sauce. Barbecue sauce. Teriyaki sauce. Soy sauce, including reduced sodium. Steak sauce. Fish sauce. Oyster sauce. Cocktail sauce. Horseradish. Ketchup and mustard. Meat flavorings and tenderizers. Bouillon cubes. Hot sauce. Tabasco sauce. Marinades. Taco seasonings. Relishes. °Fats and Oils °Butter, stick margarine, lard, shortening, ghee, and bacon fat. Coconut, palm kernel, or palm oils. Regular salad dressings. °Other °Pickles and olives. Salted popcorn and pretzels. °The items listed above may not be a complete list of foods and beverages to avoid. Contact your dietitian for more information. °WHERE CAN I FIND MORE INFORMATION? °[employer], [employer], [employer]: www.nhlbi.nih.gov/health/health-topics/topics/dash/ °Document Released: 06/20/2011 Document Revised: 11/15/2013 Document Reviewed: 05/05/2013 °ExitCare® Patient Information ©2015 [employer]. This information is not intended to replace advice given to you by your health care provider. Make sure you discuss any questions you have with your health care provider. ° °

## 2015-02-17 NOTE — Progress Notes (Signed)
Patient complains of rash to her torso and arms that started about a week ago Patient states the red "bumps" are very itchy Patient had taken a benadryl last evening

## 2015-02-17 NOTE — Progress Notes (Signed)
MRN: [id number] Name: Theresa Pearson  Sex: female Age: 49 y.o. DOB: 07-Apr-1966  Allergies: Penicillins  Chief Complaint  Patient presents with  . Rash    HPI: Patient is 49 y.o. female who has history of hypertension, anxiety, comes today for followup also complaining of itchy rash on her right arm, back for the last one week, she denies any change in soap detergent, new food or new medications, denies any recent travel, denies any fever chills chest pain shortness of breath, patient has tried over-the-counter Benadryl with some improvement in the symptoms, patient is also requesting refill on her medications, for anxiety she has been taking Celexa 20 mg, she requests that it the dose can be increased, she has been tolerating the medication without any side effects.  Past Medical History  Diagnosis Date  . Hypertension   . Right leg DVT   . Sleep apnea     Past Surgical History  Procedure Laterality Date  . Cesarean section    . Abdominal hysterectomy    . Cholecystectomy        Medication List       This list is accurate as of: 02/17/15 11:08 AM.  Always use your most recent med list.               albuterol 108 (90 BASE) MCG/ACT inhaler  Commonly known as:  PROVENTIL HFA;VENTOLIN HFA  Inhale 2 puffs into the lungs every 6 (six) hours as needed for wheezing or shortness of breath.     chlorthalidone 50 MG tablet  Commonly known as:  HYGROTON  Take 1 tablet (50 mg total) by mouth daily.     citalopram 40 MG tablet  Commonly known as:  CELEXA  Take 1 tablet (40 mg total) by mouth daily.     diphenhydrAMINE 25 mg capsule  Commonly known as:  BENADRYL  Take 25-50 mg by mouth 2 (two) times daily as needed for itching or allergies.     ferrous sulfate 325 (65 FE) MG tablet  Take 325 mg by mouth daily with breakfast.     hydrocortisone 2.5 % cream  Apply topically 2 (two) times daily.     lidocaine 2 % solution  Commonly known as:  XYLOCAINE  Use as directed  15 mLs in the mouth or throat every 6 (six) hours as needed for mouth pain.     lisinopril 40 MG tablet  Commonly known as:  PRINIVIL,ZESTRIL  Take 1 tablet (40 mg total) by mouth daily.     methylPREDNISolone 4 MG Tbpk tablet  Commonly known as:  MEDROL DOSEPAK  follow package directions     metoprolol 50 MG tablet  Commonly known as:  LOPRESSOR  Take 1 tablet (50 mg total) by mouth 2 (two) times daily.     MULTIVITAMIN PO  Take 1 tablet by mouth daily.     Potassium Chloride ER 20 MEQ Tbcr  Take 20 mEq by mouth 2 (two) times daily.     predniSONE 10 MG tablet  Commonly known as:  DELTASONE  Take 2 tablets (20 mg total) by mouth daily with breakfast.        Meds ordered this encounter  Medications  . methylPREDNISolone (MEDROL DOSEPAK) 4 MG TBPK tablet    Sig: follow package directions    Dispense:  21 tablet    Refill:  0  . metoprolol (LOPRESSOR) 50 MG tablet    Sig: Take 1 tablet (50 mg total) by mouth 2 (  two) times daily.    Dispense:  60 tablet    Refill:  3  . chlorthalidone (HYGROTON) 50 MG tablet    Sig: Take 1 tablet (50 mg total) by mouth daily.    Dispense:  30 tablet    Refill:  3  . lisinopril (PRINIVIL,ZESTRIL) 40 MG tablet    Sig: Take 1 tablet (40 mg total) by mouth daily.    Dispense:  30 tablet    Refill:  3  . citalopram (CELEXA) 40 MG tablet    Sig: Take 1 tablet (40 mg total) by mouth daily.    Dispense:  30 tablet    Refill:  3  . Potassium Chloride ER 20 MEQ TBCR    Sig: Take 20 mEq by mouth 2 (two) times daily.    Dispense:  60 tablet    Refill:  3  . hydrocortisone 2.5 % cream    Sig: Apply topically 2 (two) times daily.    Dispense:  30 g    Refill:  0     There is no immunization history on file for this patient.  Family History  Problem Relation Age of Onset  . CAD Mother   . Diabetes Mellitus II Mother   . Stroke Mother   . Allergy (severe) Sister   . Allergy (severe) Son     History  Substance Use Topics  .  Smoking status: Never Smoker   . Smokeless tobacco: Not on file  . Alcohol Use: Yes     Comment: occoasional.    Review of Systems   As noted in HPI  Filed Vitals:   02/17/15 1037  BP: 118/81  Pulse: 76  Temp: 98 F (36.7 C)  Resp: 16    Physical Exam  Physical Exam  Eyes: EOM are normal. Pupils are equal, round, and reactive to light.  Cardiovascular: Normal rate and regular rhythm.   Pulmonary/Chest: Breath sounds normal. No stridor. No respiratory distress. She has no wheezes. She has no rales.  Musculoskeletal: She exhibits no edema.  Skin:  Rash on right arm and back    Labs   Lab Results  Component Value Date   WBC 10.7* 06/28/2013   HGB 10.8* 06/28/2013   HCT 32.9* 06/28/2013   PLT 337 06/28/2013   GLUCOSE 104* 08/08/2014   ALT 9 08/08/2014   AST 12 08/08/2014   NA 141 08/08/2014   K 3.9 08/08/2014   CL 104 08/08/2014   CREATININE 1.06 08/08/2014   BUN 23 08/08/2014   CO2 25 08/08/2014   TSH 1.454 06/27/2013    No results found for: HGBA1C   Assessment and Plan  Rash and nonspecific skin eruption - Plan: methylPREDNISolone (MEDROL DOSEPAK) 4 MG TBPK tablet, hydrocortisone 2.5 % cream  Essential hypertension - Plan: metoprolol (LOPRESSOR) 50 MG tablet, chlorthalidone (HYGROTON) 50 MG tablet, lisinopril (PRINIVIL,ZESTRIL) 40 MG tablet, COMPLETE METABOLIC PANEL WITH GFR  Anxiety state - Plan: citalopram (CELEXA) 40 MG tablet  Hypokalemia - Plan: Potassium Chloride ER 20 MEQ TBCR, will repeat blood chemistry.   Return in about 3 months (around 05/20/2015), or if symptoms worsen or fail to improve.   This note has been created with Surveyor, quantity. Any transcriptional errors are unintentional.    Lorayne Marek, MD

## 2015-02-20 ENCOUNTER — Other Ambulatory Visit: Payer: Self-pay | Admitting: Family Medicine

## 2015-02-20 DIAGNOSIS — E876 Hypokalemia: Secondary | ICD-10-CM

## 2015-02-23 ENCOUNTER — Telehealth: Payer: Self-pay

## 2015-02-23 NOTE — Telephone Encounter (Signed)
Patient not available Left message on voice mail to return our call 

## 2015-04-04 ENCOUNTER — Encounter: Payer: Self-pay | Admitting: Obstetrics & Gynecology

## 2015-04-10 ENCOUNTER — Telehealth: Payer: Self-pay | Admitting: Internal Medicine

## 2015-04-10 NOTE — Telephone Encounter (Signed)
Patient called stating that she believes she is having an allergic reaction to citalopram (CELEXA) 40 MG tablet. Both arms are breaking out on hives. Please follow up with pt. Thank you.

## 2015-04-11 ENCOUNTER — Telehealth: Payer: Self-pay

## 2015-04-11 NOTE — Telephone Encounter (Signed)
Returned patient phone call Patient not  Available Left message on voice mail to return our call

## 2015-07-25 MED FILL — ?CHLORTHALIDONE 25 MG TABLE: 25 | 3 days supply | Qty: 60 | Fill #3

## 2015-07-25 MED FILL — LISINOPRIL 40 MG TABLET: 40 | 30 days supply | Qty: 30 | Fill #3

## 2015-07-25 MED FILL — ?METOPROLOL 50 MG TABLET: 50 | 30 days supply | Qty: 60 | Fill #1

## 2015-07-25 MED FILL — POTASSIUM CL ER 20 MEQ TAB: 20 | 30 days supply | Qty: 60 | Fill #1

## 2015-09-08 ENCOUNTER — Telehealth: Payer: Self-pay

## 2015-09-08 ENCOUNTER — Telehealth: Payer: Self-pay | Admitting: Internal Medicine

## 2015-09-08 DIAGNOSIS — I10 Essential (primary) hypertension: Secondary | ICD-10-CM

## 2015-09-08 MED ORDER — LISINOPRIL 40 MG PO TABS: 40.0000 mg | ORAL_TABLET | Freq: Every day | ORAL | Status: DC

## 2015-09-08 MED ORDER — METOPROLOL TARTRATE 50 MG PO TABS: 50.0000 mg | ORAL_TABLET | Freq: Two times a day (BID) | ORAL | Status: DC

## 2015-09-08 MED ORDER — CHLORTHALIDONE 50 MG PO TABS: 50.0000 mg | ORAL_TABLET | Freq: Every day | ORAL | Status: DC

## 2015-09-08 MED FILL — METOPROLOL TARTRATE 50 MG T: 50 | 30 days supply | Qty: 60 | Fill #0

## 2015-09-08 MED FILL — ?CHLORTHALIDONE 25 MG TABLE: 25 | 30 days supply | Qty: 60 | Fill #0

## 2015-09-08 MED FILL — LISINOPRIL 40 MG TABLET: 40 | 30 days supply | Qty: 30 | Fill #0

## 2015-09-08 NOTE — Telephone Encounter (Signed)
Returned patient phone call Patient requesting refills on her medications Until her appointment next month A thirty day supply was sent to the pharmacy

## 2015-09-08 NOTE — Telephone Encounter (Signed)
Pt. Needs medication refills.Marland KitchenMarland KitchenMarland KitchenMarland Kitchenpatient has appt to re-establish care with another pcp

## 2015-09-18 ENCOUNTER — Encounter: Payer: Self-pay | Admitting: Clinical

## 2015-09-18 ENCOUNTER — Other Ambulatory Visit: Payer: Self-pay

## 2015-09-18 ENCOUNTER — Ambulatory Visit: Payer: BLUE CROSS/BLUE SHIELD | Attending: Family Medicine | Admitting: Family Medicine

## 2015-09-18 ENCOUNTER — Encounter: Payer: Self-pay | Admitting: Family Medicine

## 2015-09-18 VITALS — BP 121/79 | HR 77 | Temp 97.8°F | Resp 16 | Ht 65.5 in | Wt 265.0 lb

## 2015-09-18 DIAGNOSIS — I10 Essential (primary) hypertension: Secondary | ICD-10-CM | POA: Diagnosis not present

## 2015-09-18 DIAGNOSIS — H6123 Impacted cerumen, bilateral: Secondary | ICD-10-CM | POA: Diagnosis not present

## 2015-09-18 DIAGNOSIS — E876 Hypokalemia: Secondary | ICD-10-CM | POA: Diagnosis not present

## 2015-09-18 DIAGNOSIS — Z0000 Encounter for general adult medical examination without abnormal findings: Secondary | ICD-10-CM | POA: Diagnosis not present

## 2015-09-18 DIAGNOSIS — F411 Generalized anxiety disorder: Secondary | ICD-10-CM | POA: Diagnosis not present

## 2015-09-18 DIAGNOSIS — Z23 Encounter for immunization: Secondary | ICD-10-CM | POA: Insufficient documentation

## 2015-09-18 DIAGNOSIS — E559 Vitamin D deficiency, unspecified: Secondary | ICD-10-CM

## 2015-09-18 DIAGNOSIS — Z79899 Other long term (current) drug therapy: Secondary | ICD-10-CM | POA: Insufficient documentation

## 2015-09-18 DIAGNOSIS — G4730 Sleep apnea, unspecified: Secondary | ICD-10-CM | POA: Insufficient documentation

## 2015-09-18 DIAGNOSIS — D649 Anemia, unspecified: Secondary | ICD-10-CM

## 2015-09-18 LAB — TSH: TSH: 2.05 m[IU]/L

## 2015-09-18 LAB — CBC
HCT: 35.9 % — ABNORMAL LOW (ref 36.0–46.0)
Hemoglobin: 11.4 g/dL — ABNORMAL LOW (ref 12.0–15.0)
MCH: 22.6 pg — ABNORMAL LOW (ref 26.0–34.0)
MCHC: 31.8 g/dL (ref 30.0–36.0)
MCV: 71.1 fL — ABNORMAL LOW (ref 78.0–100.0)
MPV: 10.9 fL (ref 8.6–12.4)
PLATELETS: 383 10*3/uL (ref 150–400)
RBC: 5.05 MIL/uL (ref 3.87–5.11)
RDW: 16.9 % — AB (ref 11.5–15.5)
WBC: 9.2 10*3/uL (ref 4.0–10.5)

## 2015-09-18 LAB — POCT GLYCOSYLATED HEMOGLOBIN (HGB A1C): Hemoglobin A1C: 5.7

## 2015-09-18 LAB — BASIC METABOLIC PANEL
BUN: 17 mg/dL (ref 7–25)
CHLORIDE: 102 mmol/L (ref 98–110)
CO2: 27 mmol/L (ref 20–31)
Calcium: 8.9 mg/dL (ref 8.6–10.2)
Creat: 0.71 mg/dL (ref 0.50–1.10)
Glucose, Bld: 91 mg/dL (ref 65–99)
POTASSIUM: 3.4 mmol/L — AB (ref 3.5–5.3)
Sodium: 141 mmol/L (ref 135–146)

## 2015-09-18 MED ORDER — ESCITALOPRAM OXALATE 10 MG PO TABS: 10.0000 mg | ORAL_TABLET | Freq: Every day | ORAL | Status: DC

## 2015-09-18 MED ORDER — CARBAMIDE PEROXIDE 6.5 % OT SOLN: 5.0000 [drp] | Freq: Two times a day (BID) | OTIC | Status: DC

## 2015-09-18 MED ORDER — HYDROXYZINE HCL 50 MG PO TABS: 25.0000 mg | ORAL_TABLET | Freq: Three times a day (TID) | ORAL | Status: DC | PRN

## 2015-09-18 MED FILL — ESCITALOPRAM 10 MG TABLET: 10 | 30 days supply | Qty: 30 | Fill #0

## 2015-09-18 MED FILL — EAR DROPS 6.5%: 6.5 | 7 days supply | Qty: 15 | Fill #0

## 2015-09-18 MED FILL — hydrOXYzine HCL 50 MG TABS: 50 | 10 days supply | Qty: 30 | Fill #0

## 2015-09-18 NOTE — Assessment & Plan Note (Signed)
A: Blood pressure at goal. Meds: compliant  P: I will continue the patient's current medication regimen since her blood pressure is at goal.   
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
## 2015-09-18 NOTE — Patient Instructions (Addendum)
Theresa Pearson was seen today for hypertension and anxiety.  Diagnoses and all orders for this visit:  Healthcare maintenance -     HgB A1c -     Flu Vaccine QUAD 36+ mos IM  Hypokalemia -     Basic Metabolic Panel  Essential hypertension -     Basic Metabolic Panel  Sleep apnea -     Split night study; Future  GAD (generalized anxiety disorder) -     TSH -     CBC -     Vitamin D, 25-hydroxy -     escitalopram (LEXAPRO) 10 MG tablet; Take 1 tablet (10 mg total) by mouth daily. -     hydrOXYzine (ATARAX/VISTARIL) 50 MG tablet; Take 0.5-1 tablets (25-50 mg total) by mouth 3 (three) times daily as needed.   F/u in 4 weeks for anxiety   Dr. Adrian Blackwater
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
## 2015-09-18 NOTE — Progress Notes (Signed)
F/U HTN  Medicine refills  Complaining with anxiety  No paint today  No suicidal thoughts in the past two weeks

## 2015-09-18 NOTE — Assessment & Plan Note (Signed)
-   Repeat BMP

## 2015-09-18 NOTE — Progress Notes (Signed)
Depression screen [facility] 2/9 09/18/2015 02/17/2015 09/21/2014 07/06/2013  Decreased Interest 0 1 1 0  Down, Depressed, Hopeless 0 3 1 -  PHQ - 2 Score 0 4 2 0  Altered sleeping 2 3 1  -  Tired, decreased energy 1 2 1  -  Change in appetite 1 2 1  -  Feeling bad or failure about yourself  0 2 1 -  Trouble concentrating 0 1 1 -  Moving slowly or fidgety/restless 0 1 1 -  Suicidal thoughts 0 1 1 -  PHQ-9 Score 4 16 9  -    GAD 7 : Generalized Anxiety Score 09/18/2015  Nervous, Anxious, on Edge 3  Control/stop worrying 2  Worry too much - different things 2  Trouble relaxing 2  Restless 0  Easily annoyed or irritable 1  Afraid - awful might happen 2  Total GAD 7 Score 12

## 2015-09-19 DIAGNOSIS — E559 Vitamin D deficiency, unspecified: Secondary | ICD-10-CM | POA: Insufficient documentation

## 2015-09-19 DIAGNOSIS — D649 Anemia, unspecified: Secondary | ICD-10-CM | POA: Insufficient documentation

## 2015-09-19 DIAGNOSIS — H6123 Impacted cerumen, bilateral: Secondary | ICD-10-CM | POA: Insufficient documentation

## 2015-09-19 LAB — VITAMIN D 25 HYDROXY (VIT D DEFICIENCY, FRACTURES): VIT D 25 HYDROXY: 17 ng/mL — AB (ref 30–100)

## 2015-09-19 MED ORDER — FERROUS SULFATE 325 (65 FE) MG PO TABS: 325.0000 mg | ORAL_TABLET | Freq: Every day | ORAL | Status: DC

## 2015-09-19 MED ORDER — POTASSIUM CHLORIDE ER 20 MEQ PO TBCR: 20.0000 meq | EXTENDED_RELEASE_TABLET | Freq: Two times a day (BID) | ORAL | Status: DC

## 2015-09-19 MED ORDER — VITAMIN D (ERGOCALCIFEROL) 1.25 MG (50000 UNIT) PO CAPS: 50000.0000 [IU] | ORAL_CAPSULE | ORAL | Status: DC

## 2015-09-19 MED FILL — FERROUS SULFATE 325 MG TAB: 325 (65 FE) | 30 days supply | Qty: 30 | Fill #0

## 2015-09-19 MED FILL — POTASSIUM CL ER 20 MEQ TAB: 20 | 30 days supply | Qty: 60 | Fill #0

## 2015-09-19 MED FILL — VIT D2 1.25 MG (50,000 UNIT: 1.25 MG | 8 days supply | Qty: 8 | Fill #0

## 2015-09-19 NOTE — Addendum Note (Signed)
Addended by: Boykin Nearing on: 09/19/2015 08:42 AM   Modules accepted: Orders, SmartSet

## 2015-09-20 ENCOUNTER — Telehealth: Payer: Self-pay | Admitting: *Deleted

## 2015-09-20 NOTE — Telephone Encounter (Signed)
LVM to return call.

## 2015-09-20 NOTE — Telephone Encounter (Signed)
-----   Message from Boykin Nearing, MD sent at 09/19/2015  8:39 AM EST ----- Vit D is low, will replace Improved anemia Still with slightly low potassium All other labs normal

## 2015-10-16 ENCOUNTER — Ambulatory Visit: Payer: BLUE CROSS/BLUE SHIELD | Attending: Family Medicine | Admitting: Family Medicine

## 2015-10-16 ENCOUNTER — Other Ambulatory Visit: Payer: Self-pay | Admitting: Internal Medicine

## 2015-10-16 ENCOUNTER — Encounter: Payer: Self-pay | Admitting: Family Medicine

## 2015-10-16 ENCOUNTER — Encounter: Payer: Self-pay | Admitting: Clinical

## 2015-10-16 VITALS — BP 105/68 | HR 69 | Temp 98.5°F | Resp 16 | Ht 65.5 in | Wt 269.0 lb

## 2015-10-16 DIAGNOSIS — M25561 Pain in right knee: Secondary | ICD-10-CM

## 2015-10-16 DIAGNOSIS — M25562 Pain in left knee: Secondary | ICD-10-CM

## 2015-10-16 DIAGNOSIS — G8929 Other chronic pain: Secondary | ICD-10-CM | POA: Diagnosis not present

## 2015-10-16 DIAGNOSIS — F411 Generalized anxiety disorder: Secondary | ICD-10-CM | POA: Diagnosis not present

## 2015-10-16 DIAGNOSIS — Z79899 Other long term (current) drug therapy: Secondary | ICD-10-CM | POA: Insufficient documentation

## 2015-10-16 MED ORDER — ESCITALOPRAM OXALATE 20 MG PO TABS: 20.0000 mg | ORAL_TABLET | Freq: Every day | ORAL | Status: DC

## 2015-10-16 MED FILL — CHLORTHALIDONE 25 MG TABLET: 25 | 30 days supply | Qty: 60 | Fill #0

## 2015-10-16 MED FILL — LISINOPRIL 40 MG TABLET: 40 | 30 days supply | Qty: 30 | Fill #0

## 2015-10-16 MED FILL — ESCITALOPRAM 20 MG TABLET: 20 | 30 days supply | Qty: 30 | Fill #0

## 2015-10-16 NOTE — Assessment & Plan Note (Signed)
Improved Increase lexapro to 20 mg daily  Continue atarax prn

## 2015-10-16 NOTE — Patient Instructions (Addendum)
Theresa Pearson was seen today for anxiety.  Diagnoses and all orders for this visit:  GAD (generalized anxiety disorder) -     escitalopram (LEXAPRO) 20 MG tablet; Take 1 tablet (20 mg total) by mouth daily.  Bilateral chronic knee pain -     Ambulatory referral to Orthopedic Surgery   Follow up next week Wednesday morning for "nurse visit" with Junious Dresser for ear irrigation. Use debrox 5 days prior to irrigation   Follow up in 8 weeks for mood  Dr. Adrian Blackwater

## 2015-10-16 NOTE — Progress Notes (Signed)
F/U anxiety  No tobacco user  No suicidal thoughts in the pas to weeks  No pain today

## 2015-10-16 NOTE — Progress Notes (Signed)
Depression screen [facility] 2/9 10/16/2015 09/18/2015 02/17/2015 09/21/2014 07/06/2013  Decreased Interest 1 0 1 1 0  Down, Depressed, Hopeless 0 0 3 1 -  PHQ - 2 Score 1 0 4 2 0  Altered sleeping 1 2 3 1  -  Tired, decreased energy 3 1 2 1  -  Change in appetite 1 1 2 1  -  Feeling bad or failure about yourself  0 0 2 1 -  Trouble concentrating 1 0 1 1 -  Moving slowly or fidgety/restless 0 0 1 1 -  Suicidal thoughts 0 0 1 1 -  PHQ-9 Score 7 4 16 9  -    GAD 7 : Generalized Anxiety Score 10/16/2015 09/18/2015  Nervous, Anxious, on Edge 1 3  Control/stop worrying 0 2  Worry too much - different things 0 2  Trouble relaxing 1 2  Restless 0 0  Easily annoyed or irritable 1 1  Afraid - awful might happen 1 2  Total GAD 7 Score 4 12

## 2015-10-16 NOTE — Progress Notes (Signed)
Subjective:  Patient ID: Theresa Pearson, female    DOB: [birth year]  Age: 50 y.o. MRN: [id number]  CC: Anxiety   HPI Briele Deahl presents for   1. Anxiety: improved. She is less nervous with driving. She still has fatigue. She is awaiting sleep study.  2. Knee pain: chronic for past 30 years. 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 She has had injections in her back in the past. She is trying to lose weight. 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 Both knees hurt. 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   3. Ear wax: she forgot to use debrox. No ear pain or dizziness. No hearing loss.   Social History  Substance Use Topics  . Smoking status: Never Smoker   . Smokeless tobacco: Not on file  . Alcohol Use: Yes     Comment: occoasional.    Outpatient Prescriptions Prior to Visit  Medication Sig Dispense Refill  . albuterol (PROVENTIL HFA;VENTOLIN HFA) 108 (90 BASE) MCG/ACT inhaler Inhale 2 puffs into the lungs every 6 (six) hours as needed for wheezing or shortness of breath. 3 Inhaler 3  . chlorthalidone (HYGROTON) 50 MG tablet Take 1 tablet (50 mg total) by mouth daily. 30 tablet 0  . escitalopram (LEXAPRO) 10 MG tablet Take 1 tablet (10 mg total) by mouth daily. 30 tablet 3  . ferrous sulfate 325 (65 FE) MG tablet Take 1 tablet (325 mg total) by mouth daily with breakfast. 30 tablet 11  . hydrOXYzine (ATARAX/VISTARIL) 50 MG tablet Take 0.5-1 tablets (25-50 mg total) by mouth 3 (three) times daily as needed. 30 tablet 0  . lisinopril (PRINIVIL,ZESTRIL) 40 MG tablet Take 1 tablet (40 mg total) by mouth daily. 30 tablet 0  . metoprolol (LOPRESSOR) 50 MG tablet Take 1 tablet (50 mg total) by mouth 2 (two) times daily. 60 tablet 0  . Multiple Vitamins-Minerals (MULTIVITAMIN PO) Take 1 tablet by mouth daily.    . Potassium Chloride ER 20 MEQ TBCR Take 20 mEq by mouth 2 (two) times daily. 60 tablet 3  . Vitamin D,  Ergocalciferol, (DRISDOL) 50000 units CAPS capsule Take 1 capsule (50,000 Units total) by mouth every 7 (seven) days. For 8 weeks 8 capsule 0  . carbamide peroxide (DEBROX) 6.5 % otic solution Place 5 drops into both ears 2 (two) times daily. For 5 days prior to irrigation (Patient not taking: Reported on 10/16/2015) 15 mL 0  . ferrous sulfate 325 (65 FE) MG tablet Take 325 mg by mouth daily with breakfast.    . Potassium Chloride ER 20 MEQ TBCR Take 20 mEq by mouth 2 (two) times daily. 60 tablet 3   No facility-administered medications prior to visit.    ROS Review of Systems  Constitutional: Negative for fever and chills.  Eyes: Negative for visual disturbance.  Respiratory: Negative for shortness of breath.   Cardiovascular: Negative for chest pain.  Gastrointestinal: Negative for abdominal pain and blood in stool.  Musculoskeletal: Negative for back pain and arthralgias.  Skin: Negative for rash.  Allergic/Immunologic: Negative for immunocompromised state.  Hematological: Negative for adenopathy. Does not bruise/bleed easily.  Psychiatric/Behavioral: Negative for suicidal ideas and dysphoric mood.    Objective:  BP 105/68 mmHg  Pulse 69  Temp(Src) 98.5 F (36.9 C) (Oral)  Resp 16  Ht 5' 5.5" (1.664 m)  Wt 269 lb (122.018 kg)  BMI 44.07 kg/m2  SpO2 96%  BP/Weight  10/16/2015 123XX123 [id number]  Systolic BP 123456 123XX123 [id number]  Diastolic BP 68 79 81  Wt. (Lbs) 269 265 253.2  BMI 44.07 43.41 40.89   Physical Exam  Constitutional: She is oriented to person, place, and time. She appears well-developed and well-nourished. No distress.  HENT:  Head: Normocephalic and atraumatic.  Cerumen in both ears   Cardiovascular: Normal rate, regular rhythm, normal heart sounds and intact distal pulses.   Pulmonary/Chest: Effort normal and breath sounds normal.  Musculoskeletal: She exhibits no edema.  Neurological: She is alert and oriented to person, place, and time.  Skin: Skin is warm and dry.  No rash noted.  Psychiatric: She has a normal mood and affect.   Depression screen [facility] 2/9 09/18/2015 02/17/2015 09/21/2014 07/06/2013  Decreased Interest 0 1 1 0  Down, Depressed, Hopeless 0 3 1 -  PHQ - 2 Score 0 4 2 0  Altered sleeping 2 3 1  -  Tired, decreased energy 1 2 1  -  Change in appetite 1 2 1  -  Feeling bad or failure about yourself  0 2 1 -  Trouble concentrating 0 1 1 -  Moving slowly or fidgety/restless 0 1 1 -  Suicidal thoughts 0 1 1 -  PHQ-9 Score 4 16 9  -    GAD 7 : Generalized Anxiety Score 09/18/2015  Nervous, Anxious, on Edge 3  Control/stop worrying 2  Worry too much - different things 2  Trouble relaxing 2  Restless 0  Easily annoyed or irritable 1  Afraid - awful might happen 2  Total GAD 7 Score 12      Assessment & Plan:   There are no diagnoses linked to this encounter. Marvelene was seen today for anxiety.  Diagnoses and all orders for this visit:  GAD (generalized anxiety disorder) -     escitalopram (LEXAPRO) 20 MG tablet; Take 1 tablet (20 mg total) by mouth daily.  Bilateral chronic knee pain -     Ambulatory referral to Orthopedic Surgery   Meds ordered this encounter  Medications  . escitalopram (LEXAPRO) 20 MG tablet    Sig: Take 1 tablet (20 mg total) by mouth daily.    Dispense:  30 tablet    Refill:  3    Follow-up: No Follow-up on file.   Boykin Nearing MD

## 2015-10-16 NOTE — Assessment & Plan Note (Signed)
A: chronic knee pain P: Ortho referral

## 2015-10-30 ENCOUNTER — Ambulatory Visit (INDEPENDENT_AMBULATORY_CARE_PROVIDER_SITE_OTHER): Payer: BLUE CROSS/BLUE SHIELD | Admitting: Sports Medicine

## 2015-10-30 ENCOUNTER — Encounter: Payer: Self-pay | Admitting: Sports Medicine

## 2015-10-30 ENCOUNTER — Ambulatory Visit
Admission: RE | Admit: 2015-10-30 | Discharge: 2015-10-30 | Disposition: A | Discharge status: Home / Self Care | Payer: BLUE CROSS/BLUE SHIELD | Source: Ambulatory Visit | Attending: Sports Medicine | Admitting: Sports Medicine

## 2015-10-30 ENCOUNTER — Other Ambulatory Visit: Payer: Self-pay | Admitting: Sports Medicine

## 2015-10-30 VITALS — BP 129/86 | Ht 65.5 in | Wt 266.0 lb

## 2015-10-30 DIAGNOSIS — M25562 Pain in left knee: Principal | ICD-10-CM

## 2015-10-30 DIAGNOSIS — M25561 Pain in right knee: Secondary | ICD-10-CM

## 2015-10-30 DIAGNOSIS — G8929 Other chronic pain: Secondary | ICD-10-CM

## 2015-10-30 NOTE — Progress Notes (Signed)
   Subjective:    Patient ID: Theresa Pearson, female    DOB: 06/23/1966, 50 y.o.   MRN: [id number]  HPI chief complaint: Bilateral knee pain  Very pleasant 50 year old female comes in today complaining of long-standing bilateral knee pain, right greater than left. She has had bilateral knee pain since the age of 42. She has been told in the past that she has arthritis in both knees. Her treatment has been limited due to a lack of insurance, but she is now working as a Administrator and is ready to have her knees further evaluated. Over the years, she has taken intermittent doses of over-the-counter ibuprofen which does help some but not significantly. She does not have any significant pain while driving but does notice pain primarily with walking up and down stairs or with squatting. Her pain is localized to the anterior medial knee. She has not noticed any swelling. No radiating pain. No associated numbness or tingling. No prior knee surgeries. No trauma to either knee. No pain more proximally in the groin.  Past medical history reviewed Medications reviewed Allergies reviewed    Review of Systems As above    Objective:   Physical Exam  Obese. No acute distress.  Examination of each knee shows full range of motion. No effusion. 2+ patellofemoral crepitus on the right, 1+ patellofemoral crepitus on the left. She is tender to palpation along the medial joint lines bilaterally with pain but no popping with McMurray's. Mild tenderness to palpation along the lateral joint line as well. Good joint stability. Fairly good alignment overall. Neurovascularly intact distally. Mild pronation with walking.      Assessment & Plan:  Obesity Long-standing bilateral knee pain likely secondary to patellofemoral DJD versus medial compartmental DJD Pronation  Patient will get standing x-rays today. She will return to the office tomorrow to discuss those results and discuss treatment which could include  oral anti-inflammatories or cortisone injections. She also understands the importance of weight loss in the overall health of her knees (she tells me that she has gained 30 pounds over the past year). I will give her some green sports insoles to help provide cushioning as well as some slight arch support.

## 2015-10-31 ENCOUNTER — Ambulatory Visit (HOSPITAL_BASED_OUTPATIENT_CLINIC_OR_DEPARTMENT_OTHER): Payer: BLUE CROSS/BLUE SHIELD | Attending: Family Medicine | Admitting: Internal Medicine

## 2015-10-31 ENCOUNTER — Encounter: Payer: Self-pay | Admitting: Sports Medicine

## 2015-10-31 ENCOUNTER — Ambulatory Visit (INDEPENDENT_AMBULATORY_CARE_PROVIDER_SITE_OTHER): Payer: BLUE CROSS/BLUE SHIELD | Admitting: Sports Medicine

## 2015-10-31 VITALS — Ht 66.0 in | Wt 266.0 lb

## 2015-10-31 VITALS — BP 128/73

## 2015-10-31 DIAGNOSIS — G4719 Other hypersomnia: Secondary | ICD-10-CM | POA: Diagnosis not present

## 2015-10-31 DIAGNOSIS — E669 Obesity, unspecified: Secondary | ICD-10-CM | POA: Diagnosis not present

## 2015-10-31 DIAGNOSIS — Z79899 Other long term (current) drug therapy: Secondary | ICD-10-CM | POA: Insufficient documentation

## 2015-10-31 DIAGNOSIS — R51 Headache: Secondary | ICD-10-CM | POA: Diagnosis not present

## 2015-10-31 DIAGNOSIS — R0683 Snoring: Secondary | ICD-10-CM | POA: Insufficient documentation

## 2015-10-31 DIAGNOSIS — R5383 Other fatigue: Secondary | ICD-10-CM | POA: Diagnosis not present

## 2015-10-31 DIAGNOSIS — I10 Essential (primary) hypertension: Secondary | ICD-10-CM | POA: Insufficient documentation

## 2015-10-31 DIAGNOSIS — G4730 Sleep apnea, unspecified: Secondary | ICD-10-CM

## 2015-10-31 DIAGNOSIS — M170 Bilateral primary osteoarthritis of knee: Secondary | ICD-10-CM

## 2015-10-31 DIAGNOSIS — G4733 Obstructive sleep apnea (adult) (pediatric): Secondary | ICD-10-CM | POA: Diagnosis not present

## 2015-10-31 DIAGNOSIS — Z6841 Body Mass Index (BMI) 40.0 and over, adult: Secondary | ICD-10-CM | POA: Diagnosis not present

## 2015-10-31 MED ORDER — TRAMADOL HCL 50 MG PO TABS: ORAL_TABLET | ORAL | Status: DC

## 2015-10-31 NOTE — Progress Notes (Signed)
Patient ID: Theresa Pearson, female   DOB: March 14, 1966, 50 y.o.   MRN: [id number]  Patient comes in today to discuss x-rays of her knees. She has bilateral patellofemoral DJD. Medial and lateral joint spaces are well-preserved. Nothing acute is seen. We discussed treatment options including oral medications and cortisone injections. We also discussed the importance of weight loss once again. She has been on tramadol in the past with good results. 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 Along those same lines, I explained to her that she cannot take this medication and drive. She understands this. Regarding NSAIDs, she has had some stomach upset with them in the past but there was a particular anti-inflammatory that was prescribed by a rheumatologist many years ago which was helpful and did not cause her any problems. She is unable to recall the name of that medicine. If she is able to remember at a later date, she will call me and I will consider prescribing that medication as well. Patient will follow-up with me in 4 weeks for reevaluation. If symptoms worsen, we discussed the possibility of cortisone injections.  Total time spent with the patient was 10 minutes with greater than 50% of the time spent in face-to-face consultation discussing her diagnosis, treatment options, and reviewing her x-rays.

## 2015-11-05 DIAGNOSIS — G4730 Sleep apnea, unspecified: Secondary | ICD-10-CM | POA: Diagnosis not present

## 2015-11-05 NOTE — Procedures (Signed)
       Patient Name: Theresa Pearson, Theresa Pearson Date: 10/31/2015 Gender: Female D.O.B: October 12, 1965 Age (years): 49 Referring Provider: Adriana Mccallum Funches Height (inches): 71 Interpreting Physician: Baird Lyons MD, ABSM Weight (lbs): 266 RPSGT: Carolin Coy BMI: 74 MRN: [id number] Neck Size: 13.00 CLINICAL INFORMATION Sleep Study Type: NPSG Indication for sleep study: Excessive Daytime Sleepiness, Fatigue, Hypertension, Morning Headaches, Obesity, Snoring, Witnessed Apneas Epworth Sleepiness Score: 15  SLEEP STUDY TECHNIQUE As per the AASM Manual for the Scoring of Sleep and Associated Events v2.3 (April 2016) with a hypopnea requiring 4% desaturations. The channels recorded and monitored were frontal, central and occipital EEG, electrooculogram (EOG), submentalis EMG (chin), nasal and oral airflow, thoracic and abdominal wall motion, anterior tibialis EMG, snore microphone, electrocardiogram, and pulse oximetry.  MEDICATIONS Patient's medications include: charted for review Medications self-administered by patient during sleep study : CHLORTHALIDONE, LEXAPRO, HYDROXYZINE, LISINOPRIL, METOPROLOL, POTASSIUM CHLORIDE   SLEEP ARCHITECTURE The study was initiated at 10:35:08 PM and ended at 4:50:17 AM. Sleep onset time was 15.8 minutes and the sleep efficiency was 83.4%. The total sleep time was 313.0 minutes. Stage REM latency was 348.5 minutes. The patient spent 9.74% of the night in stage N1 sleep, 86.26% in stage N2 sleep, 0.64% in stage N3 and 3.35% in REM. Alpha intrusion was absent. Supine sleep was 40.89%. Wake after sl;eep onset 46 minutes  RESPIRATORY PARAMETERS The overall apnea/hypopnea index (AHI) was 2.5 per hour. There were 0 total apneas, including 0 obstructive, 0 central and 0 mixed apneas. There were 13 hypopneas and 16 RERAs. The AHI during Stage REM sleep was 62.9 per hour. AHI while supine was 0.0 per hour. The mean oxygen saturation was 93.84%. The minimum SpO2  during sleep was 82.00%. Moderate snoring was noted during this study.  CARDIAC DATA The 2 lead EKG demonstrated sinus rhythm. The mean heart rate was 70.84 beats per minute. Other EKG findings include: None.  LEG MOVEMENT DATA The total PLMS were 84 with a resulting PLMS index of 16.10. Associated arousal with leg movement index was 1.2 .  IMPRESSIONS - No significant obstructive sleep apnea occurred during this study (AHI = 2.5/h). - No significant central sleep apnea occurred during this study (CAI = 0.0/h). - Mild oxygen desaturation was noted during this study (Min O2 = 82.00%). - The patient snored with Moderate snoring volume. - No cardiac abnormalities were noted during this study. - Mild periodic limb movements of sleep occurred during the study. No significant associated arousals.  DIAGNOSIS - Normal study  RECOMMENDATIONS - Avoid alcohol, sedatives and other CNS depressants that may worsen sleep apnea and disrupt normal sleep architecture. - Sleep hygiene should be reviewed to assess factors that may improve sleep quality. - Weight management and regular exercise should be initiated or continued if appropriate.  Deneise Lever Diplomate, American Board of Sleep Medicine  ELECTRONICALLY SIGNED ON:  11/05/2015, 3:10 PM [place] PH: [phone]   FX: [phone] [place]

## 2015-11-20 MED FILL — CHLORTHALIDONE 25 MG TABLET: 25 | 30 days supply | Qty: 60 | Fill #1

## 2015-11-20 MED FILL — LISINOPRIL 40 MG TABLET: 40 | 30 days supply | Qty: 30 | Fill #1

## 2015-11-20 MED FILL — FERROUS SULFATE 325 MG TAB: 325 (65 FE) | 30 days supply | Qty: 30 | Fill #1

## 2015-11-20 MED FILL — ESCITALOPRAM 20 MG TABLET: 20 | 30 days supply | Qty: 30 | Fill #1

## 2015-11-20 MED FILL — METOPROLOL TARTRATE 50 MG T: 50 | 30 days supply | Qty: 60 | Fill #2

## 2015-11-21 ENCOUNTER — Ambulatory Visit: Admitting: Obstetrics & Gynecology

## 2015-11-21 ENCOUNTER — Encounter: Payer: Self-pay | Admitting: Obstetrics & Gynecology

## 2015-11-21 VITALS — BP 110/68 | Ht 64.0 in | Wt 122.0 lb

## 2015-11-21 DIAGNOSIS — Z01419 Encounter for gynecological examination (general) (routine) without abnormal findings: Secondary | ICD-10-CM

## 2015-11-21 DIAGNOSIS — Z1231 Encounter for screening mammogram for malignant neoplasm of breast: Secondary | ICD-10-CM

## 2015-11-21 DIAGNOSIS — Z1151 Encounter for screening for human papillomavirus (HPV): Secondary | ICD-10-CM

## 2015-11-21 LAB — POCT URINALYSIS DIPSTICK (5)
Glucose, UA POCT: NEGATIVE mg/dL
Protein, UA POCT: NEGATIVE mg/dL

## 2015-11-21 NOTE — Progress Notes (Signed)
Subjective:       Elizabeth Dougherty is a 50 y.o. female here for a routine exam.  Current complaints: none.  Personal health questionnaire reviewed: yes.  Pt reports no menses for the past 6 months.  Having occasional hot flashes.  No d/c, dysuria, or dyspareunia.  Nl BM's.  No incontinence.  All 3 kids are doing well (daughter is in 7th grade, and other 2 are in 5th and 1st grade).  Pt has a h/o a stable left paraovarian cyst.     Gynecologic History  No LMP recorded (within months).  Contraception: vasectomy  Breast self exam: discussed  Common GYN tests  Last Pap Date: 11/15/14  Last Pap Result: (!) Abnormal  Pap Results: (!) ASCUS with NEGATIVE High Risk HPV  Last Mammo Date: 04/04/15  Last Mammo Result: Normal      The following portions of the patient's history were reviewed and updated as appropriate: allergies, current medications, past family history, past medical history, past social history, past surgical history and problem list.      Review of Systems  Pertinent items are noted in HPI.      Objective:      BP 110/68 mmHg  Ht 1.626 m (5\' 4" )  Wt 55.339 kg (122 lb)  BMI 20.93 kg/m2  LMP  (Within Months)  General appearance: alert, appears stated age and cooperative  Neck: no adenopathy, supple, symmetrical, trachea midline and thyroid not enlarged, symmetric, no tenderness/mass/nodules  Breasts: normal appearance, no masses or tenderness, No nipple retraction or dimpling, No nipple discharge or bleeding, No axillary or supraclavicular adenopathy  Abdomen: normal findings: no masses palpable, no organomegaly and soft, non-tender  Pelvic exam:     Urinary system: urethral meatus normal   External genitalia: normal general appearance   Vaginal: normal rugae   Cervix: normal appearance   Adnexa: normal bimanual exam   Uterus: normal single, nontender   Rectal: good sphincter tone and no masses          Assessment:      Healthy female exam.        Plan:      Mammogram ordered.  Follow up in: 1 year.  Thin  prep pap/HPV Yes secondary to ASCUS Pap with negative HR HPV

## 2015-11-23 LAB — IMAGE GUIDED PAP WITH APTIMA HPV TP
.: 0
HPV APTIMA: NEGATIVE

## 2015-11-27 ENCOUNTER — Ambulatory Visit: Payer: BLUE CROSS/BLUE SHIELD | Admitting: Sports Medicine

## 2015-12-05 ENCOUNTER — Encounter: Payer: Self-pay | Admitting: Physician Assistant

## 2015-12-05 ENCOUNTER — Ambulatory Visit (HOSPITAL_BASED_OUTPATIENT_CLINIC_OR_DEPARTMENT_OTHER): Payer: BLUE CROSS/BLUE SHIELD | Admitting: Physician Assistant

## 2015-12-05 ENCOUNTER — Ambulatory Visit (HOSPITAL_COMMUNITY)
Admission: RE | Admit: 2015-12-05 | Discharge: 2015-12-05 | Disposition: A | Discharge status: Home / Self Care | Payer: BLUE CROSS/BLUE SHIELD | Source: Ambulatory Visit | Attending: Physician Assistant | Admitting: Physician Assistant

## 2015-12-05 ENCOUNTER — Telehealth: Payer: Self-pay | Admitting: Physician Assistant

## 2015-12-05 VITALS — BP 128/81 | HR 74 | Temp 97.9°F | Resp 12 | Ht 65.5 in | Wt 271.0 lb

## 2015-12-05 DIAGNOSIS — I10 Essential (primary) hypertension: Secondary | ICD-10-CM | POA: Insufficient documentation

## 2015-12-05 DIAGNOSIS — E559 Vitamin D deficiency, unspecified: Secondary | ICD-10-CM | POA: Diagnosis not present

## 2015-12-05 DIAGNOSIS — M7989 Other specified soft tissue disorders: Secondary | ICD-10-CM | POA: Diagnosis not present

## 2015-12-05 DIAGNOSIS — H109 Unspecified conjunctivitis: Secondary | ICD-10-CM | POA: Diagnosis not present

## 2015-12-05 DIAGNOSIS — M7122 Synovial cyst of popliteal space [Baker], left knee: Secondary | ICD-10-CM | POA: Insufficient documentation

## 2015-12-05 DIAGNOSIS — M79605 Pain in left leg: Secondary | ICD-10-CM | POA: Diagnosis not present

## 2015-12-05 DIAGNOSIS — Z79899 Other long term (current) drug therapy: Secondary | ICD-10-CM | POA: Insufficient documentation

## 2015-12-05 DIAGNOSIS — G4730 Sleep apnea, unspecified: Secondary | ICD-10-CM | POA: Insufficient documentation

## 2015-12-05 MED ORDER — VITAMIN D (ERGOCALCIFEROL) 1.25 MG (50000 UNIT) PO CAPS: 50000.0000 [IU] | ORAL_CAPSULE | ORAL | Status: DC

## 2015-12-05 MED ORDER — OFLOXACIN 0.3 % OP SOLN: 1.0000 [drp] | Freq: Four times a day (QID) | OPHTHALMIC | Status: DC

## 2015-12-05 MED FILL — VIT D2 1.25 MG (50,000 UNIT: 1.25 MG | 4 days supply | Qty: 4 | Fill #0

## 2015-12-05 NOTE — Telephone Encounter (Signed)
Called pt. Pt verified name and date of birth. Notified pt that she does not have a blood clot in her leg, but has what is called a Baker's cyst. Notified her that it is benign and most likely due to arthritis. Recommended per Ena Dawley, [facility], to see Dr.Draper, her orthopedist, if it continues to cause her pain. Pt voiced understanding.

## 2015-12-05 NOTE — Progress Notes (Signed)
Pt here for redness in R eye and L leg pain. Pt noticed redness yesterday morning. Pt reports pain in leg rated at a 3, described as aching, sore, throbbing. Pain comes and go and has been present since Mother's Day. Pt reports taking ibuprofen and cream to help with the pain. Pt request meloxicam rather than a narcotic for the pain and also a refill on Vitamin D.

## 2015-12-05 NOTE — Addendum Note (Signed)
Addended byBrayton Caves on: 12/05/2015 10:46 AM   Modules accepted: Orders

## 2015-12-05 NOTE — Telephone Encounter (Signed)
-----   Message from Brayton Caves, [place] sent at 12/05/2015  2:20 PM EDT ----- Please call her and let her know that she does not have a blood clot in her leg. She does have what's called a Baker's cyst. It is benign and likely related to arthritis. If it continues to cause pain, she should see Dr. Micheline Chapman her orthopedist. Thanks!

## 2015-12-05 NOTE — Progress Notes (Signed)
VASCULAR LAB PRELIMINARY  PRELIMINARY  PRELIMINARY  PRELIMINARY  Left lower extremity venous duplex completed.    Preliminary report:  Left:  No evidence of DVT, superficial thrombosis, or Baker's cyst.  Patient has Baker's cyst in left popliteal fossa.  Called the Dr. Ena Dawley @2 :10 pm.  Janifer Adie, RVT, RDMS 12/05/2015, 2:14 PM

## 2015-12-05 NOTE — Progress Notes (Signed)
Chief Complaint: Left leg pain and swelling and right eye mucous  Subjective: 50 yo female with hx HTN and DJD presents acutely with 2 complaints.  1. Right eye red and stuck shut the last 2 mornings with green mucous. No fever or chills. Left eye ok. Itchy.  2. Since Mother's Day her left leg has been sore, painful and swelling. Better the las 24 hours because she has been off of it. Located to back of left knee and radiates down to the left ankle. No decrease in ROM. Occasionally takes anti inflammatories.    ROS:  GEN: denies fever or chills, denies change in weight Skin: denies lesions or rashes HEENT: denies headache, earache, epistaxis, sore throat, or neck pain; eye red and itchy EXT: +muscle spasms or swelling; no pain in lower ext, no weakness NEURO: denies numbness or tingling, denies sz, stroke or TIA   Objective:  Filed Vitals:   12/05/15 1024  BP: 128/81  Pulse: 74  Temp: 97.9 F (36.6 C)  TempSrc: Oral  Resp: 12  Height: 5' 5.5" (1.664 m)  Weight: 271 lb (122.925 kg)  SpO2: 94%    Physical Exam:  General: in no acute distress. HEENT: no pallor, no icterus, moist oral mucosa, no JVD, no lymphadenopathy; eye slightly injected no mucous today. Extremities: No clubbing cyanosis or edema with positive pedal pulses. No decrease ROM from left leg. Swelling improved. Neuro: Alert, awake, oriented x3, nonfocal.    Medications: Prior to Admission medications   Medication Sig Start Date End Date Taking? Authorizing Provider  albuterol (PROVENTIL HFA;VENTOLIN HFA) 108 (90 BASE) MCG/ACT inhaler Inhale 2 puffs into the lungs every 6 (six) hours as needed for wheezing or shortness of breath. 09/26/14  Yes Theresa Marek, MD  chlorthalidone (HYGROTON) 25 MG tablet TAKE 2 TABLETS BY MOUTH DAILY. 10/16/15  Yes Theresa Funches, MD  EAR DROPS EARWAX AID 6.5 % otic solution  09/18/15  Yes Historical Provider, MD  escitalopram (LEXAPRO) 20 MG tablet Take 1 tablet (20 mg total) by  mouth daily. 10/16/15  Yes Theresa Funches, MD  ferrous sulfate 325 (65 FE) MG tablet Take 1 tablet (325 mg total) by mouth daily with breakfast. 09/19/15  Yes Theresa Funches, MD  hydrOXYzine (ATARAX/VISTARIL) 50 MG tablet Take 0.5-1 tablets (25-50 mg total) by mouth 3 (three) times daily as needed. 09/18/15  Yes Theresa Funches, MD  lisinopril (PRINIVIL,ZESTRIL) 40 MG tablet TAKE 1 TABLET BY MOUTH DAILY. 10/16/15  Yes Theresa Funches, MD  metoprolol (LOPRESSOR) 50 MG tablet Take 1 tablet (50 mg total) by mouth 2 (two) times daily. 09/08/15  Yes Theresa Garter, MD  Multiple Vitamins-Minerals (MULTIVITAMIN PO) Take 1 tablet by mouth daily.   Yes Historical Provider, MD  Potassium Chloride ER 20 MEQ TBCR Take 20 mEq by mouth 2 (two) times daily. 09/19/15  Yes Theresa Funches, MD  Vitamin D, Ergocalciferol, (DRISDOL) 50000 units CAPS capsule Take 1 capsule (50,000 Units total) by mouth every 7 (seven) days. For 8 weeks 09/19/15  Yes Theresa Nearing, MD  escitalopram (LEXAPRO) 10 MG tablet Reported on 12/05/2015 09/18/15   Historical Provider, MD  ofloxacin (OCUFLOX) 0.3 % ophthalmic solution Place 1 drop into the right eye 4 (four) times daily. 12/05/15   Theresa Kivi Daneil Dan, PA-C  traMADol (ULTRAM) 50 MG tablet Take 1-2 tablets twice a day as needed for pain. Patient not taking: Reported on 12/05/2015 10/31/15   Theresa Coyer, DO    Assessment: 1. Right eye conjunctivitis 2. Left leg pain  and swelling r/o DVT  Plan: Ocuflox eye drops [place] left lower extremitiy r/o DVT Prn pain meds  Follow up:as scheduled with Dr. Adrian Pearson  The patient was given clear instructions to go to ER or return to medical center if symptoms don't improve, worsen or new problems develop. The patient verbalized understanding. The patient was told to call to get lab results if they haven't heard anything in the next week.   This note has been created with Surveyor, quantity. Any transcriptional  errors are unintentional.   Theresa Pho, PA-C 12/05/2015, 10:37 AM

## 2015-12-12 ENCOUNTER — Telehealth: Payer: Self-pay | Admitting: *Deleted

## 2015-12-12 NOTE — Telephone Encounter (Signed)
Medical Assistant left message on patient's home and cell voicemail. Voicemail states to give a call back to Nubia with [facility] at [phone].  

## 2015-12-12 NOTE — Telephone Encounter (Signed)
-----   Message from Maren Reamer, MD sent at 12/12/2015 11:12 AM EDT ----- Please call pt and tell her the Left le [place] was negative for blood clot.  She does have a small 1.6x3 cm baker's cyst that can sometimes cause swelling of leg. No intervention needed. thanks

## 2016-01-08 ENCOUNTER — Encounter: Payer: Self-pay | Admitting: Family Medicine

## 2016-01-08 MED FILL — METOPROLOL TARTRATE 50 MG T: 50 | 30 days supply | Qty: 60 | Fill #3

## 2016-01-08 MED FILL — ESCITALOPRAM 20 MG TABLET: 20 | 30 days supply | Qty: 30 | Fill #2

## 2016-01-08 MED FILL — VIT D2 1.25 MG (50,000 UNIT: 1.25 MG | 4 days supply | Qty: 4 | Fill #1

## 2016-01-08 MED FILL — FERROUS SULFATE 325 MG TAB: 325 (65 FE) | 30 days supply | Qty: 30 | Fill #2

## 2016-01-08 MED FILL — POTASSIUM CL ER 20 MEQ TAB: 20 | 30 days supply | Qty: 60 | Fill #1

## 2016-01-08 MED FILL — LISINOPRIL 40 MG TABLET: 40 | 30 days supply | Qty: 30 | Fill #2

## 2016-01-08 MED FILL — CHLORTHALIDONE 25 MG TABLET: 25 | 30 days supply | Qty: 60 | Fill #2

## 2016-01-17 ENCOUNTER — Ambulatory Visit: Payer: BLUE CROSS/BLUE SHIELD | Admitting: Sports Medicine

## 2016-01-22 ENCOUNTER — Encounter: Payer: Self-pay | Admitting: Sports Medicine

## 2016-01-22 ENCOUNTER — Ambulatory Visit (INDEPENDENT_AMBULATORY_CARE_PROVIDER_SITE_OTHER): Payer: BLUE CROSS/BLUE SHIELD | Admitting: Sports Medicine

## 2016-01-22 VITALS — BP 116/82 | Ht 66.0 in | Wt 270.0 lb

## 2016-01-22 DIAGNOSIS — M25562 Pain in left knee: Secondary | ICD-10-CM | POA: Diagnosis not present

## 2016-01-22 MED ORDER — NAPROXEN 500 MG PO TABS: ORAL_TABLET | ORAL | Status: DC

## 2016-01-22 MED ORDER — METHYLPREDNISOLONE ACETATE 40 MG/ML IJ SUSP
40.0000 mg | Freq: Once | INTRAMUSCULAR | Status: AC
  Administered 2016-01-22: 40 mg via INTRA_ARTICULAR

## 2016-01-22 MED FILL — NAPROXEN 500 MG TABLET: 500 | 30 days supply | Qty: 60 | Fill #0

## 2016-01-22 NOTE — Progress Notes (Signed)
   Subjective:    Patient ID: Theresa Pearson, female    DOB: [birth year], 50 y.o.   MRN: [id number]  HPI   Patient comes in today complaining of persistent left knee pain. X-rays of her knees done in April showed some patellofemoral DJD. 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 She has been taking 2 over-the-counter Aleve with some symptom resolution. She had an acute onset of pain while working in [place] 5-6 weeks ago. She was seen in the emergency room and diagnosed with a ruptured Baker's cyst. She was placed into a knee immobilizer. She still has persistent knee pain which she localizes primarily to the medial joint space. No catching or popping but she does feel like the knee wants to give way.    Review of Systems     Objective:   Physical Exam  Obese. No acute distress. Awake alert and oriented 3.  Left knee: Range of motion is 0-100 degrees. 1+ boggy synovitis. She is tender to palpation along the medial and lateral joint lines. Positive Thessaly's medially. Good joint stability. No palpable Baker's cyst. Neurovascularly intact distally. Walking with a slight limp.  X-rays are as above       Assessment & Plan:   Persistent left knee pain secondary to patellofemoral DJD versus degenerative meniscal tear  I've elected to inject her knee with cortisone today. This is done using an anterior lateral approach. She tolerates this without difficulty. I will give her a prescription for naproxen sodium 500 mg with instructions to take it twice daily for 7 days then when necessary. I have also given her a double upright brace to wear with activity. I'm going to proceed with an MRI scan of the left knee specifically to rule out a meniscal tear and I will call her with those results once available. We will delineate further treatment based on her MRI findings and response to today's injection. I also gave her a note excusing her from  work both today and tomorrow.  Consent obtained and verified. Time-out conducted. Noted no overlying erythema, induration, or other signs of local infection. Skin prepped in a sterile fashion. Topical analgesic spray: Ethyl chloride. Joint: left knee Needle: 22g 1.5 inch Completed without difficulty. Meds: 3cc 1% xylocaine, 1cc (40mg ) depomedrol  Advised to call if fevers/chills, erythema, induration, drainage, or persistent bleeding.

## 2016-01-28 ENCOUNTER — Ambulatory Visit
Admission: RE | Admit: 2016-01-28 | Discharge: 2016-01-28 | Disposition: A | Discharge status: Home / Self Care | Payer: BLUE CROSS/BLUE SHIELD | Source: Ambulatory Visit | Attending: Sports Medicine | Admitting: Sports Medicine

## 2016-01-28 DIAGNOSIS — M25562 Pain in left knee: Secondary | ICD-10-CM

## 2016-01-29 ENCOUNTER — Telehealth: Payer: Self-pay | Admitting: Sports Medicine

## 2016-01-29 NOTE — Telephone Encounter (Signed)
I spoke with the patient on the phone today after reviewing the MRI of her left knee. She has a radial tear of the posterior horn of the medial meniscus with peripheral meniscal extrusion. She also has moderate tricompartmental degenerative changes. She is doing better after a recent cortisone injection, naproxen sodium, and bracing. I want her to follow-up with me in 3-4 weeks for reevaluation. If her symptoms persist or worsen, then we may need to consider referral for meniscectomy and chondroplasty.

## 2016-03-11 ENCOUNTER — Other Ambulatory Visit: Payer: Self-pay | Admitting: Family Medicine

## 2016-03-11 ENCOUNTER — Other Ambulatory Visit: Payer: Self-pay | Admitting: Pharmacist

## 2016-03-11 ENCOUNTER — Other Ambulatory Visit: Payer: Self-pay | Admitting: Physician Assistant

## 2016-03-11 DIAGNOSIS — E559 Vitamin D deficiency, unspecified: Secondary | ICD-10-CM

## 2016-03-11 DIAGNOSIS — I10 Essential (primary) hypertension: Secondary | ICD-10-CM

## 2016-03-11 MED ORDER — METOPROLOL TARTRATE 50 MG PO TABS: 50.0000 mg | ORAL_TABLET | Freq: Two times a day (BID) | ORAL | 0 refills | Status: DC

## 2016-03-11 MED FILL — FERROUS SULFATE 325 MG TAB: 325 (65 FE) | 30 days supply | Qty: 30 | Fill #3

## 2016-03-11 MED FILL — METOPROLOL TARTRATE 50 MG T: 50 | 30 days supply | Qty: 60 | Fill #0

## 2016-03-11 MED FILL — LISINOPRIL 40 MG TABLET: 40 | 30 days supply | Qty: 30 | Fill #0

## 2016-03-11 MED FILL — NAPROXEN 500 MG TABLET: 500 | 30 days supply | Qty: 60 | Fill #1

## 2016-03-11 MED FILL — ESCITALOPRAM 20 MG TABLET: 20 | 30 days supply | Qty: 30 | Fill #3

## 2016-03-11 MED FILL — POTASSIUM CL ER 20 MEQ TAB: 20 | 30 days supply | Qty: 60 | Fill #2

## 2016-03-11 MED FILL — CHLORTHALIDONE 25 MG TABLET: 25 | 30 days supply | Qty: 60 | Fill #0

## 2016-03-26 ENCOUNTER — Ambulatory Visit: Payer: BLUE CROSS/BLUE SHIELD | Attending: Family Medicine | Admitting: Physician Assistant

## 2016-03-26 VITALS — BP 111/72 | HR 74 | Temp 98.6°F | Ht 65.0 in | Wt 276.4 lb

## 2016-03-26 DIAGNOSIS — M1990 Unspecified osteoarthritis, unspecified site: Secondary | ICD-10-CM | POA: Diagnosis present

## 2016-03-26 DIAGNOSIS — H6120 Impacted cerumen, unspecified ear: Secondary | ICD-10-CM | POA: Diagnosis not present

## 2016-03-26 DIAGNOSIS — J069 Acute upper respiratory infection, unspecified: Secondary | ICD-10-CM | POA: Diagnosis not present

## 2016-03-26 DIAGNOSIS — I10 Essential (primary) hypertension: Secondary | ICD-10-CM | POA: Diagnosis present

## 2016-03-26 MED ORDER — AZITHROMYCIN 250 MG PO TABS: ORAL_TABLET | ORAL | 0 refills | Status: DC

## 2016-03-26 MED ORDER — EAR DROPS EARWAX AID 6.5 % OT SOLN: 5.0000 [drp] | Freq: Two times a day (BID) | OTIC | 0 refills | Status: DC

## 2016-03-26 MED FILL — AZITHROMYCIN 250 MG TABLET: 250 | 5 days supply | Qty: 6 | Fill #0

## 2016-03-26 MED FILL — EAR DROPS 6.5%: 6.5 | 20 days supply | Qty: 15 | Fill #0

## 2016-03-26 NOTE — Progress Notes (Signed)
Chief Complaint: Cold like sxs for a week  Subjective: His is a pleasant 50 year old female with a history of hypertension and degenerative joint disease. She states that over the holiday weekend she began getting cold-like symptoms. She describes chest congestion.She's had intermittent fevers and chills. She's had green sputum production. She has a cough. No SHOB or dyspnea. She's been day to see if we can further assis   ROS:  GEN: + fever or chills, denies change in weight Skin: denies lesions or rashes HEENT: denies headache, earache, epistaxis, sore throat, or neck pain LUNGS: denies SHOB, dyspnea, PND, orthopnea CV: denies CP or palpitations ABD: denies abd pain, N or V EXT: denies muscle spasms or swelling; no pain in lower ext, no weakness NEURO: denies numbness or tingling, denies sz, stroke or TIA   Objective:  Vitals:   03/26/16 1208  BP: 111/72  Pulse: 74  Temp: 98.6 F (37 C)  TempSrc: Oral  Weight: 276 lb 6.4 oz (125.4 kg)  Height: 5\' 5"  (1.651 m)    Physical Exam:  General: in no acute distress. HEENT: no pallor, no icterus, moist oral mucosa, no JVD, no lymphadenopathy; cerumen impaction bilaterally Heart: Normal  s1 &s2  Regular rate and rhythm, without murmurs, rubs, gallops. Lungs: Clear to auscultation bilaterally. Abdomen: Soft, nontender, nondistended, positive bowel sounds. Extremities: No clubbing cyanosis or edema with positive pedal pulses. Neuro: Alert, awake, oriented x3, nonfocal.  Pertinent Lab Results:none   Medications: Prior to Admission medications   Medication Sig Start Date End Date Taking? Authorizing Provider  albuterol (PROVENTIL HFA;VENTOLIN HFA) 108 (90 BASE) MCG/ACT inhaler Inhale 2 puffs into the lungs every 6 (six) hours as needed for wheezing or shortness of breath. 09/26/14  Yes Lorayne Marek, MD  chlorthalidone (HYGROTON) 25 MG tablet TAKE 2 TABLETS BY MOUTH DAILY. 03/11/16  Yes Josalyn Funches, MD  escitalopram (LEXAPRO)  20 MG tablet Take 1 tablet (20 mg total) by mouth daily. 10/16/15  Yes Josalyn Funches, MD  ferrous sulfate 325 (65 FE) MG tablet Take 1 tablet (325 mg total) by mouth daily with breakfast. 09/19/15  Yes Josalyn Funches, MD  lisinopril (PRINIVIL,ZESTRIL) 40 MG tablet TAKE 1 TABLET BY MOUTH DAILY. 03/11/16  Yes Josalyn Funches, MD  metoprolol (LOPRESSOR) 50 MG tablet Take 1 tablet (50 mg total) by mouth 2 (two) times daily. 03/11/16  Yes Josalyn Funches, MD  Multiple Vitamins-Minerals (MULTIVITAMIN PO) Take 1 tablet by mouth daily.   Yes Historical Provider, MD  naproxen (NAPROSYN) 500 MG tablet Take twice a day for seven days then as needed 01/22/16  Yes Timothy R Draper, DO  Potassium Chloride ER 20 MEQ TBCR Take 20 mEq by mouth 2 (two) times daily. 09/19/15  Yes Boykin Nearing, MD  azithromycin (ZITHROMAX) 250 MG tablet 2 pills today Then 1 pill daily for 4 days 03/26/16   Brayton Caves, PA-C  EAR DROPS EARWAX AID 6.5 % otic solution Place 5 drops into both ears 2 (two) times daily. 03/26/16   Keana Dueitt Daneil Dan, PA-C  hydrOXYzine (ATARAX/VISTARIL) 50 MG tablet Take 0.5-1 tablets (25-50 mg total) by mouth 3 (three) times daily as needed. Patient not taking: Reported on 03/26/2016 09/18/15   Boykin Nearing, MD  ofloxacin (OCUFLOX) 0.3 % ophthalmic solution Place 1 drop into the right eye 4 (four) times daily. Patient not taking: Reported on 03/26/2016 12/05/15   Brayton Caves, PA-C  traMADol (ULTRAM) 50 MG tablet Take 1-2 tablets twice a day as needed for pain. Patient not taking:  Reported on 12/05/2015 10/31/15   Thurman Coyer, DO  Vitamin D, Ergocalciferol, (DRISDOL) 50000 units CAPS capsule Take 1 capsule (50,000 Units total) by mouth every 7 (seven) days. For 8 weeks Patient not taking: Reported on 03/26/2016 12/05/15   Brayton Caves, PA-C    Assessment: 1. URI 2. Cerumen impaction   Plan: Z pak Debrox drops  Follow up:as scheduled  The patient was given clear instructions to go to ER or return to  medical center if symptoms don't improve, worsen or new problems develop. The patient verbalized understanding. The patient was told to call to get lab results if they haven't heard anything in the next week.   This note has been created with Surveyor, quantity. Any transcriptional errors are unintentional.   Zettie Pho, PA-C 03/26/2016, 12:31 PM

## 2016-03-26 NOTE — Progress Notes (Signed)
Chills and body aches  Med refill- vit D, ear wax solution

## 2016-03-28 ENCOUNTER — Ambulatory Visit: Payer: BLUE CROSS/BLUE SHIELD | Admitting: Family Medicine

## 2016-04-17 ENCOUNTER — Encounter: Payer: Self-pay | Admitting: Obstetrics & Gynecology

## 2016-04-19 ENCOUNTER — Other Ambulatory Visit: Payer: Self-pay | Admitting: Family Medicine

## 2016-04-19 DIAGNOSIS — F411 Generalized anxiety disorder: Secondary | ICD-10-CM

## 2016-04-19 MED FILL — CHLORTHALIDONE 25 MG TABLET: 25 | 30 days supply | Qty: 60 | Fill #0

## 2016-04-19 MED FILL — ESCITALOPRAM 20 MG TABLET: 20 | 30 days supply | Qty: 30 | Fill #0

## 2016-06-11 ENCOUNTER — Ambulatory Visit (INDEPENDENT_AMBULATORY_CARE_PROVIDER_SITE_OTHER): Payer: BLUE CROSS/BLUE SHIELD | Admitting: Sports Medicine

## 2016-06-11 ENCOUNTER — Encounter: Payer: Self-pay | Admitting: Sports Medicine

## 2016-06-11 VITALS — BP 152/94 | Ht 66.0 in | Wt 276.0 lb

## 2016-06-11 DIAGNOSIS — M23204 Derangement of unspecified medial meniscus due to old tear or injury, left knee: Secondary | ICD-10-CM

## 2016-06-11 MED ORDER — METHYLPREDNISOLONE ACETATE 40 MG/ML IJ SUSP
40.0000 mg | Freq: Once | INTRAMUSCULAR | Status: AC
  Administered 2016-06-11: 40 mg via INTRA_ARTICULAR

## 2016-06-11 NOTE — Progress Notes (Signed)
   Subjective:    Patient ID: Theresa Pearson, female    DOB: 1965/11/24, 50 y.o.   MRN: [id number]  HPI chief complaint: Left knee pain  Patient comes in today with returning left knee pain. An MRI of her left knee done in July of this year showed a radial tear through the posterior horn of the medial meniscus with moderate tricompartmental degenerative changes. A cortisone injection administered at that time provided her with 3 months of symptom relief. Her pain has now returned. She is requesting a repeat cortisone injection. Her pain is primarily along the lateral aspect of her knee and is worse with activity. It improves at rest. She has not noticed any swelling but does get stiffness with prolonged sitting. Her knee recently buckled due to her pain causing her to fall. She takes naproxen sodium intermittently but it only provides mild symptom relief.    Review of Systems    as above Objective:   Physical Exam  Obese. No acute distress  Left knee: Range of motion is 0-120. Trace to 1+ effusion. She is tender to palpation along both medial and lateral joint lines. Positive Thessaly's. Good joint stability. Neurovascularly intact distally. Walking with a slight limp.  MRI of the left knee is as above      Assessment & Plan:   Returning left knee pain secondary to meniscal tear with concomitant DJD  I've agreed to reinject her left knee today with cortisone. This was done atraumatically under sterile technique utilizing an anterior lateral approach. She tolerates this without difficulty. She understands that she will probably need an arthroscopy in the very near future. She is thinking about having this done in February. In the meantime she will continue with her naproxen sodium as needed. Follow-up with me prn.  Consent obtained and verified. Time-out conducted. Noted no overlying erythema, induration, or other signs of local infection. Skin prepped in a sterile fashion. Topical  analgesic spray: Ethyl chloride. Joint: left knee Needle: 22g 1.5 inch Completed without difficulty. Meds: 3cc 1% xylocaine, 1cc (40mg ) depomedrol  Advised to call if fevers/chills, erythema, induration, drainage, or persistent bleeding.

## 2016-06-21 ENCOUNTER — Encounter: Payer: Self-pay | Admitting: Gastroenterology

## 2016-06-21 ENCOUNTER — Encounter: Payer: Self-pay | Admitting: Family Medicine

## 2016-06-21 ENCOUNTER — Ambulatory Visit: Payer: BLUE CROSS/BLUE SHIELD | Attending: Family Medicine | Admitting: Family Medicine

## 2016-06-21 VITALS — BP 148/102 | HR 79 | Temp 98.0°F | Ht 66.0 in | Wt 282.4 lb

## 2016-06-21 DIAGNOSIS — R1013 Epigastric pain: Secondary | ICD-10-CM | POA: Insufficient documentation

## 2016-06-21 DIAGNOSIS — K921 Melena: Secondary | ICD-10-CM | POA: Insufficient documentation

## 2016-06-21 DIAGNOSIS — Z0000 Encounter for general adult medical examination without abnormal findings: Secondary | ICD-10-CM

## 2016-06-21 DIAGNOSIS — Z9049 Acquired absence of other specified parts of digestive tract: Secondary | ICD-10-CM | POA: Diagnosis not present

## 2016-06-21 DIAGNOSIS — I10 Essential (primary) hypertension: Secondary | ICD-10-CM | POA: Insufficient documentation

## 2016-06-21 DIAGNOSIS — Z113 Encounter for screening for infections with a predominantly sexual mode of transmission: Secondary | ICD-10-CM | POA: Insufficient documentation

## 2016-06-21 DIAGNOSIS — H6123 Impacted cerumen, bilateral: Secondary | ICD-10-CM | POA: Diagnosis not present

## 2016-06-21 DIAGNOSIS — R197 Diarrhea, unspecified: Secondary | ICD-10-CM

## 2016-06-21 LAB — CBC
HCT: 35.8 % (ref 35.0–45.0)
Hemoglobin: 11.3 g/dL — ABNORMAL LOW (ref 11.7–15.5)
MCH: 22.7 pg — AB (ref 27.0–33.0)
MCHC: 31.6 g/dL — AB (ref 32.0–36.0)
MCV: 71.9 fL — ABNORMAL LOW (ref 80.0–100.0)
MPV: 11.1 fL (ref 7.5–12.5)
PLATELETS: 385 10*3/uL (ref 140–400)
RBC: 4.98 MIL/uL (ref 3.80–5.10)
RDW: 17.8 % — ABNORMAL HIGH (ref 11.0–15.0)
WBC: 13.2 10*3/uL — AB (ref 3.8–10.8)

## 2016-06-21 LAB — COMPLETE METABOLIC PANEL WITH GFR
ALT: 8 U/L (ref 6–29)
AST: 11 U/L (ref 10–35)
Albumin: 3.5 g/dL — ABNORMAL LOW (ref 3.6–5.1)
Alkaline Phosphatase: 75 U/L (ref 33–115)
BUN: 19 mg/dL (ref 7–25)
CHLORIDE: 104 mmol/L (ref 98–110)
CO2: 27 mmol/L (ref 20–31)
Calcium: 9.2 mg/dL (ref 8.6–10.2)
Creat: 0.68 mg/dL (ref 0.50–1.10)
GFR, Est African American: >89 mL/min (ref >=60)
Glucose, Bld: 76 mg/dL (ref 65–99)
POTASSIUM: 3.9 mmol/L (ref 3.5–5.3)
SODIUM: 141 mmol/L (ref 135–146)
Total Bilirubin: 0.2 mg/dL (ref 0.2–1.2)
Total Protein: 6.5 g/dL (ref 6.1–8.1)

## 2016-06-21 MED ORDER — METOPROLOL TARTRATE 50 MG PO TABS
50.0000 mg | ORAL_TABLET | Freq: Two times a day (BID) | ORAL | 11 refills | Status: DC
Start: 2016-06-21 — End: 2016-09-02

## 2016-06-21 MED ORDER — CHLORTHALIDONE 25 MG PO TABS: 50.0000 mg | ORAL_TABLET | Freq: Every day | ORAL | 11 refills | Status: DC

## 2016-06-21 MED ORDER — SPIRONOLACTONE 25 MG PO TABS: 25.0000 mg | ORAL_TABLET | Freq: Every day | ORAL | 2 refills | Status: DC

## 2016-06-21 MED ORDER — LISINOPRIL 40 MG PO TABS: 40.0000 mg | ORAL_TABLET | Freq: Every day | ORAL | 11 refills | Status: DC

## 2016-06-21 NOTE — Progress Notes (Signed)
Pt is here today to follow up on HTN.  Pt is getting tdap and flu today.

## 2016-06-21 NOTE — Progress Notes (Signed)
Subjective:  Patient ID: Theresa Pearson, female    DOB: 1966-02-23  Age: 50 y.o. MRN: [id number]  CC: Hypertension   HPI Theresa Pearson presents for    1. CHRONIC HYPERTENSION  Disease Monitoring  Blood pressure range: not checking   Chest pain: no   Dyspnea: no   Claudication: no   Medication compliance: no, not taking chlorthalidone  Medication Side Effects  Lightheadedness: no   Urinary frequency: no   Edema: no   2. Stomach problems: recurrent diarrhea and abdominal pain. Pain is upper abdomen.  After eating non-chicken protein for the past 6 weeks. No nausea or vomiting. She is s/p cholecystectomy. She has hx of diverticulosis. She has noticed blood in her stool. The last episode of diarrhea with blood in stool occurred 10 days ago. She has a fam hx of non-cancerous colon polyps in her mother and sister. She has never had a c-scope  3. Cerumen impaction: not using debrox recently. Request ENT referral for wax removal.   4. STI screen: she request STI screen. Reports last partner was unfaithful. Denies vaginal discharge or lesions.   Social History   Social History Narrative  . No narrative on file    Outpatient Medications Prior to Visit  Medication Sig Dispense Refill  . albuterol (PROVENTIL HFA;VENTOLIN HFA) 108 (90 BASE) MCG/ACT inhaler Inhale 2 puffs into the lungs every 6 (six) hours as needed for wheezing or shortness of breath. 3 Inhaler 3  . chlorthalidone (HYGROTON) 25 MG tablet TAKE 2 TABLETS BY MOUTH DAILY. 60 tablet 3  . EAR DROPS EARWAX AID 6.5 % otic solution Place 5 drops into both ears 2 (two) times daily. 15 mL 0  . escitalopram (LEXAPRO) 20 MG tablet TAKE 1 TABLET BY MOUTH DAILY 30 tablet 3  . ferrous sulfate 325 (65 FE) MG tablet Take 1 tablet (325 mg total) by mouth daily with breakfast. 30 tablet 11  . hydrOXYzine (ATARAX/VISTARIL) 50 MG tablet Take 0.5-1 tablets (25-50 mg total) by mouth 3 (three) times daily as needed. 30 tablet 0  .  lisinopril (PRINIVIL,ZESTRIL) 40 MG tablet TAKE 1 TABLET BY MOUTH DAILY. 30 tablet 0  . metoprolol (LOPRESSOR) 50 MG tablet Take 1 tablet (50 mg total) by mouth 2 (two) times daily. 60 tablet 0  . Multiple Vitamins-Minerals (MULTIVITAMIN PO) Take 1 tablet by mouth daily.    . naproxen (NAPROSYN) 500 MG tablet Take twice a day for seven days then as needed 60 tablet 1  . Potassium Chloride ER 20 MEQ TBCR Take 20 mEq by mouth 2 (two) times daily. 60 tablet 3  . Vitamin D, Ergocalciferol, (DRISDOL) 50000 units CAPS capsule Take 1 capsule (50,000 Units total) by mouth every 7 (seven) days. For 8 weeks 8 capsule 0  . azithromycin (ZITHROMAX) 250 MG tablet 2 pills today Then 1 pill daily for 4 days (Patient not taking: Reported on 06/21/2016) 6 tablet 0  . ofloxacin (OCUFLOX) 0.3 % ophthalmic solution Place 1 drop into the right eye 4 (four) times daily. (Patient not taking: Reported on 06/21/2016) 5 mL 0  . traMADol (ULTRAM) 50 MG tablet Take 1-2 tablets twice a day as needed for pain. (Patient not taking: Reported on 06/21/2016) 60 tablet 1   No facility-administered medications prior to visit.     ROS Review of Systems  Constitutional: Negative for chills and fever.  Eyes: Negative for visual disturbance.  Respiratory: Negative for shortness of breath.   Cardiovascular: Positive for leg swelling. Negative for chest  pain.  Gastrointestinal: Positive for abdominal pain and diarrhea. Negative for abdominal distention, anal bleeding, blood in stool, constipation, nausea and rectal pain.  Musculoskeletal: Negative for arthralgias and back pain.  Skin: Negative for rash.  Allergic/Immunologic: Negative for immunocompromised state.  Hematological: Negative for adenopathy. Does not bruise/bleed easily.  Psychiatric/Behavioral: Negative for dysphoric mood and suicidal ideas.    Objective:  BP (!) 148/102 (BP Location: Left Arm, Patient Position: Sitting, Cuff Size: Small)   Pulse 79   Temp 98 F (36.7  C) (Oral)   Ht 5\' 6"  (1.676 m)   Wt 282 lb 6.4 oz (128.1 kg)   SpO2 96%   BMI 45.58 kg/m   BP/Weight 06/21/2016 06/11/2016 0000000  Systolic BP 123456 0000000 [id number]  Diastolic BP [id number] 94 72  Wt. (Lbs) 282.4 276 276.4  BMI 45.58 44.55 46    Physical Exam  Constitutional: She is oriented to person, place, and time. She appears well-developed and well-nourished. No distress.  HENT:  Head: Normocephalic and atraumatic.  Ears:  Cardiovascular: Normal rate, regular rhythm, normal heart sounds and intact distal pulses.   Pulmonary/Chest: Effort normal and breath sounds normal.  Abdominal: Soft. Bowel sounds are normal. She exhibits no distension and no mass. There is no tenderness. There is no rebound and no guarding.  Genitourinary: Rectal exam shows guaiac negative stool.  Musculoskeletal: She exhibits no edema.  Neurological: She is alert and oriented to person, place, and time.  Skin: Skin is warm and dry. No rash noted.  Psychiatric: She has a normal mood and affect.    Assessment & Plan:  Theresa Pearson was seen today for hypertension.  Diagnoses and all orders for this visit:  Routine screening for STI (sexually transmitted infection) -     HIV antibody (with reflex) -     RPR -     Urine cytology ancillary only  Essential hypertension -     lisinopril (PRINIVIL,ZESTRIL) 40 MG tablet; Take 1 tablet (40 mg total) by mouth daily. -     metoprolol (LOPRESSOR) 50 MG tablet; Take 1 tablet (50 mg total) by mouth 2 (two) times daily. -     Discontinue: chlorthalidone (HYGROTON) 25 MG tablet; Take 2 tablets (50 mg total) by mouth daily. -     spironolactone (ALDACTONE) 25 MG tablet; Take 1 tablet (25 mg total) by mouth daily. -     COMPLETE METABOLIC PANEL WITH GFR  Epigastric pain -     Cancel: Fecal Occult Blood, Guaiac -     CBC -     Fecal Occult Blood, Guaiac  Healthcare maintenance -     Ambulatory referral to Gastroenterology  Bloody diarrhea -     CBC -     Fecal Occult  Blood, Guaiac  Bilateral impacted cerumen   There are no diagnoses linked to this encounter.  Meds ordered this encounter  Medications  . lisinopril (PRINIVIL,ZESTRIL) 40 MG tablet    Sig: Take 1 tablet (40 mg total) by mouth daily.    Dispense:  30 tablet    Refill:  11  . metoprolol (LOPRESSOR) 50 MG tablet    Sig: Take 1 tablet (50 mg total) by mouth 2 (two) times daily.    Dispense:  60 tablet    Refill:  11  . DISCONTD: chlorthalidone (HYGROTON) 25 MG tablet    Sig: Take 2 tablets (50 mg total) by mouth daily.    Dispense:  60 tablet    Refill:  11  . spironolactone (  ALDACTONE) 25 MG tablet    Sig: Take 1 tablet (25 mg total) by mouth daily.    Dispense:  30 tablet    Refill:  2    Dc chlorthalidone    Follow-up: Return in about 2 weeks (around 07/05/2016) for HTN .   Boykin Nearing MD

## 2016-06-21 NOTE — Patient Instructions (Addendum)
Theresa Pearson was seen today for hypertension.  Diagnoses and all orders for this visit:  Routine screening for STI (sexually transmitted infection) -     HIV antibody (with reflex) -     RPR -     Urine cytology ancillary only  Essential hypertension -     lisinopril (PRINIVIL,ZESTRIL) 40 MG tablet; Take 1 tablet (40 mg total) by mouth daily. -     metoprolol (LOPRESSOR) 50 MG tablet; Take 1 tablet (50 mg total) by mouth 2 (two) times daily. -     Discontinue: chlorthalidone (HYGROTON) 25 MG tablet; Take 2 tablets (50 mg total) by mouth daily. -     spironolactone (ALDACTONE) 25 MG tablet; Take 1 tablet (25 mg total) by mouth daily. -     COMPLETE METABOLIC PANEL WITH GFR  Epigastric pain -     Cancel: Fecal Occult Blood, Guaiac -     CBC -     Fecal Occult Blood, Guaiac  Healthcare maintenance -     Ambulatory referral to Gastroenterology  Bloody diarrhea -     CBC -     Fecal Occult Blood, Guaiac   Replace chlorthalidone with spironolactone Stop potassium supplement  Stool card negative for blood Avoid proteins that cause stomach upset  Your will be contacted about referral   F/u in 2 weeks for HTN  Dr. Adrian Blackwater

## 2016-06-22 LAB — HIV ANTIBODY (ROUTINE TESTING W REFLEX): HIV: NONREACTIVE

## 2016-06-22 LAB — RPR

## 2016-06-24 DIAGNOSIS — R1013 Epigastric pain: Secondary | ICD-10-CM | POA: Insufficient documentation

## 2016-06-24 DIAGNOSIS — R197 Diarrhea, unspecified: Secondary | ICD-10-CM | POA: Insufficient documentation

## 2016-06-24 NOTE — Assessment & Plan Note (Signed)
Epigastric pain with diarrhea, heme negative stools today, last diarrhea episode was over 1 week ago  CBC with no anemia, slightly elevated WBC Suspect viral GI illness Advised avoidance of food triggers GI referral for C-scope, patient has never been screened

## 2016-06-24 NOTE — Assessment & Plan Note (Signed)
A: Elevated Med: non compliant.  P: Continue lisinopril and metoprolol Change from chlorthalidone to aldactone due to HTN and history of low K+  Stop K+ supplement CMP today

## 2016-06-24 NOTE — Assessment & Plan Note (Signed)
ENT referral

## 2016-06-25 LAB — URINE CYTOLOGY ANCILLARY ONLY
CHLAMYDIA, DNA PROBE: NEGATIVE
NEISSERIA GONORRHEA: NEGATIVE
TRICH (WINDOWPATH): NEGATIVE

## 2016-07-01 ENCOUNTER — Ambulatory Visit (INDEPENDENT_AMBULATORY_CARE_PROVIDER_SITE_OTHER): Payer: BLUE CROSS/BLUE SHIELD | Admitting: Gastroenterology

## 2016-07-01 ENCOUNTER — Other Ambulatory Visit (INDEPENDENT_AMBULATORY_CARE_PROVIDER_SITE_OTHER): Payer: BLUE CROSS/BLUE SHIELD

## 2016-07-01 ENCOUNTER — Encounter: Payer: Self-pay | Admitting: Gastroenterology

## 2016-07-01 VITALS — BP 150/96 | HR 68 | Ht 65.0 in | Wt 285.0 lb

## 2016-07-01 DIAGNOSIS — K921 Melena: Secondary | ICD-10-CM | POA: Diagnosis not present

## 2016-07-01 DIAGNOSIS — D649 Anemia, unspecified: Secondary | ICD-10-CM | POA: Diagnosis not present

## 2016-07-01 DIAGNOSIS — R194 Change in bowel habit: Secondary | ICD-10-CM | POA: Diagnosis not present

## 2016-07-01 DIAGNOSIS — D582 Other hemoglobinopathies: Secondary | ICD-10-CM

## 2016-07-01 DIAGNOSIS — K219 Gastro-esophageal reflux disease without esophagitis: Secondary | ICD-10-CM

## 2016-07-01 DIAGNOSIS — D509 Iron deficiency anemia, unspecified: Secondary | ICD-10-CM

## 2016-07-01 LAB — IRON AND TIBC
%SAT: 14 % (ref 11–50)
Iron: 44 ug/dL (ref 40–190)
TIBC: 321 ug/dL (ref 250–450)
UIBC: 277 ug/dL (ref 125–400)

## 2016-07-01 LAB — FERRITIN: FERRITIN: 50 ng/mL (ref 10.0–291.0)

## 2016-07-01 MED ORDER — OMEPRAZOLE 20 MG PO CPDR: 20.0000 mg | DELAYED_RELEASE_CAPSULE | Freq: Every day | ORAL | 3 refills | Status: DC

## 2016-07-01 MED ORDER — COLESTIPOL HCL 1 G PO TABS: 1.0000 g | ORAL_TABLET | Freq: Two times a day (BID) | ORAL | 3 refills | Status: DC

## 2016-07-01 MED ORDER — DICYCLOMINE HCL 10 MG PO CAPS: 10.0000 mg | ORAL_CAPSULE | Freq: Three times a day (TID) | ORAL | 3 refills | Status: DC | PRN

## 2016-07-01 MED ORDER — NA SULFATE-K SULFATE-MG SULF 17.5-3.13-1.6 GM/177ML PO SOLN: 1.0000 | Freq: Once | ORAL | 0 refills | Status: AC

## 2016-07-01 NOTE — Patient Instructions (Signed)
If you are age 49 or older, your body mass index should be between 23-30. Your Body mass index is 47.43 kg/m. If this is out of the aforementioned range listed, please consider follow up with your Primary Care Provider.  If you are age 37 or younger, your body mass index should be between 19-25. Your Body mass index is 47.43 kg/m. If this is out of the aformentioned range listed, please consider follow up with your Primary Care Provider.   We have sent the following medications to your pharmacy for you to pick up at your convenience:  [place]  Omeprazole  Your physician has requested that you go to the basement for lab work before leaving today.  You have been scheduled for an endoscopy and colonoscopy. Please follow the written instructions given to you at your visit today. Please pick up your prep supplies at the pharmacy within the next 1-3 days. If you use inhalers (even only as needed), please bring them with you on the day of your procedure. Your physician has requested that you go to www.startemmi.com and enter the access code given to you at your visit today. This web site gives a general overview about your procedure. However, you should still follow specific instructions given to you by our office regarding your preparation for the procedure.

## 2016-07-01 NOTE — Progress Notes (Signed)
HPI :  50 y/o AA female with a PMH of obesity, GERD, OSA, here in consultation for loose stools and blood in the stool.   She reports problems with her bowels since her gallbladder was removed in 1998 or so, with worsening over the last 7 months. She reports particular foods such as beef or pork cause her to have worsening symptoms and elicits urgency with loose bowel movement. At baseline when feeling well she has roughly 2 bowel movements per day, usually normal form. She does see blood in the stools when she has diarrhea, she has seen it in toilet bowel and in the stool. She reports roughly 4-5 episodes of diarrhea per month, usually triggered by foods but sometimes not. She has been told she has had diverticulitis in the past, about 5 years ago. Do not see prior Ct imaging in chart. She has never had a prior colonoscopy. She has some mid to epigastric area and abdominal discomfort with soreness. She has gained 50 lbs in the past year. Having a bowel movement can relieve some of her pains. No FH of colon cancer. She does have reflux symptoms which bothers her a few times per week. She has not taken anything for it. She is taking naproxen every night due to pain for torn meniscus.   She reports longstanding microcytic anemia, although remotely had normal iron levels few years ago. She has had a hysterectomy about 10 years ago. She has vitamin D deficiency. She takes Naprosyn every night for knee pain, does not take any GI prophylaxis.   Past Medical History:  Diagnosis Date  . Anemia   . Anxiety   . Arthritis   . Asthma   . Chronic headaches   . Depression   . Diverticulosis   . Fibromyalgia   . Gallstones   . GERD (gastroesophageal reflux disease)   . Hypertension   . Pneumonia   . Right leg DVT ([place])   . Sleep apnea      Past Surgical History:  Procedure Laterality Date  . ABDOMINAL HYSTERECTOMY  2008   for  fibroids   . CESAREAN SECTION    . CHOLECYSTECTOMY     Family  History  Problem Relation Age of Onset  . CAD Mother   . Diabetes Mellitus II Mother   . Stroke Mother   . Colon polyps Mother   . Crohn's disease Mother   . Allergy (severe) Sister   . Colon polyps Sister   . Allergy (severe) Son   . Prostate cancer Maternal Grandfather   . Diabetes Maternal Grandmother   . Hypertension Maternal Grandmother   . Heart attack Maternal Grandmother    Social History  Substance Use Topics  . Smoking status: Never Smoker  . Smokeless tobacco: Never Used  . Alcohol use 0.6 - 1.2 oz/week    1 - 2 Glasses of wine per week     Comment: occoasional.   Current Outpatient Prescriptions  Medication Sig Dispense Refill  . albuterol (PROVENTIL HFA;VENTOLIN HFA) 108 (90 BASE) MCG/ACT inhaler Inhale 2 puffs into the lungs every 6 (six) hours as needed for wheezing or shortness of breath. 3 Inhaler 3  . EAR DROPS EARWAX AID 6.5 % otic solution Place 5 drops into both ears 2 (two) times daily. 15 mL 0  . ferrous sulfate 325 (65 FE) MG tablet Take 1 tablet (325 mg total) by mouth daily with breakfast. 30 tablet 11  . hydrOXYzine (ATARAX/VISTARIL) 50 MG tablet Take 0.5-1  tablets (25-50 mg total) by mouth 3 (three) times daily as needed. 30 tablet 0  . lisinopril (PRINIVIL,ZESTRIL) 40 MG tablet Take 1 tablet (40 mg total) by mouth daily. 30 tablet 11  . metoprolol (LOPRESSOR) 50 MG tablet Take 1 tablet (50 mg total) by mouth 2 (two) times daily. 60 tablet 11  . Multiple Vitamins-Minerals (MULTIVITAMIN PO) Take 1 tablet by mouth daily.    . naproxen (NAPROSYN) 500 MG tablet Take twice a day for seven days then as needed 60 tablet 1  . spironolactone (ALDACTONE) 25 MG tablet Take 1 tablet (25 mg total) by mouth daily. 30 tablet 2  . Vitamin D, Ergocalciferol, (DRISDOL) 50000 units CAPS capsule Take 1 capsule (50,000 Units total) by mouth every 7 (seven) days. For 8 weeks 8 capsule 0  . escitalopram (LEXAPRO) 20 MG tablet TAKE 1 TABLET BY MOUTH DAILY (Patient not taking:  Reported on 07/01/2016) 30 tablet 3   No current facility-administered medications for this visit.    Allergies  Allergen Reactions  . Penicillins Shortness Of Breath and Rash     Review of Systems: All systems reviewed and negative except where noted in HPI.   Lab Results  Component Value Date   WBC 13.2 (H) 06/21/2016   HGB 11.3 (L) 06/21/2016   HCT 35.8 06/21/2016   MCV 71.9 (L) 06/21/2016   PLT 385 06/21/2016    Lab Results  Component Value Date   CREATININE 0.68 06/21/2016   BUN 19 06/21/2016   NA 141 06/21/2016   K 3.9 06/21/2016   CL 104 06/21/2016   CO2 27 06/21/2016    Lab Results  Component Value Date   ALT 8 06/21/2016   AST 11 06/21/2016   ALKPHOS 75 06/21/2016   BILITOT 0.2 06/21/2016    Lab Results  Component Value Date   IRON 63 06/28/2013   TIBC 298 06/28/2013   FERRITIN 127 06/28/2013     Physical Exam: BP (!) 150/96 (BP Location: Left Arm, Patient Position: Sitting, Cuff Size: Large)   Pulse 68   Ht 5\' 5"  (1.651 m) Comment: height measured without shoes  Wt 285 lb (129.3 kg)   BMI 47.43 kg/m  Constitutional: Pleasant,well-developed, female in no acute distress. HEENT: Normocephalic and atraumatic. Conjunctivae are normal. No scleral icterus. Neck supple.  Cardiovascular: Normal rate, regular rhythm.  Pulmonary/chest: Effort normal and breath sounds normal. No wheezing, rales or rhonchi. Abdominal: Soft, protuberant, nontender.  There are no masses palpable. No hepatomegaly. Extremities: no edema Lymphadenopathy: No cervical adenopathy noted. Neurological: Alert and oriented to person place and time. Skin: Skin is warm and dry. No rashes noted. Psychiatric: Normal mood and affect. Behavior is normal.   ASSESSMENT AND PLAN: 50 year old AA female with medical history as outlined above here for new patient evaluation regarding the following issues:  Hematochezia / change in bowel habits / abdominal pain - she is overdue for colon  cancer screening given her ethnicity. Colonoscopy is recommended for screening purposes and ensure no bleeding polyp or mass lesion, and rule out inflammatory changes regarding her symptoms. It's possible her bleeding could otherwise be due to hemorrhoids which we discussed. I discussed risks benefits of colonoscopy with her and she wished proceed. Regarding her loose stools suspect this could be related to her cholecystectomy given time course. We will start her on Colestid 1 g twice a day, and trial of Bentyl to use as needed for bowel spasm.  Microcytic anemia - status post hysterectomy. This appears to be  long-standing issue although we'll recheck iron levels to ensure normal, as they were normal 3-4 years ago, is possible she could also have thalassemia, and will evaluate with hemoglobin electrophoresis. If iron deficient will need upper endoscopy in addition to colonoscopy. I otherwise recommended she avoid all NSAIDs and use Tylenol as needed for pain given her anemia while this workup is ongoing.  GERD - in light of her routine NSAID use, recommend she stop NSAIDs and will give her a trial of omeprazole 20 mg once daily to see if this helps her GERD as well as epigastric discomfort. If she is iron deficient we will also proceed with upper endoscopy.  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 We discussed options for this I offered her a nutritional consult which she declined, she prefers to try Weight Watchers initially which is reasonable. Once her knee is feeling better she should exercise routinely.  If he doesn't have success with dieting may consider medical options or referral for consideration for bariatric surgery. She agreed.  [place] Cellar, MD [facility] Pager [phone]  CC: Boykin Nearing, MD

## 2016-07-02 ENCOUNTER — Telehealth: Payer: Self-pay

## 2016-07-02 NOTE — Telephone Encounter (Signed)
-----   Message from Manus Gunning, MD sent at 07/01/2016  5:06 PM EST ----- Caryl Pina can you please let this patient know her iron studies are normal, thus I don't think she needs an EGD at this time. She is currently scheduled for EGD / colonoscopy, continue please take upper endoscopy off the schedule and let the patient know. Please let her know I'll contact her once the results of her other blood test is resulted. Can you let me know once you have reached her. Thanks

## 2016-07-02 NOTE — Telephone Encounter (Signed)
Thanks for contacting her

## 2016-07-02 NOTE — Telephone Encounter (Signed)
Pt informed of normal results. Cancelled Egd. Colon scheduled 08/19/16 at 3pm.

## 2016-07-03 LAB — HEMOGLOBINOPATHY EVALUATION
HEMOGLOBIN A2 QUANTITATION: 2.2 % (ref 1.8–3.2)
HGB A: 97.8 % (ref 96.4–98.8)
HGB C: 0 %
HGB S: 0 %
HGB VARIANT: 0 %
Hemoglobin F Quantitation: 0 % (ref 0.0–2.0)

## 2016-07-04 ENCOUNTER — Encounter: Payer: Self-pay | Admitting: Gastroenterology

## 2016-07-04 NOTE — Progress Notes (Signed)
Letter mailed

## 2016-07-11 ENCOUNTER — Ambulatory Visit: Payer: BLUE CROSS/BLUE SHIELD | Admitting: Family Medicine

## 2016-07-19 ENCOUNTER — Other Ambulatory Visit: Payer: Self-pay | Admitting: *Deleted

## 2016-07-19 DIAGNOSIS — M25562 Pain in left knee: Secondary | ICD-10-CM

## 2016-08-05 ENCOUNTER — Encounter: Payer: Self-pay | Admitting: Gastroenterology

## 2016-08-06 ENCOUNTER — Encounter (HOSPITAL_COMMUNITY): Payer: Self-pay | Admitting: Emergency Medicine

## 2016-08-06 ENCOUNTER — Emergency Department (HOSPITAL_COMMUNITY): Payer: BLUE CROSS/BLUE SHIELD

## 2016-08-06 DIAGNOSIS — J111 Influenza due to unidentified influenza virus with other respiratory manifestations: Secondary | ICD-10-CM | POA: Diagnosis not present

## 2016-08-06 DIAGNOSIS — I10 Essential (primary) hypertension: Secondary | ICD-10-CM | POA: Insufficient documentation

## 2016-08-06 DIAGNOSIS — J45909 Unspecified asthma, uncomplicated: Secondary | ICD-10-CM | POA: Diagnosis not present

## 2016-08-06 DIAGNOSIS — Z79899 Other long term (current) drug therapy: Secondary | ICD-10-CM | POA: Insufficient documentation

## 2016-08-06 DIAGNOSIS — R05 Cough: Secondary | ICD-10-CM | POA: Diagnosis present

## 2016-08-06 LAB — CBC WITH DIFFERENTIAL/PLATELET
BASOS ABS: 0 10*3/uL (ref 0.0–0.1)
Basophils Relative: 0 %
Eosinophils Absolute: 0.1 10*3/uL (ref 0.0–0.7)
Eosinophils Relative: 1 %
HEMATOCRIT: 35.4 % — AB (ref 36.0–46.0)
HEMOGLOBIN: 11.4 g/dL — AB (ref 12.0–15.0)
LYMPHS ABS: 1.9 10*3/uL (ref 0.7–4.0)
LYMPHS PCT: 19 %
MCH: 22.8 pg — ABNORMAL LOW (ref 26.0–34.0)
MCHC: 32.2 g/dL (ref 30.0–36.0)
MCV: 70.7 fL — AB (ref 78.0–100.0)
Monocytes Absolute: 0.8 10*3/uL (ref 0.1–1.0)
Monocytes Relative: 8 %
NEUTROS ABS: 7.5 10*3/uL (ref 1.7–7.7)
Neutrophils Relative %: 72 %
PLATELETS: 377 10*3/uL (ref 150–400)
RBC: 5.01 MIL/uL (ref 3.87–5.11)
RDW: 15.8 % — ABNORMAL HIGH (ref 11.5–15.5)
WBC: 10.3 10*3/uL (ref 4.0–10.5)

## 2016-08-06 LAB — COMPREHENSIVE METABOLIC PANEL
ALK PHOS: 66 U/L (ref 38–126)
ALT: 12 U/L — AB (ref 14–54)
AST: 14 U/L — AB (ref 15–41)
Albumin: 3.4 g/dL — ABNORMAL LOW (ref 3.5–5.0)
Anion gap: 10 (ref 5–15)
BILIRUBIN TOTAL: 0.2 mg/dL — AB (ref 0.3–1.2)
BUN: 12 mg/dL (ref 6–20)
CALCIUM: 9.2 mg/dL (ref 8.9–10.3)
CHLORIDE: 104 mmol/L (ref 101–111)
CO2: 23 mmol/L (ref 22–32)
CREATININE: 0.76 mg/dL (ref 0.44–1.00)
GFR calc Af Amer: >60 mL/min (ref >60)
GFR calc non Af Amer: >60 mL/min (ref >60)
Glucose, Bld: 107 mg/dL — ABNORMAL HIGH (ref 65–99)
Potassium: 3.7 mmol/L (ref 3.5–5.1)
Sodium: 137 mmol/L (ref 135–145)
Total Protein: 7.2 g/dL (ref 6.5–8.1)

## 2016-08-06 NOTE — ED Triage Notes (Signed)
Pt st's she has had generalized body aches for 2-3 days, chills, productive cough and elevated temp.

## 2016-08-07 ENCOUNTER — Emergency Department (HOSPITAL_COMMUNITY)
Admission: EM | Admit: 2016-08-07 | Discharge: 2016-08-07 | Disposition: A | Discharge status: Home / Self Care | Payer: BLUE CROSS/BLUE SHIELD | Attending: Emergency Medicine | Admitting: Emergency Medicine

## 2016-08-07 DIAGNOSIS — R69 Illness, unspecified: Secondary | ICD-10-CM

## 2016-08-07 DIAGNOSIS — J111 Influenza due to unidentified influenza virus with other respiratory manifestations: Secondary | ICD-10-CM

## 2016-08-07 DIAGNOSIS — R059 Cough, unspecified: Secondary | ICD-10-CM

## 2016-08-07 DIAGNOSIS — R05 Cough: Secondary | ICD-10-CM

## 2016-08-07 MED ORDER — FLUTICASONE PROPIONATE 50 MCG/ACT NA SUSP: 2.0000 | Freq: Every day | NASAL | 2 refills | Status: DC

## 2016-08-07 MED ORDER — AEROCHAMBER PLUS W/MASK MISC
1.0000 | Freq: Once | Status: AC
  Administered 2016-08-07: 1
  Filled 2016-08-07: qty 1

## 2016-08-07 MED ORDER — BENZONATATE 100 MG PO CAPS: 100.0000 mg | ORAL_CAPSULE | Freq: Three times a day (TID) | ORAL | 0 refills | Status: DC

## 2016-08-07 MED ORDER — ALBUTEROL SULFATE (2.5 MG/3ML) 0.083% IN NEBU
5.0000 mg | INHALATION_SOLUTION | Freq: Once | RESPIRATORY_TRACT | Status: AC
  Administered 2016-08-07: 5 mg via RESPIRATORY_TRACT
  Filled 2016-08-07: qty 6

## 2016-08-07 MED ORDER — ACETAMINOPHEN 500 MG PO TABS
1000.0000 mg | ORAL_TABLET | Freq: Once | ORAL | Status: AC
  Administered 2016-08-07: 1000 mg via ORAL
  Filled 2016-08-07: qty 2

## 2016-08-07 MED ORDER — IPRATROPIUM BROMIDE 0.02 % IN SOLN
0.5000 mg | Freq: Once | RESPIRATORY_TRACT | Status: AC
  Administered 2016-08-07: 0.5 mg via RESPIRATORY_TRACT
  Filled 2016-08-07: qty 2.5

## 2016-08-07 MED ORDER — ALBUTEROL SULFATE HFA 108 (90 BASE) MCG/ACT IN AERS
2.0000 | INHALATION_SPRAY | RESPIRATORY_TRACT | Status: DC | PRN
  Administered 2016-08-07: 2 via RESPIRATORY_TRACT
  Filled 2016-08-07: qty 6.7

## 2016-08-07 NOTE — ED Provider Notes (Signed)
[facility] DEPT Provider Note   CSN: [id number] Arrival date & time: 08/06/16  2100     History   Chief Complaint Chief Complaint  Patient presents with  . Generalized Body Aches    HPI Theresa Pearson is a 51 y.o. female with a hx of Anemia, anxiety, asthma, fibromyalgia, GERD, hypertension, DVT presents to the Emergency Department complaining of gradual, persistent, progressively worsening influenza-like illness onset 4 days ago. Patient reports associated cough, nasal congestion, sore throat, bilateral otalgia, subjective fevers, myalgias, chills and nausea without vomiting. She reports no palpitations, chest pain or shortness of breath. She does report a history of asthma and has been out of her inhalers. She reports some chest tightness over the last several days. Denies a history of intubation due to her asthma.  Patient does report using ibuprofen with some relief of her fevers. Nothing seems to make the symptoms worse. She did not receive a flu shot but she'll. No known sick contacts. The history is provided by the patient and medical records. No language interpreter was used.    Past Medical History:  Diagnosis Date  . Anemia   . Anxiety   . Arthritis   . Asthma   . Chronic headaches   . Depression   . Diverticulosis   . Fibromyalgia   . Gallstones   . GERD (gastroesophageal reflux disease)   . Hypertension   . Pneumonia   . Right leg DVT ([place])   . Sleep apnea     Patient Active Problem List   Diagnosis Date Noted  . Epigastric pain 06/24/2016  . Bloody diarrhea 06/24/2016  . Bilateral chronic knee pain 10/16/2015  . Severe obesity (BMI >= 40) ([place]) 10/16/2015  . Anemia 09/19/2015  . Vitamin D deficiency 09/19/2015  . Bilateral impacted cerumen 09/19/2015  . GAD (generalized anxiety disorder) 03/04/2014  . HTN (hypertension) 06/27/2013  . Sleep apnea 06/27/2013    Past Surgical History:  Procedure Laterality Date  . ABDOMINAL HYSTERECTOMY  2008   for   fibroids   . CESAREAN SECTION    . CHOLECYSTECTOMY      OB History    No data available       Home Medications    Prior to Admission medications   Medication Sig Start Date End Date Taking? Authorizing Provider  albuterol (PROVENTIL HFA;VENTOLIN HFA) 108 (90 BASE) MCG/ACT inhaler Inhale 2 puffs into the lungs every 6 (six) hours as needed for wheezing or shortness of breath. 09/26/14  Yes Lorayne Marek, MD  dicyclomine (BENTYL) 10 MG capsule Take 1-2 capsules (10-20 mg total) by mouth every 8 (eight) hours as needed for spasms. 07/01/16  Yes Manus Gunning, MD  hydrOXYzine (ATARAX/VISTARIL) 50 MG tablet Take 0.5-1 tablets (25-50 mg total) by mouth 3 (three) times daily as needed. Patient taking differently: Take 25-50 mg by mouth 3 (three) times daily as needed for anxiety.  09/18/15  Yes Josalyn Funches, MD  lisinopril (PRINIVIL,ZESTRIL) 40 MG tablet Take 1 tablet (40 mg total) by mouth daily. 06/21/16  Yes Josalyn Funches, MD  metoprolol (LOPRESSOR) 50 MG tablet Take 1 tablet (50 mg total) by mouth 2 (two) times daily. 06/21/16  Yes Josalyn Funches, MD  Multiple Vitamins-Minerals (MULTIVITAMIN PO) Take 1 tablet by mouth daily.   Yes Historical Provider, MD  naproxen (NAPROSYN) 500 MG tablet Take twice a day for seven days then as needed Patient taking differently: Take 500 mg by mouth 2 (two) times daily as needed for mild pain.  01/22/16  Yes Thurman Coyer, DO  omeprazole (PRILOSEC) 20 MG capsule Take 1 capsule (20 mg total) by mouth daily. Patient taking differently: Take 20 mg by mouth daily as needed (acid reflux).  07/01/16  Yes Manus Gunning, MD  spironolactone (ALDACTONE) 25 MG tablet Take 1 tablet (25 mg total) by mouth daily. 06/21/16  Yes Josalyn Funches, MD  benzonatate (TESSALON) 100 MG capsule Take 1 capsule (100 mg total) by mouth every 8 (eight) hours. 08/07/16   Luverne Zerkle, PA-C  escitalopram (LEXAPRO) 20 MG tablet TAKE 1 TABLET BY MOUTH  DAILY Patient not taking: Reported on 07/01/2016 04/19/16   Boykin Nearing, MD  fluticasone (FLONASE) 50 MCG/ACT nasal spray Place 2 sprays into both nostrils daily. 08/07/16   Jarrett Soho Allaya Abbasi, PA-C    Family History Family History  Problem Relation Age of Onset  . CAD Mother   . Diabetes Mellitus II Mother   . Stroke Mother   . Colon polyps Mother   . Crohn's disease Mother   . Allergy (severe) Sister   . Colon polyps Sister   . Allergy (severe) Son   . Prostate cancer Maternal Grandfather   . Diabetes Maternal Grandmother   . Hypertension Maternal Grandmother   . Heart attack Maternal Grandmother     Social History Social History  Substance Use Topics  . Smoking status: Never Smoker  . Smokeless tobacco: Never Used  . Alcohol use 0.6 - 1.2 oz/week    1 - 2 Glasses of wine per week     Comment: occoasional.     Allergies   Penicillins   Review of Systems Review of Systems  Constitutional: Positive for fatigue. Negative for appetite change, chills and fever.  HENT: Positive for congestion, postnasal drip, rhinorrhea, sinus pressure and sore throat. Negative for ear discharge, ear pain and mouth sores.   Eyes: Negative for visual disturbance.  Respiratory: Positive for cough and chest tightness. Negative for shortness of breath, wheezing and stridor.   Cardiovascular: Negative for chest pain, palpitations and leg swelling.  Gastrointestinal: Negative for abdominal pain, diarrhea, nausea and vomiting.  Genitourinary: Negative for dysuria, frequency, hematuria and urgency.  Musculoskeletal: Negative for arthralgias, back pain, myalgias and neck stiffness.  Skin: Negative for rash.  Neurological: Negative for syncope, light-headedness, numbness and headaches.  Hematological: Negative for adenopathy.  Psychiatric/Behavioral: The patient is not nervous/anxious.      Physical Exam Updated Vital Signs BP 160/91 (BP Location: Right Arm)   Pulse 99   Temp 99.5 F  (37.5 C) (Oral)   Resp 22   SpO2 99%   Physical Exam  Constitutional: She appears well-developed and well-nourished. No distress.  Awake, alert, nontoxic appearance  HENT:  Head: Normocephalic and atraumatic.  Right Ear: Tympanic membrane, external ear and ear canal normal.  Left Ear: Tympanic membrane, external ear and ear canal normal.  Nose: Mucosal edema and rhinorrhea present. No epistaxis. Right sinus exhibits no maxillary sinus tenderness and no frontal sinus tenderness. Left sinus exhibits no maxillary sinus tenderness and no frontal sinus tenderness.  Mouth/Throat: Uvula is midline, oropharynx is clear and moist and mucous membranes are normal. Mucous membranes are not pale and not cyanotic. No oropharyngeal exudate, posterior oropharyngeal edema, posterior oropharyngeal erythema or tonsillar abscesses.  Eyes: Conjunctivae are normal. Pupils are equal, round, and reactive to light. No scleral icterus.  Neck: Normal range of motion and full passive range of motion without pain. Neck supple.  Cardiovascular: Normal rate, regular rhythm and intact distal pulses.  Pulmonary/Chest: Effort normal. No stridor. No respiratory distress. She has decreased breath sounds. She has wheezes.  Diminished breath sounds throughout with mild expiratory wheeze  Abdominal: Soft. Bowel sounds are normal. She exhibits no mass. There is no tenderness. There is no rebound and no guarding.  Musculoskeletal: Normal range of motion. She exhibits no edema.  Lymphadenopathy:    She has no cervical adenopathy.  Neurological: She is alert.  Speech is clear and goal oriented Moves extremities without ataxia  Skin: Skin is warm and dry. No rash noted. She is not diaphoretic.  Psychiatric: She has a normal mood and affect.  Nursing note and vitals reviewed.     ED Treatments / Results  Labs (all labs ordered are listed, but only abnormal results are displayed) Labs Reviewed  CBC WITH  DIFFERENTIAL/PLATELET - Abnormal; Notable for the following:       Result Value   Hemoglobin 11.4 (*)    HCT 35.4 (*)    MCV 70.7 (*)    MCH 22.8 (*)    RDW 15.8 (*)    All other components within normal limits  COMPREHENSIVE METABOLIC PANEL - Abnormal; Notable for the following:    Glucose, Bld 107 (*)    Albumin 3.4 (*)    AST 14 (*)    ALT 12 (*)    Total Bilirubin 0.2 (*)    All other components within normal limits    EKG  EKG Interpretation None       Radiology Dg Chest 2 View  Result Date: 08/06/2016 CLINICAL DATA:  Cough and chills.  Fever. EXAM: CHEST  2 VIEW COMPARISON:  None. FINDINGS: Trace atelectasis left lung base. Otherwise clear lungs. Cardiopericardial silhouette is at upper limits of normal for size. The visualized bony structures of the thorax are intact. IMPRESSION: Left basilar atelectasis. Electronically Signed   By: Misty Stanley M.D.   On: 08/06/2016 21:42    Procedures Procedures (including critical care time)  Medications Ordered in ED Medications  albuterol (PROVENTIL HFA;VENTOLIN HFA) 108 (90 Base) MCG/ACT inhaler 2 puff (2 puffs Inhalation Given 08/07/16 0400)  albuterol (PROVENTIL) (2.5 MG/3ML) 0.083% nebulizer solution 5 mg (5 mg Nebulization Given 08/07/16 0258)  ipratropium (ATROVENT) nebulizer solution 0.5 mg (0.5 mg Nebulization Given 08/07/16 0258)  acetaminophen (TYLENOL) tablet 1,000 mg (1,000 mg Oral Given 08/07/16 0257)  aerochamber plus with mask device 1 each (1 each Other Given 08/07/16 0400)     Initial Impression / Assessment and Plan / ED Course  I have reviewed the triage vital signs and the nursing notes.  Pertinent labs & imaging results that were available during my care of the patient were reviewed by me and considered in my medical decision making (see chart for details).  Clinical Course as of Aug 07 632  Wed Aug 07, 2016  0327 Mild, history of same Hemoglobin: (!) 11.4 [HM]  0327 Hypertensive and tachycardic on  initial arrival. Low-grade fever 99.5 oral however I suspect likely higher due to patient breathing through her mouth. BP: (!) 186/110 [HM]    Clinical Course User Index [HM] Abigail Butts, PA-C    MDM  Patient with symptoms consistent with influenza.  Vitals are stable, low-grade fever.  No signs of dehydration, tolerating PO's.  Lungs with nonfocal wheezing and chest x-ray without evidence of pneumonia. Mild atelectasis noted. The patient understands that symptoms are greater than the recommended 24-48 hour window of treatment.  She is not high risk. Significant improvement in lung sounds after  albuterol. Patient reports she feels some better. Patient will be discharged with instructions to orally hydrate, rest, and use over-the-counter medications such as anti-inflammatories ibuprofen and Aleve for muscle aches and Tylenol for fever.  Patient will also be given a cough suppressant.   Final Clinical Impressions(s) / ED Diagnoses   Final diagnoses:  Influenza-like illness  Cough    New Prescriptions Discharge Medication List as of 08/07/2016  3:31 AM    START taking these medications   Details  benzonatate (TESSALON) 100 MG capsule Take 1 capsule (100 mg total) by mouth every 8 (eight) hours., Starting Wed 08/07/2016, Print    fluticasone (FLONASE) 50 MCG/ACT nasal spray Place 2 sprays into both nostrils daily., Starting Wed 08/07/2016, [employer] Charlayne Vultaggio, PA-C [id number]    David Glick, MD [id number]

## 2016-08-07 NOTE — Discharge Instructions (Signed)
1. Medications: flonase, mucinex, tessalon, albuterol, usual home medications °2. Treatment: rest, drink plenty of fluids, take tylenol or ibuprofen for fever control °3. Follow Up: Please followup with your primary doctor in 3 days for discussion of your diagnoses and further evaluation after today's visit; if you do not have a primary care doctor use the resource guide provided to find one; Return to the ER for high fevers, difficulty breathing or other concerning symptoms ° °

## 2016-08-16 ENCOUNTER — Ambulatory Visit: Payer: BLUE CROSS/BLUE SHIELD | Admitting: Family Medicine

## 2016-08-19 ENCOUNTER — Encounter: Payer: BLUE CROSS/BLUE SHIELD | Admitting: Gastroenterology

## 2016-08-26 ENCOUNTER — Ambulatory Visit: Payer: BLUE CROSS/BLUE SHIELD | Admitting: Family Medicine

## 2016-09-02 ENCOUNTER — Ambulatory Visit: Payer: BLUE CROSS/BLUE SHIELD | Attending: Family Medicine | Admitting: Family Medicine

## 2016-09-02 ENCOUNTER — Encounter: Payer: Self-pay | Admitting: Family Medicine

## 2016-09-02 VITALS — BP 158/97 | HR 86 | Temp 98.3°F | Ht 65.0 in | Wt 282.6 lb

## 2016-09-02 DIAGNOSIS — I10 Essential (primary) hypertension: Secondary | ICD-10-CM | POA: Diagnosis not present

## 2016-09-02 DIAGNOSIS — F411 Generalized anxiety disorder: Secondary | ICD-10-CM

## 2016-09-02 MED ORDER — LISINOPRIL 40 MG PO TABS: 40.0000 mg | ORAL_TABLET | Freq: Every day | ORAL | 3 refills | Status: DC

## 2016-09-02 MED ORDER — CHLORTHALIDONE 25 MG PO TABS: 25.0000 mg | ORAL_TABLET | Freq: Every day | ORAL | 3 refills | Status: DC

## 2016-09-02 MED ORDER — METOPROLOL TARTRATE 50 MG PO TABS: 50.0000 mg | ORAL_TABLET | Freq: Two times a day (BID) | ORAL | 3 refills | Status: DC

## 2016-09-02 MED ORDER — HYDROXYZINE HCL 50 MG PO TABS: 25.0000 mg | ORAL_TABLET | Freq: Three times a day (TID) | ORAL | 2 refills | Status: DC | PRN

## 2016-09-02 MED ORDER — ESCITALOPRAM OXALATE 20 MG PO TABS: 20.0000 mg | ORAL_TABLET | Freq: Every day | ORAL | 2 refills | Status: DC

## 2016-09-02 NOTE — Progress Notes (Addendum)
Subjective:  Patient ID: Theresa Pearson, female    DOB: 1966-03-16  Age: 51 y.o. MRN: [id number]  CC: Hypertension   HPI Theresa Pearson presents for    1. CHRONIC HYPERTENSION  Disease Monitoring  Blood pressure range: not checking   Chest pain: no   Dyspnea: no   Claudication: no   Medication compliance: yes, took 50 mg of chlorthalidone last night.  Medication Side Effects  Lightheadedness: no   Urinary frequency: no   Edema: no   Social History   Social History Narrative  . No narrative on file    Outpatient Medications Prior to Visit  Medication Sig Dispense Refill  . albuterol (PROVENTIL HFA;VENTOLIN HFA) 108 (90 BASE) MCG/ACT inhaler Inhale 2 puffs into the lungs every 6 (six) hours as needed for wheezing or shortness of breath. 3 Inhaler 3  . benzonatate (TESSALON) 100 MG capsule Take 1 capsule (100 mg total) by mouth every 8 (eight) hours. 21 capsule 0  . dicyclomine (BENTYL) 10 MG capsule Take 1-2 capsules (10-20 mg total) by mouth every 8 (eight) hours as needed for spasms. 90 capsule 3  . fluticasone (FLONASE) 50 MCG/ACT nasal spray Place 2 sprays into both nostrils daily. 9.9 g 2  . lisinopril (PRINIVIL,ZESTRIL) 40 MG tablet Take 1 tablet (40 mg total) by mouth daily. 30 tablet 11  . metoprolol (LOPRESSOR) 50 MG tablet Take 1 tablet (50 mg total) by mouth 2 (two) times daily. 60 tablet 11  . Multiple Vitamins-Minerals (MULTIVITAMIN PO) Take 1 tablet by mouth daily.    Marland Kitchen omeprazole (PRILOSEC) 20 MG capsule Take 1 capsule (20 mg total) by mouth daily. (Patient taking differently: Take 20 mg by mouth daily as needed (acid reflux). ) 30 capsule 3  . spironolactone (ALDACTONE) 25 MG tablet Take 1 tablet (25 mg total) by mouth daily. 30 tablet 2  . escitalopram (LEXAPRO) 20 MG tablet TAKE 1 TABLET BY MOUTH DAILY (Patient not taking: Reported on 07/01/2016) 30 tablet 3  . hydrOXYzine (ATARAX/VISTARIL) 50 MG tablet Take 0.5-1 tablets (25-50 mg total) by mouth 3 (three)  times daily as needed. (Patient not taking: Reported on 09/02/2016) 30 tablet 0  . naproxen (NAPROSYN) 500 MG tablet Take twice a day for seven days then as needed (Patient not taking: Reported on 09/02/2016) 60 tablet 1   No facility-administered medications prior to visit.     ROS Review of Systems  Constitutional: Negative for chills and fever.  Eyes: Negative for visual disturbance.  Respiratory: Negative for shortness of breath.   Cardiovascular: Positive for leg swelling. Negative for chest pain.  Gastrointestinal: Negative for abdominal distention, abdominal pain, anal bleeding, blood in stool, constipation, diarrhea, nausea and rectal pain.  Musculoskeletal: Negative for arthralgias and back pain.  Skin: Negative for rash.  Allergic/Immunologic: Negative for immunocompromised state.  Hematological: Negative for adenopathy. Does not bruise/bleed easily.  Psychiatric/Behavioral: Negative for dysphoric mood and suicidal ideas.    Objective:  BP (!) 158/97 (BP Location: Left Arm, Patient Position: Sitting, Cuff Size: Large)   Pulse 86   Temp 98.3 F (36.8 C) (Oral)   Ht 5\' 5"  (1.651 m)   Wt 282 lb 9.6 oz (128.2 kg)   SpO2 97%   BMI 47.03 kg/m   BP/Weight 09/02/2016 08/07/2016 [id number]  Systolic BP 0000000 [id number]  Diastolic BP 97 84 96  Wt. (Lbs) 282.6 - 285  BMI 47.03 - 47.43    Physical Exam  Constitutional: She is oriented to person, place, and time. She  appears well-developed and well-nourished. No distress.  HENT:  Head: Normocephalic and atraumatic.  Cardiovascular: Normal rate, regular rhythm, normal heart sounds and intact distal pulses.   Pulmonary/Chest: Effort normal and breath sounds normal.  Abdominal: Soft. Bowel sounds are normal. She exhibits no distension and no mass. There is no tenderness. There is no rebound and no guarding.  Genitourinary: Rectal exam shows guaiac negative stool.  Musculoskeletal: She exhibits no edema.  Neurological: She is alert and  oriented to person, place, and time.  Skin: Skin is warm and dry. No rash noted.  Psychiatric: She has a normal mood and affect.   Depression screen [facility] 2/9 09/02/2016 06/21/2016 06/11/2016  Decreased Interest 0 0 0  Down, Depressed, Hopeless 0 0 0  PHQ - 2 Score 0 0 0  Altered sleeping 0 1 -  Tired, decreased energy 1 1 -  Change in appetite 0 2 -  Feeling bad or failure about yourself  0 0 -  Trouble concentrating 0 0 -  Moving slowly or fidgety/restless 0 0 -  Suicidal thoughts 0 0 -  PHQ-9 Score 1 4 -  Some recent data might be hidden   GAD 7 : Generalized Anxiety Score 09/02/2016 06/21/2016 03/26/2016 10/16/2015  Nervous, Anxious, on Edge 2 2 1 1   Control/stop worrying 1 0 0 0  Worry too much - different things 0 0 0 0  Trouble relaxing 0 1 0 1  Restless 0 0 0 0  Easily annoyed or irritable 0 2 0 1  Afraid - awful might happen 0 0 0 1  Total GAD 7 Score 3 5 1 4      Assessment & Plan:  Salmah was seen today for hypertension.  Diagnoses and all orders for this visit:  Essential hypertension -     Discontinue: chlorthalidone (HYGROTON) 25 MG tablet; Take 1 tablet (25 mg total) by mouth daily. -     lisinopril (PRINIVIL,ZESTRIL) 40 MG tablet; Take 1 tablet (40 mg total) by mouth daily. -     metoprolol (LOPRESSOR) 50 MG tablet; Take 1 tablet (50 mg total) by mouth 2 (two) times daily. -     chlorthalidone (HYGROTON) 25 MG tablet; Take 1 tablet (25 mg total) by mouth daily.  GAD (generalized anxiety disorder) -     escitalopram (LEXAPRO) 20 MG tablet; Take 1 tablet (20 mg total) by mouth daily. -     hydrOXYzine (ATARAX/VISTARIL) 50 MG tablet; Take 0.5-1 tablets (25-50 mg total) by mouth 3 (three) times daily as needed.   There are no diagnoses linked to this encounter.  No orders of the defined types were placed in this encounter.   Follow-up: Return in about 4 weeks (around 09/30/2016) for HTN .   Boykin Nearing MD

## 2016-09-02 NOTE — Patient Instructions (Addendum)
Theresa Pearson was seen today for hypertension.  Diagnoses and all orders for this visit:  Essential hypertension -     Discontinue: chlorthalidone (HYGROTON) 25 MG tablet; Take 1 tablet (25 mg total) by mouth daily. -     lisinopril (PRINIVIL,ZESTRIL) 40 MG tablet; Take 1 tablet (40 mg total) by mouth daily. -     metoprolol (LOPRESSOR) 50 MG tablet; Take 1 tablet (50 mg total) by mouth 2 (two) times daily. -     chlorthalidone (HYGROTON) 25 MG tablet; Take 1 tablet (25 mg total) by mouth daily.  GAD (generalized anxiety disorder) -     escitalopram (LEXAPRO) 20 MG tablet; Take 1 tablet (20 mg total) by mouth daily. -     hydrOXYzine (ATARAX/VISTARIL) 50 MG tablet; Take 0.5-1 tablets (25-50 mg total) by mouth 3 (three) times daily as needed.   Stop taking spirolactone  F/u in 4 weeks for HTN   Dr. Adrian Blackwater   Potassium Content of Foods Potassium is a mineral found in many foods and drinks. It helps keep fluids and minerals balanced in your body and affects how steadily your heart beats. Potassium also helps control your blood pressure and keep your muscles and nervous system healthy. Certain health conditions and medicines may change the balance of potassium in your body. When this happens, you can help balance your level of potassium through the foods that you do or do not eat. Your health care provider or dietitian may recommend an amount of potassium that you should have each day. The following lists of foods provide the amount of potassium (in parentheses) per serving in each item. High in potassium The following foods and beverages have 200 mg or more of potassium per serving:  Apricots, 2 raw or 5 dry (200 mg).  Artichoke, 1 medium (345 mg).  Avocado, raw,  each (245 mg).  Banana, 1 medium (425 mg).  Beans, lima, or baked beans, canned,  cup (280 mg).  Beans, white, canned,  cup (595 mg).  Beef roast, 3 oz (320 mg).  Beef, ground, 3 oz (270 mg).  Beets, raw or cooked,   cup (260 mg).  Bran muffin, 2 oz (300 mg).  Broccoli,  cup (230 mg).  Brussels sprouts,  cup (250 mg).  Cantaloupe,  cup (215 mg).  Cereal, 100% bran,  cup (200-400 mg).  Cheeseburger, single, fast food, 1 each (225-400 mg).  Chicken, 3 oz (220 mg).  Clams, canned, 3 oz (535 mg).  Crab, 3 oz (225 mg).  Dates, 5 each (270 mg).  Dried beans and peas,  cup (300-475 mg).  Figs, dried, 2 each (260 mg).  Fish: halibut, tuna, cod, snapper, 3 oz (480 mg).  Fish: salmon, haddock, swordfish, perch, 3 oz (300 mg).  Fish, tuna, canned 3 oz (200 mg).  [place] fries, fast food, 3 oz (470 mg).  Granola with fruit and nuts,  cup (200 mg).  Grapefruit juice,  cup (200 mg).  Greens, beet,  cup (655 mg).  Honeydew melon,  cup (200 mg).  Kale, raw, 1 cup (300 mg).  Kiwi, 1 medium (240 mg).  Kohlrabi, rutabaga, parsnips,  cup (280 mg).  Lentils,  cup (365 mg).  Mango, 1 each (325 mg).  Milk, chocolate, 1 cup (420 mg).  Milk: nonfat, low-fat, whole, buttermilk, 1 cup (350-380 mg).  Molasses, 1 Tbsp (295 mg).  Mushrooms,  cup (280) mg.  Nectarine, 1 each (275 mg).  Nuts: almonds, peanuts, hazelnuts, [place], cashew, mixed, 1 oz (200 mg).  Nuts, pistachios, 1 oz (295 mg).  Orange, 1 each (240 mg).  Orange juice,  cup (235 mg).  Papaya, medium,  fruit (390 mg).  Peanut butter, chunky, 2 Tbsp (240 mg).  Peanut butter, smooth, 2 Tbsp (210 mg).  Pear, 1 medium (200 mg).  Pomegranate, 1 whole (400 mg).  Pomegranate juice,  cup (215 mg).  Pork, 3 oz (350 mg).  Potato chips, salted, 1 oz (465 mg).  Potato, baked with skin, 1 medium (925 mg).  Potatoes, boiled,  cup (255 mg).  Potatoes, mashed,  cup (330 mg).  Prune juice,  cup (370 mg).  Prunes, 5 each (305 mg).  Pudding, chocolate,  cup (230 mg).  Pumpkin, canned,  cup (250 mg).  Raisins, seedless,  cup (270 mg).  Seeds, sunflower or pumpkin, 1 oz (240 mg).  Soy milk, 1 cup  (300 mg).  Spinach,  cup (420 mg).  Spinach, canned,  cup (370 mg).  Sweet potato, baked with skin, 1 medium (450 mg).  Swiss chard,  cup (480 mg).  Tomato or vegetable juice,  cup (275 mg).  Tomato sauce or puree,  cup (400-550 mg).  Tomato, raw, 1 medium (290 mg).  Tomatoes, canned,  cup (200-300 mg).  [place], 3 oz (250 mg).  Wheat germ, 1 oz (250 mg).  Winter squash,  cup (250 mg).  Yogurt, plain or fruited, 6 oz (260-435 mg).  Zucchini,  cup (220 mg). Moderate in potassium The following foods and beverages have 50-200 mg of potassium per serving:  Apple, 1 each (150 mg).  Apple juice,  cup (150 mg).  Applesauce,  cup (90 mg).  Apricot nectar,  cup (140 mg).  Asparagus, small spears,  cup or 6 spears (155 mg).  Bagel, cinnamon raisin, 1 each (130 mg).  Bagel, egg or plain, 4 in., 1 each (70 mg).  Beans, green,  cup (90 mg).  Beans, yellow,  cup (190 mg).  Beer, regular, 12 oz (100 mg).  Beets, canned,  cup (125 mg).  Blackberries,  cup (115 mg).  Blueberries,  cup (60 mg).  Bread, whole wheat, 1 slice (70 mg).  Broccoli, raw,  cup (145 mg).  Cabbage,  cup (150 mg).  Carrots, cooked or raw,  cup (180 mg).  Cauliflower, raw,  cup (150 mg).  Celery, raw,  cup (155 mg).  Cereal, bran flakes, cup (120-150 mg).  Cheese, cottage,  cup (110 mg).  Cherries, 10 each (150 mg).  Chocolate, 1 oz bar (165 mg).  Coffee, brewed 6 oz (90 mg).  Corn,  cup or 1 ear (195 mg).  Cucumbers,  cup (80 mg).  Egg, large, 1 each (60 mg).  Eggplant,  cup (60 mg).  Endive, raw, cup (80 mg).  English muffin, 1 each (65 mg).  Fish, orange roughy, 3 oz (150 mg).  Frankfurter, beef or pork, 1 each (75 mg).  Fruit cocktail,  cup (115 mg).  Grape juice,  cup (170 mg).  Grapefruit,  fruit (175 mg).  Grapes,  cup (155 mg).  Greens: kale, turnip, collard,  cup (110-150 mg).  Ice cream or frozen yogurt, chocolate,   cup (175 mg).  Ice cream or frozen yogurt, vanilla,  cup (120-150 mg).  Lemons, limes, 1 each (80 mg).  Lettuce, all types, 1 cup (100 mg).  Mixed vegetables,  cup (150 mg).  Mushrooms, raw,  cup (110 mg).  Nuts: walnuts, pecans, or macadamia, 1 oz (125 mg).  Oatmeal,  cup (80 mg).  Okra,  cup (  110 mg).  Onions, raw,  cup (120 mg).  Peach, 1 each (185 mg).  Peaches, canned,  cup (120 mg).  Pears, canned,  cup (120 mg).  Peas, green, frozen,  cup (90 mg).  Peppers, green,  cup (130 mg).  Peppers, red,  cup (160 mg).  Pineapple juice,  cup (165 mg).  Pineapple, fresh or canned,  cup (100 mg).  Plums, 1 each (105 mg).  Pudding, vanilla,  cup (150 mg).  Raspberries,  cup (90 mg).  Rhubarb,  cup (115 mg).  Rice, wild,  cup (80 mg).  Shrimp, 3 oz (155 mg).  Spinach, raw, 1 cup (170 mg).  Strawberries,  cup (125 mg).  Summer squash  cup (175-200 mg).  Swiss chard, raw, 1 cup (135 mg).  Tangerines, 1 each (140 mg).  Tea, brewed, 6 oz (65 mg).  Turnips,  cup (140 mg).  Watermelon,  cup (85 mg).  Wine, red, table, 5 oz (180 mg).  Wine, white, table, 5 oz (100 mg). Low in potassium The following foods and beverages have less than 50 mg of potassium per serving.  Bread, white, 1 slice (30 mg).  Carbonated beverages, 12 oz (less than 5 mg).  Cheese, 1 oz (20-30 mg).  Cranberries,  cup (45 mg).  Cranberry juice cocktail,  cup (20 mg).  Fats and oils, 1 Tbsp (less than 5 mg).  Hummus, 1 Tbsp (32 mg).  Nectar: papaya, mango, or pear,  cup (35 mg).  Rice, white or brown,  cup (50 mg).  Spaghetti or macaroni,  cup cooked (30 mg).  Tortilla, flour or corn, 1 each (50 mg).  Waffle, 4 in., 1 each (50 mg).  Water chestnuts,  cup (40 mg). This information is not intended to replace advice given to you by your health care provider. Make sure you discuss any questions you have with your health care provider. Document  Released: 02/12/2005 Document Revised: 12/07/2015 Document Reviewed: 05/28/2013 Elsevier Interactive Patient Education  2017 [employer].

## 2016-09-02 NOTE — Assessment & Plan Note (Signed)
Uncontrolled HTN Plan: Stop aldactone Back to chlorthalidone 25 mg daily Metoprolol 50 mg BID  Lisinopril 40 mg daily

## 2016-09-02 NOTE — Progress Notes (Signed)
Pt is here today to follow up on HTN. Pt states that new medication is not helping keep her BP down.

## 2016-09-30 ENCOUNTER — Ambulatory Visit: Payer: BLUE CROSS/BLUE SHIELD | Admitting: Family Medicine

## 2016-11-22 ENCOUNTER — Ambulatory Visit: Admitting: Obstetrics & Gynecology

## 2016-11-22 VITALS — BP 114/76 | Ht 64.0 in | Wt 124.0 lb

## 2016-11-22 DIAGNOSIS — Z01419 Encounter for gynecological examination (general) (routine) without abnormal findings: Secondary | ICD-10-CM

## 2016-11-22 DIAGNOSIS — Z1231 Encounter for screening mammogram for malignant neoplasm of breast: Secondary | ICD-10-CM

## 2016-11-22 DIAGNOSIS — N83209 Unspecified ovarian cyst, unspecified side: Secondary | ICD-10-CM

## 2016-11-22 LAB — POCT URINALYSIS DIPSTICK (5)
Glucose, UA POCT: NEGATIVE mg/dL
Protein, UA POCT: NEGATIVE mg/dL

## 2016-11-22 NOTE — Progress Notes (Signed)
Subjective:       Elizabeth Dougherty is a 51 y.o. female here for a routine exam.  Current complaints: none.  Personal health questionnaire reviewed: yes.  Pt reports no menses since last November.  Occasional hot flashes.  No d/c, dysuria, or dyspareunia.  Husband has been working in [place] but will be back next month Administrator, sports).   All3 kids are doing well (daughters are in 8th grade and 6th grade, and son is in 1st.  Pt has a h/o a left paraovarian cyst.    Gynecologic History  No LMP recorded. Patient is not currently having periods (Reason: Menopausal).  Contraception: vasectomy  Breast self exam: discussed  Common GYN tests  Last Pap Date: 11/21/15  Last Pap Result: Normal  Last Mammo Date: 04/16/16  Last Mammo Result: Normal      The following portions of the patient's history were reviewed and updated as appropriate: allergies, current medications, past family history, past medical history, past social history, past surgical history and problem list.      Review of Systems  Pertinent items are noted in HPI.      Objective:      BP 114/76   Ht 1.626 m (5\' 4" )   Wt 56.2 kg (124 lb)   BMI 21.28 kg/m   General appearance: alert, appears stated age and cooperative  Neck: no adenopathy, supple, symmetrical, trachea midline and thyroid not enlarged, symmetric, no tenderness/mass/nodules  Breasts: normal appearance, no masses or tenderness, No nipple retraction or dimpling, No nipple discharge or bleeding, No axillary or supraclavicular adenopathy  Abdomen: normal findings: no masses palpable, no organomegaly and soft, non-tender  Pelvic exam:     Urinary system: urethral meatus normal   External genitalia: normal general appearance   Vaginal: normal rugae   Cervix: normal appearance   Adnexa: normal bimanual exam   Uterus: normal single, nontender   Rectal: good sphincter tone and no masses          Assessment:      Healthy female exam.        Plan:      Mammogram ordered.  Follow up in: 1 year.  Thin prep  pap/HPV deferred secondary to nl Pap in 2017 with negative HR HPV  Pelvic sono ordered to f/u left paraovarian cyst.

## 2016-11-27 ENCOUNTER — Encounter: Payer: Self-pay | Admitting: Family Medicine

## 2017-02-24 ENCOUNTER — Telehealth: Payer: Self-pay | Admitting: Obstetrics & Gynecology

## 2017-02-24 NOTE — Telephone Encounter (Signed)
Pt called. She cannot find her mammogram and pelvic ultrasound order.  Order mailed to pt.  Advised pt not to have her mammogram before October 3rd.  It should be a full year.

## 2017-04-21 ENCOUNTER — Ambulatory Visit (INDEPENDENT_AMBULATORY_CARE_PROVIDER_SITE_OTHER): Payer: Self-pay | Admitting: Sports Medicine

## 2017-04-21 DIAGNOSIS — M722 Plantar fascial fibromatosis: Secondary | ICD-10-CM

## 2017-04-21 MED ORDER — DICLOFENAC SODIUM 1 % TD GEL: 2.0000 g | Freq: Four times a day (QID) | TRANSDERMAL | 0 refills | Status: AC

## 2017-04-21 NOTE — Progress Notes (Signed)
   Subjective:    Patient ID: Theresa Pearson, female    DOB: September 15, 1965, 51 y.o.   MRN: [id number]  HPI chief complaint: Right heel pain  Theresa Pearson comes in today complaining of 2 weeks of plantar right heel pain. She denies any trauma. Pain is worse with first standing as well as at the end of the day. She localizes it to the calcaneal origin of her plantar fascia. She has had similar issues in the past. No swelling. No numbness or tingling. No treatment to date. She is leaving for a trip to the [place] later this week.  Interim medical history reviewed Medications reviewed Allergies reviewed     Review of Systems    as above Objective:   Physical Exam  Well-developed, obese. No acute distress.  Right heel: There is tenderness to palpation at the calcaneal origin of the plantar fascial. Negative calcaneal squeeze. Pronation with walking. Neurovascularly intact distally.  MSK ultrasound of the right heel shows a slightly thickened plantar fascia. There is edema throughout the plantar fascia as well.       Assessment & Plan:   Plantar fasciitis, right heel  Green sports insoles with scaphoid pads. Plantar fascial stretches daily. Daily icing as well. She is provided with a Cam Walker to take with her on her trip overseas just in case her symptoms are severe enough that she needs to completely rest her foot and ankle. Follow-up for going or recalcitrant issues.

## 2017-05-05 ENCOUNTER — Encounter: Payer: Self-pay | Admitting: Obstetrics & Gynecology

## 2017-05-06 NOTE — Progress Notes (Signed)
Please call.  Left paraovarian cyst is stable.

## 2017-05-07 ENCOUNTER — Telehealth: Payer: Self-pay

## 2017-05-07 NOTE — Telephone Encounter (Signed)
-----   Message from Madelynn Done, MD sent at 05/06/2017  9:18 AM EDT -----  Please call.  Left paraovarian cyst is stable.

## 2017-05-07 NOTE — Telephone Encounter (Signed)
Pt aware of results 

## 2017-06-04 ENCOUNTER — Encounter: Payer: Self-pay | Admitting: Obstetrics & Gynecology

## 2017-08-06 ENCOUNTER — Ambulatory Visit: Payer: Self-pay | Attending: Internal Medicine | Admitting: Physician Assistant

## 2017-08-06 VITALS — BP 143/88 | HR 65 | Temp 97.7°F | Resp 16 | Wt 279.8 lb

## 2017-08-06 DIAGNOSIS — I10 Essential (primary) hypertension: Secondary | ICD-10-CM | POA: Insufficient documentation

## 2017-08-06 DIAGNOSIS — F411 Generalized anxiety disorder: Secondary | ICD-10-CM | POA: Insufficient documentation

## 2017-08-06 DIAGNOSIS — J45909 Unspecified asthma, uncomplicated: Secondary | ICD-10-CM | POA: Insufficient documentation

## 2017-08-06 DIAGNOSIS — K219 Gastro-esophageal reflux disease without esophagitis: Secondary | ICD-10-CM | POA: Insufficient documentation

## 2017-08-06 DIAGNOSIS — Z79899 Other long term (current) drug therapy: Secondary | ICD-10-CM | POA: Insufficient documentation

## 2017-08-06 DIAGNOSIS — Z86718 Personal history of other venous thrombosis and embolism: Secondary | ICD-10-CM | POA: Insufficient documentation

## 2017-08-06 DIAGNOSIS — M1990 Unspecified osteoarthritis, unspecified site: Secondary | ICD-10-CM | POA: Insufficient documentation

## 2017-08-06 DIAGNOSIS — F329 Major depressive disorder, single episode, unspecified: Secondary | ICD-10-CM | POA: Insufficient documentation

## 2017-08-06 DIAGNOSIS — Z8709 Personal history of other diseases of the respiratory system: Secondary | ICD-10-CM

## 2017-08-06 DIAGNOSIS — M797 Fibromyalgia: Secondary | ICD-10-CM | POA: Insufficient documentation

## 2017-08-06 DIAGNOSIS — G4730 Sleep apnea, unspecified: Secondary | ICD-10-CM | POA: Insufficient documentation

## 2017-08-06 MED ORDER — ALBUTEROL SULFATE HFA 108 (90 BASE) MCG/ACT IN AERS: 2.0000 | INHALATION_SPRAY | Freq: Four times a day (QID) | RESPIRATORY_TRACT | 3 refills | Status: DC | PRN

## 2017-08-06 MED ORDER — POTASSIUM CHLORIDE CRYS ER 20 MEQ PO TBCR: 20.0000 meq | EXTENDED_RELEASE_TABLET | Freq: Two times a day (BID) | ORAL | 3 refills | Status: DC

## 2017-08-06 MED ORDER — CHLORTHALIDONE 50 MG PO TABS: 50.0000 mg | ORAL_TABLET | Freq: Every day | ORAL | 2 refills | Status: DC

## 2017-08-06 MED ORDER — LISINOPRIL 40 MG PO TABS: 40.0000 mg | ORAL_TABLET | Freq: Every day | ORAL | 3 refills | Status: DC

## 2017-08-06 MED ORDER — METOPROLOL TARTRATE 50 MG PO TABS: 50.0000 mg | ORAL_TABLET | Freq: Two times a day (BID) | ORAL | 3 refills | Status: DC

## 2017-08-06 MED ORDER — ESCITALOPRAM OXALATE 20 MG PO TABS: 20.0000 mg | ORAL_TABLET | Freq: Every day | ORAL | 5 refills | Status: DC

## 2017-08-06 MED ORDER — OMEPRAZOLE 20 MG PO CPDR: 20.0000 mg | DELAYED_RELEASE_CAPSULE | Freq: Every day | ORAL | 3 refills | Status: DC

## 2017-08-06 NOTE — Patient Instructions (Signed)
Check BP 3 times/week and record and bring to next visit.

## 2017-08-06 NOTE — Progress Notes (Signed)
Patient ID: Theresa Pearson, female   DOB: 04-14-1966, 52 y.o.   MRN: [id number]   Azalya Galyon, is a 52 y.o. female  UMP:[id number]  QMG:[id number]  DOB - May 02, 1966  Subjective:  Chief Complaint and HPI: Theresa Pearson is a 52 y.o. female here today needing med RF.  She has been out of [place] con and taking OTC potassium.  Otherwise compliant with meds.  Works as a Administrator.  Trying to eat healthier and lose weight.  No problems or complaints today.  Needs RF.  No labs in about 1 year   ROS:   Constitutional:  No f/c, No night sweats, No unexplained weight loss. EENT:  No vision changes, No blurry vision, No hearing changes. No mouth, throat, or ear problems.  Respiratory: No cough, No SOB Cardiac: No CP, no palpitations GI:  No abd pain, No N/V/D. GU: No Urinary s/sx Musculoskeletal: No joint pain Neuro: No headache, no dizziness, no motor weakness.  Skin: No rash Endocrine:  No polydipsia. No polyuria.  Psych: Denies SI/HI  No problems updated.  ALLERGIES: Allergies  Allergen Reactions  . Penicillins Shortness Of Breath and Rash    PAST MEDICAL HISTORY: Past Medical History:  Diagnosis Date  . Anemia   . Anxiety   . Arthritis   . Asthma   . Chronic headaches   . Depression   . Diverticulosis   . Fibromyalgia   . Gallstones   . GERD (gastroesophageal reflux disease)   . Hypertension   . Pneumonia   . Right leg DVT ([place])   . Sleep apnea     MEDICATIONS AT HOME: Prior to Admission medications   Medication Sig Start Date End Date Taking? Authorizing Provider  albuterol (PROVENTIL HFA;VENTOLIN HFA) 108 (90 Base) MCG/ACT inhaler Inhale 2 puffs into the lungs every 6 (six) hours as needed for wheezing or shortness of breath. 08/06/17  Yes Commodore Bellew, Dionne Bucy, PA-C  diclofenac sodium (VOLTAREN) 1 % GEL Apply 2 g topically 4 (four) times daily. 04/21/17  Yes Draper, Christia Reading R, DO  escitalopram (LEXAPRO) 20 MG tablet Take 1 tablet (20 mg total) by mouth daily. 08/06/17   Yes Prathik Aman M, PA-C  lisinopril (PRINIVIL,ZESTRIL) 40 MG tablet Take 1 tablet (40 mg total) by mouth daily. 08/06/17  Yes Freeman Caldron M, PA-C  metoprolol tartrate (LOPRESSOR) 50 MG tablet Take 1 tablet (50 mg total) by mouth 2 (two) times daily. 08/06/17  Yes Argentina Donovan, PA-C  Multiple Vitamins-Minerals (MULTIVITAMIN PO) Take 1 tablet by mouth daily.   Yes [provider]  omeprazole (PRILOSEC) 20 MG capsule Take 1 capsule (20 mg total) by mouth daily. 08/06/17  Yes Meliton Samad M, PA-C  benzonatate (TESSALON) 100 MG capsule Take 1 capsule (100 mg total) by mouth every 8 (eight) hours. Patient not taking: Reported on 08/06/2017 08/07/16   Muthersbaugh, Jarrett Soho, PA-C  chlorthalidone (HYGROTON) 50 MG tablet Take 1 tablet (50 mg total) by mouth daily. 08/06/17   Argentina Donovan, PA-C  dicyclomine (BENTYL) 10 MG capsule Take 1-2 capsules (10-20 mg total) by mouth every 8 (eight) hours as needed for spasms. Patient not taking: Reported on 08/06/2017 07/01/16   Yetta Flock, MD  fluticasone [facility]) 50 MCG/ACT nasal spray Place 2 sprays into both nostrils daily. Patient not taking: Reported on 08/06/2017 08/07/16   Muthersbaugh, Jarrett Soho, PA-C  hydrOXYzine (ATARAX/VISTARIL) 50 MG tablet Take 0.5-1 tablets (25-50 mg total) by mouth 3 (three) times daily as needed. Patient not taking: Reported on 08/06/2017  09/02/16   Boykin Nearing, MD  naproxen (NAPROSYN) 500 MG tablet Take twice a day for seven days then as needed Patient not taking: Reported on 09/02/2016 01/22/16   Thurman Coyer, DO  potassium chloride SA (K-DUR,KLOR-CON) 20 MEQ tablet Take 1 tablet (20 mEq total) by mouth 2 (two) times daily. 08/06/17   Argentina Donovan, PA-C     Objective:  EXAM:   Vitals:   08/06/17 0947  BP: (!) 143/88  Pulse: 65  Resp: 16  Temp: 97.7 F (36.5 C)  TempSrc: Oral  SpO2: 99%  Weight: 279 lb 12.8 oz (126.9 kg)    General appearance : A&OX3. NAD. Non-toxic-appearing,  obese HEENT: Atraumatic and Normocephalic.  PERRLA. EOM intact.   Neck: supple, no JVD. No cervical lymphadenopathy. No thyromegaly Chest/Lungs:  Breathing-non-labored, Good air entry bilaterally, breath sounds normal without rales, rhonchi, or wheezing  CVS: S1 S2 regular, no murmurs, gallops, rubs  Extremities: Bilateral Lower Ext shows no edema, both legs are warm to touch with = pulse throughout Neurology:  CN II-XII grossly intact, Non focal.   Psych:  TP linear. J/I WNL. Normal speech. Appropriate eye contact and affect.  Skin:  No Rash  Data Review Lab Results  Component Value Date   HGBA1C 5.7 09/18/2015     Assessment & Plan   1. Essential hypertension suboptimal control.  Check BP OOO 3times/week and record and bring to next visit - metoprolol tartrate (LOPRESSOR) 50 MG tablet; Take 1 tablet (50 mg total) by mouth 2 (two) times daily.  Dispense: 180 tablet; Refill: 3 - lisinopril (PRINIVIL,ZESTRIL) 40 MG tablet; Take 1 tablet (40 mg total) by mouth daily.  Dispense: 90 tablet; Refill: 3 - Comprehensive metabolic panel - TSH - CBC with Differential/Platelet  2. GAD (generalized anxiety disorder) stable-continue - escitalopram (LEXAPRO) 20 MG tablet; Take 1 tablet (20 mg total) by mouth daily.  Dispense: 30 tablet; Refill: 5 - Vitamin D, 25-hydroxy  3. Gastroesophageal reflux disease, esophagitis presence not specified Stable-continue- - omeprazole (PRILOSEC) 20 MG capsule; Take 1 capsule (20 mg total) by mouth daily.  Dispense: 30 capsule; Refill: 3  4. History of asthma Stable-  - albuterol (PROVENTIL HFA;VENTOLIN HFA) 108 (90 Base) MCG/ACT inhaler; Inhale 2 puffs into the lungs every 6 (six) hours as needed for wheezing or shortness of breath.  Dispense: 3 Inhaler; Refill: 3  Congratulated her on working on weight loss.    Patient have been counseled extensively about nutrition and exercise  Return for keep appt with [place] in 1 month.  .  The patient was  given clear instructions to go to ER or return to medical center if symptoms don't improve, worsen or new problems develop. The patient verbalized understanding. The patient was told to call to get lab results if they haven't heard anything in the next week.     Freeman Caldron, PA-C [facility] [place], [place]   08/06/2017, 10:04 AM

## 2017-08-07 LAB — COMPREHENSIVE METABOLIC PANEL
ALBUMIN: 4 g/dL (ref 3.5–5.5)
ALT: 13 IU/L (ref 0–32)
AST: 13 IU/L (ref 0–40)
Albumin/Globulin Ratio: 1.1 — ABNORMAL LOW (ref 1.2–2.2)
Alkaline Phosphatase: 88 IU/L (ref 39–117)
BUN / CREAT RATIO: 21 (ref 9–23)
BUN: 16 mg/dL (ref 6–24)
CHLORIDE: 98 mmol/L (ref 96–106)
CO2: 26 mmol/L (ref 20–29)
Calcium: 9.8 mg/dL (ref 8.7–10.2)
Creatinine, Ser: 0.78 mg/dL (ref 0.57–1.00)
GFR calc non Af Amer: 89 mL/min/{1.73_m2} (ref >59)
GFR, EST AFRICAN AMERICAN: 103 mL/min/{1.73_m2} (ref >59)
Globulin, Total: 3.5 g/dL (ref 1.5–4.5)
Glucose: 84 mg/dL (ref 65–99)
POTASSIUM: 3.9 mmol/L (ref 3.5–5.2)
Sodium: 143 mmol/L (ref 134–144)
TOTAL PROTEIN: 7.5 g/dL (ref 6.0–8.5)

## 2017-08-07 LAB — CBC WITH DIFFERENTIAL/PLATELET
BASOS: 0 %
Basophils Absolute: 0 10*3/uL (ref 0.0–0.2)
EOS (ABSOLUTE): 0.1 10*3/uL (ref 0.0–0.4)
EOS: 1 %
HEMOGLOBIN: 12 g/dL (ref 11.1–15.9)
Hematocrit: 37.5 % (ref 34.0–46.6)
Immature Grans (Abs): 0 10*3/uL (ref 0.0–0.1)
Immature Granulocytes: 0 %
LYMPHS ABS: 2.8 10*3/uL (ref 0.7–3.1)
Lymphs: 27 %
MCH: 22.7 pg — AB (ref 26.6–33.0)
MCHC: 32 g/dL (ref 31.5–35.7)
MCV: 71 fL — ABNORMAL LOW (ref 79–97)
MONOS ABS: 0.4 10*3/uL (ref 0.1–0.9)
Monocytes: 4 %
Neutrophils Absolute: 7.1 10*3/uL — ABNORMAL HIGH (ref 1.4–7.0)
Neutrophils: 68 %
PLATELETS: 444 10*3/uL — AB (ref 150–379)
RBC: 5.29 x10E6/uL — ABNORMAL HIGH (ref 3.77–5.28)
RDW: 17.3 % — ABNORMAL HIGH (ref 12.3–15.4)
WBC: 10.5 10*3/uL (ref 3.4–10.8)

## 2017-08-07 LAB — VITAMIN D 25 HYDROXY (VIT D DEFICIENCY, FRACTURES): Vit D, 25-Hydroxy: 27.4 ng/mL — ABNORMAL LOW (ref 30.0–100.0)

## 2017-08-07 LAB — TSH: TSH: 2.42 u[IU]/mL (ref 0.450–4.500)

## 2017-08-11 ENCOUNTER — Telehealth: Payer: Self-pay

## 2017-08-11 NOTE — Telephone Encounter (Signed)
Contacted pt to go over lab results pt is aware of results and doesn't have any questions or concerns 

## 2017-09-05 ENCOUNTER — Ambulatory Visit: Payer: Self-pay | Admitting: Family Medicine

## 2017-09-29 ENCOUNTER — Ambulatory Visit: Payer: Self-pay | Admitting: Nurse Practitioner

## 2017-10-29 ENCOUNTER — Other Ambulatory Visit (INDEPENDENT_AMBULATORY_CARE_PROVIDER_SITE_OTHER): Payer: Self-pay | Admitting: Family Medicine

## 2017-11-25 ENCOUNTER — Encounter: Payer: Self-pay | Admitting: Obstetrics & Gynecology

## 2017-11-25 ENCOUNTER — Ambulatory Visit: Admitting: Obstetrics & Gynecology

## 2017-11-25 VITALS — BP 110/72 | Ht 64.0 in | Wt 127.0 lb

## 2017-11-25 DIAGNOSIS — N83202 Unspecified ovarian cyst, left side: Secondary | ICD-10-CM

## 2017-11-25 DIAGNOSIS — Z1212 Encounter for screening for malignant neoplasm of rectum: Secondary | ICD-10-CM

## 2017-11-25 DIAGNOSIS — Z1231 Encounter for screening mammogram for malignant neoplasm of breast: Secondary | ICD-10-CM

## 2017-11-25 DIAGNOSIS — Z01419 Encounter for gynecological examination (general) (routine) without abnormal findings: Secondary | ICD-10-CM

## 2017-11-25 LAB — POCT UA URISTIX
Glucose, UA POCT: NEGATIVE mg/dL
POCT Protein, UA: NEGATIVE mg/dL

## 2017-11-25 NOTE — Progress Notes (Signed)
Subjective:       Elizabeth Dougherty is a 52 y.o. female here for a routine exam.  Current complaints: none.  Personal health questionnaire reviewed: yes.  Pt reports no menses for 13 months, then had a cycle when her teenage daughter started her period.  No d/c, dysuria, or dyspareunia.  Nl bM's.  No incontinence.  Oldest daughter is a Printmaker in [employer] and dancing, younger daughter is in 45th, and son is in 2nd.     Gynecologic History  Patient's last menstrual period was 11/12/2017.  Contraception: vasectomy  Breast self exam: discussed  Common GYN tests  Last Pap Date: 11/21/15  Last Pap Result: Normal  Last Mammo Date: 04/16/16  Last Mammo Result: Normal      The following portions of the patient's history were reviewed and updated as appropriate: allergies, current medications, past family history, past medical history, past social history, past surgical history and problem list.      Review of Systems  Pertinent items are noted in HPI.      Objective:      BP 110/72   Ht 1.626 m (5\' 4" )   Wt 57.6 kg (127 lb)   LMP 11/12/2017   BMI 21.80 kg/m   General appearance: alert, appears stated age and cooperative  Neck: no adenopathy, supple, symmetrical, trachea midline and thyroid not enlarged, symmetric, no tenderness/mass/nodules  Breasts: normal appearance, no masses or tenderness, No nipple retraction or dimpling, No nipple discharge or bleeding, No axillary or supraclavicular adenopathy  Abdomen: normal findings: no masses palpable, no organomegaly and soft, non-tender  Pelvic exam:     Urinary system: urethral meatus normal   External genitalia: normal general appearance   Vaginal: normal rugae   Cervix: normal appearance   Adnexa: normal bimanual exam   Uterus: normal single, nontender   Rectal: good sphincter tone and no masses          Assessment:      Healthy female exam.        Plan:      Mammogram ordered.  Follow up in: 1 year.  Thin prep pap/HPV deferred secondary to nl Pap in 2017 with negative HR  HPV  Pelvic sono to f/u left paraovarian cyst.

## 2018-01-01 ENCOUNTER — Other Ambulatory Visit: Payer: Self-pay

## 2018-01-01 DIAGNOSIS — I10 Essential (primary) hypertension: Secondary | ICD-10-CM

## 2018-01-01 MED ORDER — LISINOPRIL 40 MG PO TABS: 40.0000 mg | ORAL_TABLET | Freq: Every day | ORAL | 3 refills | Status: DC

## 2018-01-29 IMAGING — CR DG KNEE COMPLETE 4+V*L*
1 series · 1 of 1 positions shown · non-contrast
Comparison: None

CLINICAL DATA: BILATERAL chronic knee pain for 1 year RIGHT greater
than LEFT, no known injury

EXAM:
LEFT KNEE - COMPLETE 4+ VIEW

[view not recorded]
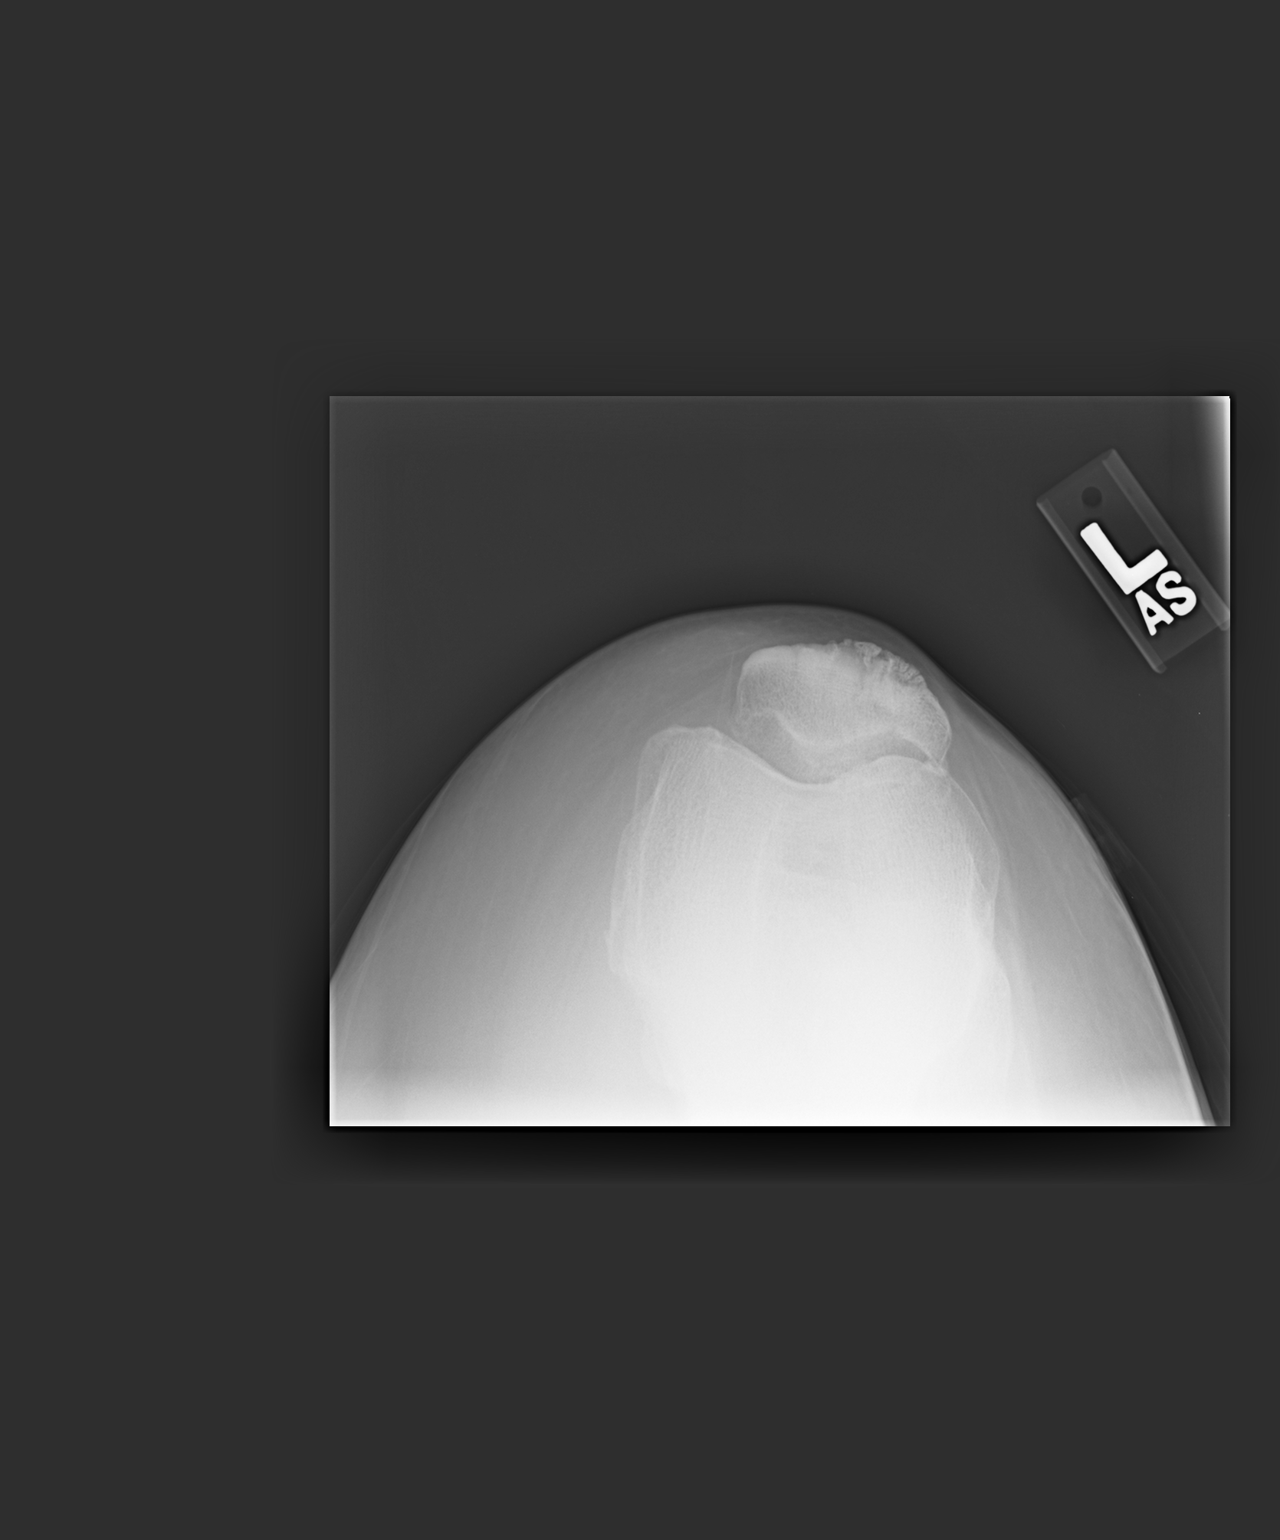

[1 of 1 positions shown; findings below may reference images not displayed]

FINDINGS: Mild degenerative changes with joint space narrowing and minimal
spur formation.

Small patellar spur at quadriceps tendon insertion.

No acute fracture, dislocation or bone destruction.

No knee joint effusion.
IMPRESSION: Mild degenerative changes LEFT knee without acute abnormality.

## 2018-01-29 IMAGING — CR DG KNEE COMPLETE 4+V*R*
3 series · 3 of 3 positions shown · non-contrast
Comparison: None

CLINICAL DATA: BILATERAL chronic knee pain RIGHT greater than LEFT
for 1 year, no injury

EXAM:
RIGHT KNEE - COMPLETE 4+ VIEW

[w knee ap right]
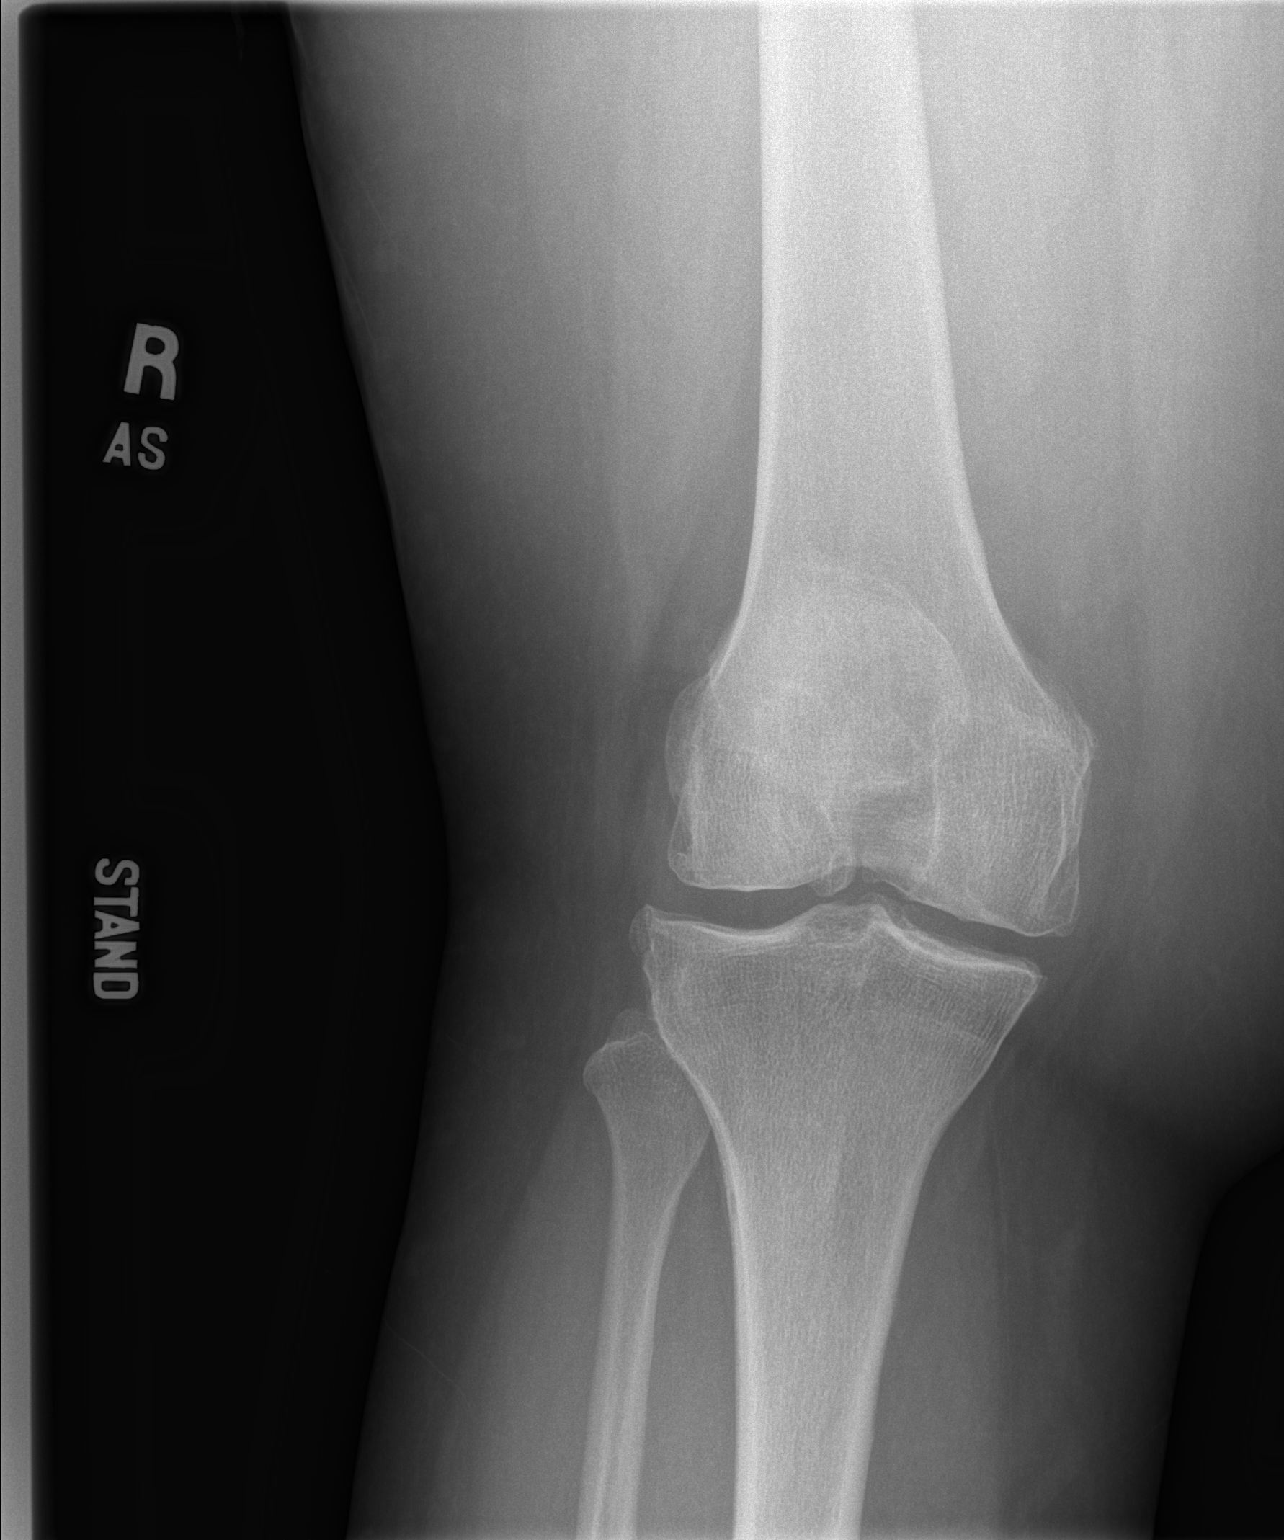

[w knee obl. right]
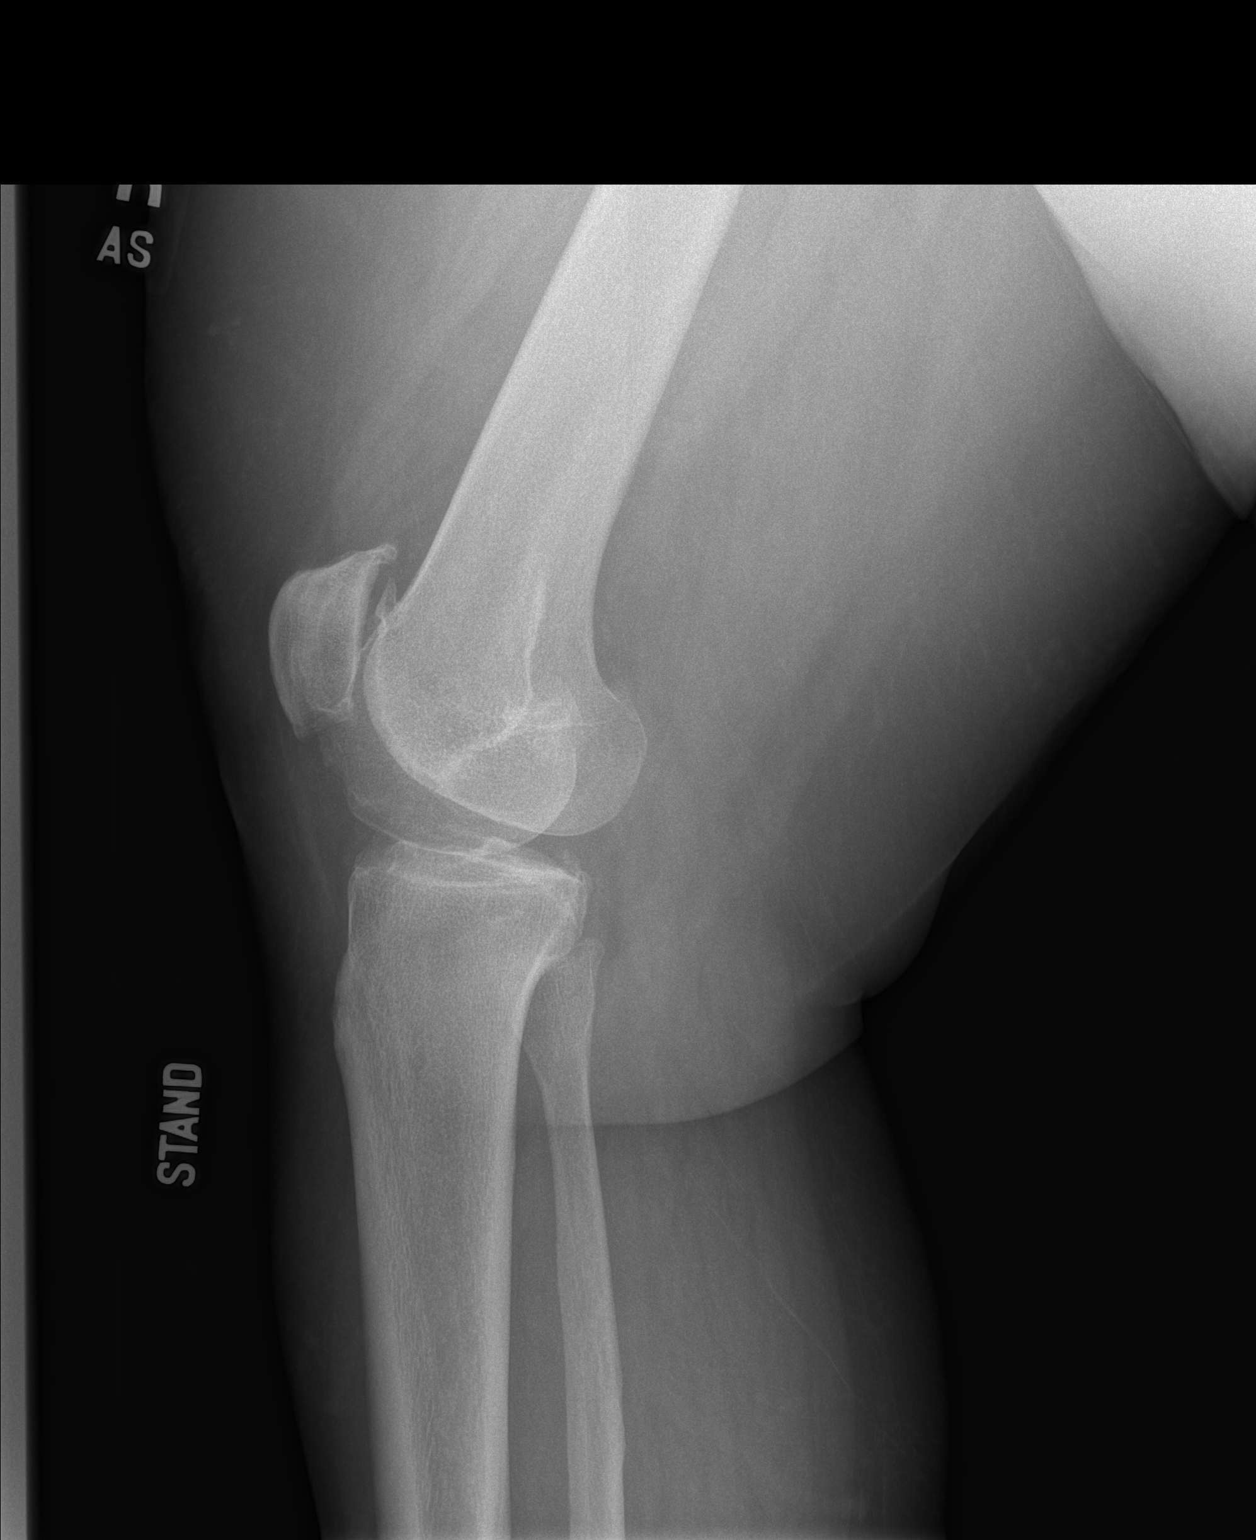

[w knee lat. right]
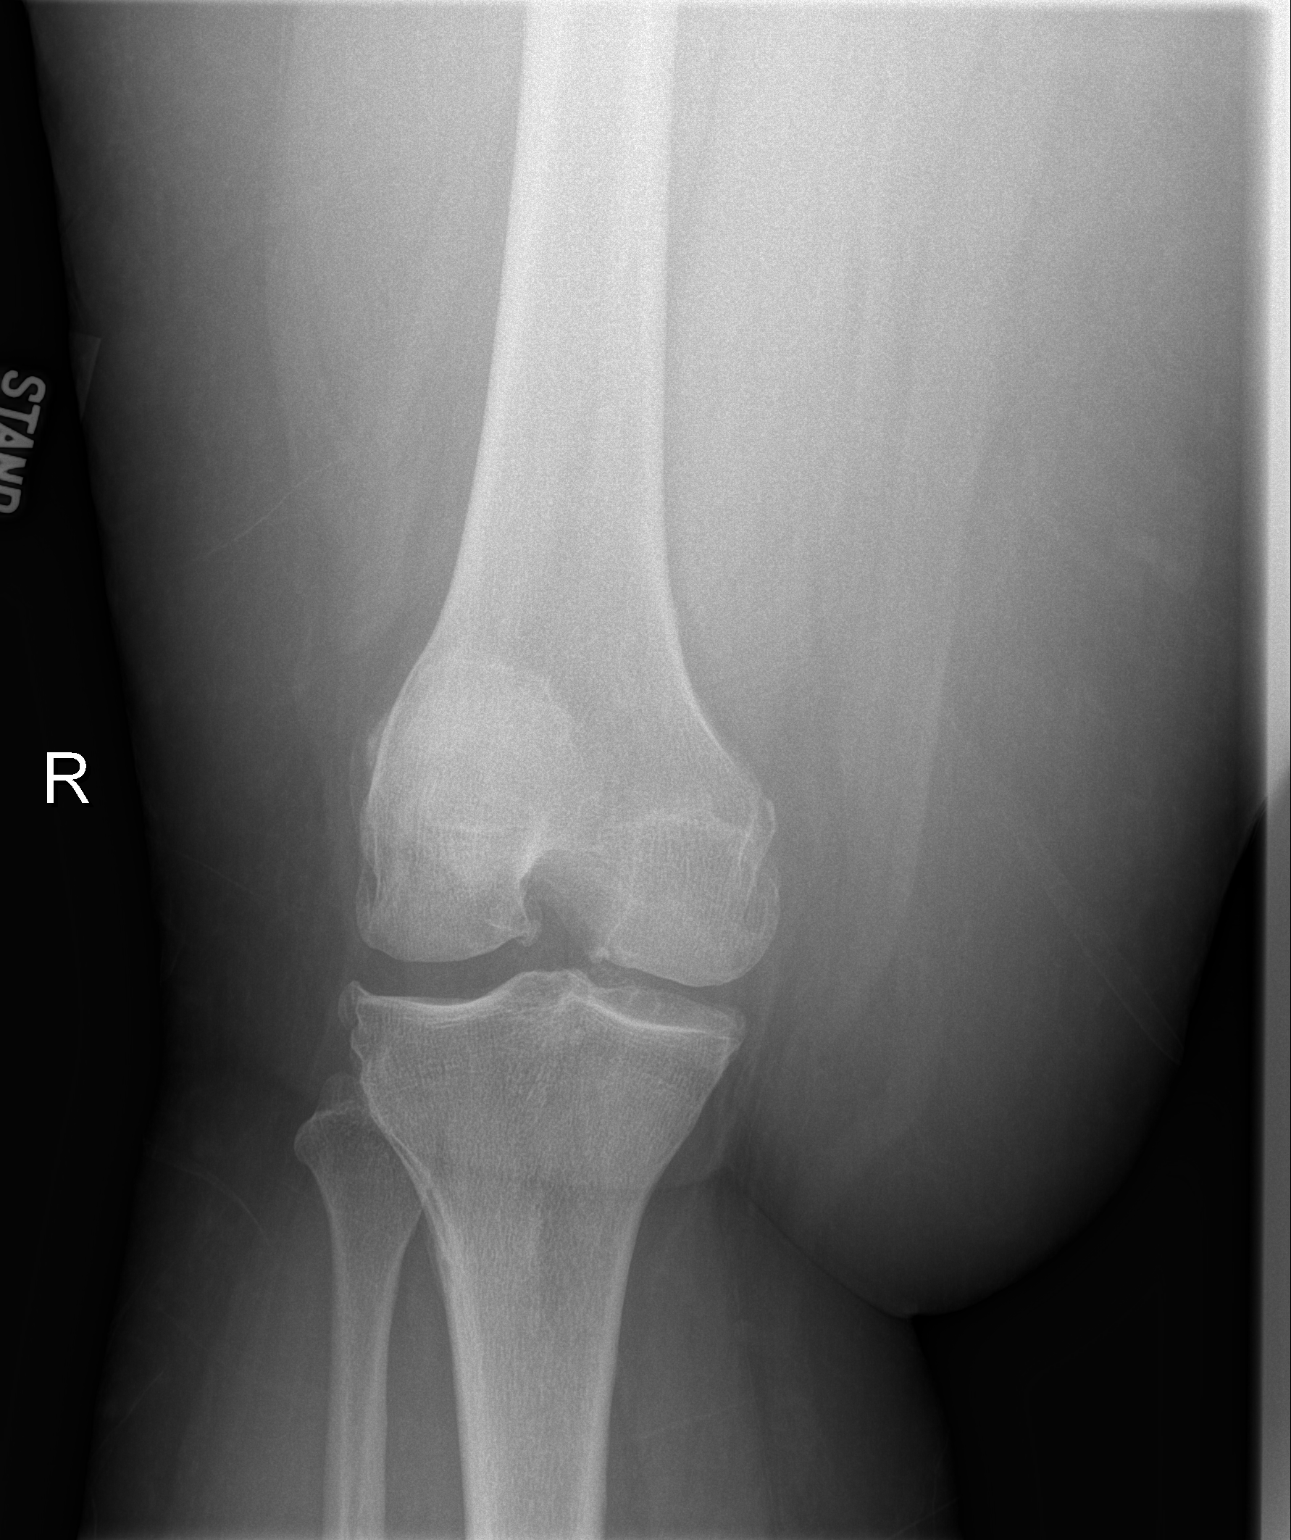

[3 of 3 positions shown; findings below may reference images not displayed]

FINDINGS: Patellofemoral joint space narrowing and spur formation.

Medial lateral compartments are well preserved.

No acute fracture, dislocation or bone destruction.

No knee joint effusion.
IMPRESSION: Degenerative changes RIGHT knee greatest at patellofemoral joint.

## 2018-01-30 ENCOUNTER — Other Ambulatory Visit (INDEPENDENT_AMBULATORY_CARE_PROVIDER_SITE_OTHER): Payer: Self-pay | Admitting: Family Medicine

## 2018-05-25 ENCOUNTER — Ambulatory Visit: Payer: Self-pay | Admitting: Family Medicine

## 2018-05-27 ENCOUNTER — Other Ambulatory Visit: Payer: Self-pay | Admitting: Obstetrics & Gynecology

## 2018-05-27 ENCOUNTER — Encounter: Payer: Self-pay | Admitting: Obstetrics & Gynecology

## 2018-05-29 NOTE — Progress Notes (Signed)
Spoke with patient regarding her [place] results.  6 cm left adnexal cyst appears stable (slightly smaller compared to her previous study).  Pt denies any pain or other sx.  F/u for annual exam.

## 2018-06-03 ENCOUNTER — Other Ambulatory Visit: Payer: Self-pay | Admitting: Obstetrics & Gynecology

## 2018-06-04 ENCOUNTER — Encounter: Payer: Self-pay | Admitting: Obstetrics & Gynecology

## 2018-06-16 ENCOUNTER — Ambulatory Visit: Payer: Self-pay | Attending: Family Medicine | Admitting: Family Medicine

## 2018-06-16 ENCOUNTER — Encounter: Payer: Self-pay | Admitting: Family Medicine

## 2018-06-16 VITALS — BP 117/74 | HR 74 | Temp 98.6°F | Resp 18 | Ht 65.0 in | Wt 272.0 lb

## 2018-06-16 DIAGNOSIS — I10 Essential (primary) hypertension: Secondary | ICD-10-CM

## 2018-06-16 DIAGNOSIS — J4520 Mild intermittent asthma, uncomplicated: Secondary | ICD-10-CM

## 2018-06-16 DIAGNOSIS — Z8249 Family history of ischemic heart disease and other diseases of the circulatory system: Secondary | ICD-10-CM | POA: Insufficient documentation

## 2018-06-16 DIAGNOSIS — Z79899 Other long term (current) drug therapy: Secondary | ICD-10-CM

## 2018-06-16 DIAGNOSIS — Z9049 Acquired absence of other specified parts of digestive tract: Secondary | ICD-10-CM | POA: Insufficient documentation

## 2018-06-16 DIAGNOSIS — Z1211 Encounter for screening for malignant neoplasm of colon: Secondary | ICD-10-CM

## 2018-06-16 DIAGNOSIS — Z9889 Other specified postprocedural states: Secondary | ICD-10-CM | POA: Insufficient documentation

## 2018-06-16 DIAGNOSIS — Z880 Allergy status to penicillin: Secondary | ICD-10-CM | POA: Insufficient documentation

## 2018-06-16 DIAGNOSIS — H6123 Impacted cerumen, bilateral: Secondary | ICD-10-CM

## 2018-06-16 DIAGNOSIS — Z833 Family history of diabetes mellitus: Secondary | ICD-10-CM | POA: Insufficient documentation

## 2018-06-16 DIAGNOSIS — G4730 Sleep apnea, unspecified: Secondary | ICD-10-CM | POA: Insufficient documentation

## 2018-06-16 DIAGNOSIS — Z23 Encounter for immunization: Secondary | ICD-10-CM

## 2018-06-16 DIAGNOSIS — H6691 Otitis media, unspecified, right ear: Secondary | ICD-10-CM

## 2018-06-16 MED ORDER — LISINOPRIL 40 MG PO TABS: 40.0000 mg | ORAL_TABLET | Freq: Every day | ORAL | 1 refills | Status: DC

## 2018-06-16 MED ORDER — METOPROLOL TARTRATE 50 MG PO TABS: 50.0000 mg | ORAL_TABLET | Freq: Two times a day (BID) | ORAL | 1 refills | Status: DC

## 2018-06-16 MED ORDER — AZITHROMYCIN 250 MG PO TABS: ORAL_TABLET | ORAL | 0 refills | Status: DC

## 2018-06-16 MED ORDER — CHLORTHALIDONE 50 MG PO TABS: 50.0000 mg | ORAL_TABLET | Freq: Every day | ORAL | 1 refills | Status: DC

## 2018-06-16 MED ORDER — ALBUTEROL SULFATE HFA 108 (90 BASE) MCG/ACT IN AERS: 2.0000 | INHALATION_SPRAY | Freq: Four times a day (QID) | RESPIRATORY_TRACT | 11 refills | Status: DC | PRN

## 2018-06-16 MED FILL — LISINOPRIL 40 MG TABLET: 40 | 30 days supply | Qty: 30 | Fill #0

## 2018-06-16 MED FILL — AZITHROMYCIN 250 MG TABLET: 250 | 5 days supply | Qty: 6 | Fill #0

## 2018-06-16 MED FILL — METOPROLOL TARTRATE 50 MG T: 50 | 30 days supply | Qty: 60 | Fill #0

## 2018-06-16 MED FILL — ALBUTEROL SULFATE HFA 108 (: 108 (90 BAS | 25 days supply | Qty: 18 | Fill #0

## 2018-06-16 MED FILL — CHLORTHALIDONE 50 MG TABS: 50 | 30 days supply | Qty: 30 | Fill #0

## 2018-06-16 NOTE — Progress Notes (Signed)
Subjective:    Patient ID: Theresa Pearson, female    DOB: Mar 29, 1966, 52 y.o.   MRN: [id number]  HPI       52 yo female new to me as a patient who was last seen in this office on 08/06/17 in follow-up of hypertension.  Patient reports that she is fasting at today's visit.  Patient has taken her blood pressure medication daily and has had no headaches or dizziness related to her blood pressure.  Patient also with a history of asthma and patient denies any hospitalizations or ED visits related to her asthma.  Patient has infrequent use of her albuterol inhaler.  Patient has no nighttime or daytime issues with cough, wheezing, chest tightness or shortness of breath.  Patient states that her asthma tends to be triggered whenever she also has cold symptoms or there are certain smells that she cannot quite identify but if she smells them when she walks into her room, this will trigger her to have increased chest tightness/cough and sometimes wheezing.      Patient reports that she does not hear well out of her left ear and she was told in the past that she had impacted cerumen.  Patient states that she was supposed to be referred to ear nose and throat more than a year ago but never heard from an ear nose and throat doctor.  Patient tried the use of Debrox but she states that this only caused her ear to stop up more after use.  Patient does have some ear discomfort.  Patient denies any issues with headaches or dizziness.  No shortness of breath or cough.  Patient has not had a colonoscopy due to her lack of insurance.  Patient denies any abdominal pain-no blood in the stool and no dark stools.  Patient denies constipation or any change in bowel habit.  Past Medical History:  Diagnosis Date  . Anemia   . Anxiety   . Arthritis   . Asthma   . Chronic headaches   . Depression   . Diverticulosis   . Fibromyalgia   . Gallstones   . GERD (gastroesophageal reflux disease)   . Hypertension   . Pneumonia   .  Right leg DVT ([place])   . Sleep apnea    Past Surgical History:  Procedure Laterality Date  . ABDOMINAL HYSTERECTOMY  2008   for  fibroids   . CESAREAN SECTION    . CHOLECYSTECTOMY     Family History  Problem Relation Age of Onset  . CAD Mother   . Diabetes Mellitus II Mother   . Stroke Mother   . Colon polyps Mother   . Crohn's disease Mother   . Allergy (severe) Sister   . Colon polyps Sister   . Allergy (severe) Son   . Prostate cancer Maternal Grandfather   . Diabetes Maternal Grandmother   . Hypertension Maternal Grandmother   . Heart attack Maternal Grandmother    Social History   Tobacco Use  . Smoking status: Never Smoker  . Smokeless tobacco: Never Used  Substance Use Topics  . Alcohol use: Yes    Alcohol/week: 1.0 - 2.0 standard drinks    Types: 1 - 2 Glasses of wine per week    Comment: occoasional.  . Drug use: No   Allergies  Allergen Reactions  . Penicillins Shortness Of Breath and Rash      Review of Systems  Constitutional: Negative for chills, fatigue and fever.  HENT: Positive for  ear pain. Negative for congestion, trouble swallowing and voice change.   Eyes: Negative for photophobia and visual disturbance.  Respiratory: Negative for cough, shortness of breath and wheezing.   Cardiovascular: Negative for chest pain, palpitations and leg swelling.  Gastrointestinal: Negative for abdominal pain, blood in stool and constipation.  Endocrine: Negative for polydipsia, polyphagia and polyuria.  Genitourinary: Negative for dysuria and frequency.  Neurological: Negative for dizziness and headaches.  Hematological: Negative for adenopathy. Does not bruise/bleed easily.       Objective:   Physical Exam BP 117/74 (BP Location: Left Arm, Patient Position: Sitting, Cuff Size: Large)   Pulse 74   Temp 98.6 F (37 C) (Oral)   Resp 18   Ht 5\' 5"  (1.651 m)   Wt 272 lb (123.4 kg)   SpO2 97%   BMI 45.26 kg/m Nurse's notes and vital signs  reviewed General-well-nourished, well-developed, overweight for height/obese older female in no acute distress ENT- patient with bilateral cerumen impaction, some wax was removed from the right ear canal with the use of a curette.  The upper part of the TM was visualized which was dark pink.  Patient did have complaint of throbbing type of ear pain.  Left canal was completely occluded by hard wax.  Nares with edema of the nasal turbinates, patient with some posterior pharynx erythema and a slightly narrowed posterior airway secondary to large tongue base and body habitus.   Neck-supple, no lymphadenopathy, no thyromegaly, no carotid bruit Lungs-clear to auscultation bilaterally, no wheeze, no increased work of breathing Cardiovascular-regular rate and rhythm Abdomen-truncal obesity, soft and nontender Back-no CVA tenderness Extremities-no edema        Assessment & Plan:  1. Essential hypertension Patient's blood pressures controlled on chlorthalidone, lisinopril and metoprolol which were refilled at today's visit.  DASH diet and exercise along with weight loss were encouraged.  Patient will have BMP and lipid panel in follow-up of hypertension and obesity. - Lipid panel - Basic Metabolic Panel  2. Mild intermittent asthma without complication Patient with mild intermittent asthma and prescription was sent to patient's pharmacy for refill of albuterol to use as needed.  Patient also agreed to have influenza immunization at today's visit.  3. Screening for colon cancer Patient will do home fecal occult blood testing and will be notified of the results. - Fecal occult blood, imunochemical([facility])  4. Need for immunization against influenza Patient agreed to have influenza immunization which was done at today's visit and patient was given informational handout regarding influenza immunization. - Flu Vaccine QUAD 36+ mos IM  5. Bilateral impacted cerumen Patient provided with  samples of Debrox to use due to bilateral impacted cerumen.  Patient had partial removal of impacted cerumen from the right canal but appears to have otitis media.  Prescription given for treatment of otitis media and patient should not use Debrox until her ear pain resolves.  Patient may use Debrox in the left ear.  Patient may return for nurse visit to have ear lavage for removal of cerumen.  Patient did have improvement in hearing from the right ear after partial removal of impacted cerumen  6. Encounter for long-term (current) use of medications Patient will have BMP in follow-up of long-term use of medications for treatment of hypertension as her medications may cause changes in electrolytes and renal function. - Basic Metabolic Panel  7. Right otitis media, unspecified otitis media type Prescription sent to pharmacy for azithromycin Z-Pak for treatment of right otitis media as patient is  allergic to penicillin/amoxicillin.  Patient should call or return if her ear pain does not resolve. - azithromycin (ZITHROMAX) 250 MG tablet; Two pills on the first day then one daily for 4 days  Dispense: 6 tablet; Refill: 0  An After Visit Summary was printed and given to the patient.  Return in about 6 months (around 12/16/2018) for HTN/asthma.

## 2018-06-17 LAB — BASIC METABOLIC PANEL WITH GFR
BUN/Creatinine Ratio: 18 (ref 9–23)
BUN: 14 mg/dL (ref 6–24)
CO2: 26 mmol/L (ref 20–29)
Calcium: 9.3 mg/dL (ref 8.7–10.2)
Chloride: 97 mmol/L (ref 96–106)
Creatinine, Ser: 0.76 mg/dL (ref 0.57–1.00)
GFR calc Af Amer: 105 mL/min/1.73
GFR calc non Af Amer: 91 mL/min/1.73
Glucose: 81 mg/dL (ref 65–99)
Potassium: 3.5 mmol/L (ref 3.5–5.2)
Sodium: 140 mmol/L (ref 134–144)

## 2018-06-17 LAB — LIPID PANEL
Chol/HDL Ratio: 4.7 ratio — ABNORMAL HIGH (ref 0.0–4.4)
Cholesterol, Total: 182 mg/dL (ref 100–199)
HDL: 39 mg/dL — ABNORMAL LOW
LDL Calculated: 121 mg/dL — ABNORMAL HIGH (ref 0–99)
Triglycerides: 110 mg/dL (ref 0–149)
VLDL Cholesterol Cal: 22 mg/dL (ref 5–40)

## 2018-06-19 ENCOUNTER — Telehealth: Payer: Self-pay | Admitting: *Deleted

## 2018-06-19 NOTE — Telephone Encounter (Signed)
Patient verified DOB Patient is aware of labs being normal except for cholesterol being elevated and needing to implement nutritional and physical changes. Patient is aware of a recheck being completed and medication being added if no changes are noted in results. No further questions.

## 2018-06-19 NOTE — Telephone Encounter (Signed)
-----   Message from Antony Blackbird, MD sent at 06/19/2018 11:57 AM EST ----- Please notify patient that her BMP was normal but her lipid panel showed an increase in her bad cholesterol at 121, her goal is 100 or less for the LDL. I can send in a cholesterol medication for her or she can try a low fat diet and exercise for the next few months and then recheck lipids and if still elevated then start medication

## 2018-07-13 MED FILL — CHLORTHALIDONE 50 MG TABS: 50 | 30 days supply | Qty: 30 | Fill #1

## 2018-07-13 MED FILL — METOPROLOL TARTRATE 50 MG T: 50 | 30 days supply | Qty: 60 | Fill #1

## 2018-07-13 MED FILL — LISINOPRIL 40 MG TABLET: 40 | 30 days supply | Qty: 30 | Fill #1

## 2018-08-07 ENCOUNTER — Other Ambulatory Visit: Payer: Self-pay | Admitting: Physician Assistant

## 2018-08-07 DIAGNOSIS — I10 Essential (primary) hypertension: Secondary | ICD-10-CM

## 2018-08-12 ENCOUNTER — Other Ambulatory Visit: Payer: Self-pay | Admitting: Family Medicine

## 2018-08-12 DIAGNOSIS — F411 Generalized anxiety disorder: Secondary | ICD-10-CM

## 2018-08-12 MED ORDER — ESCITALOPRAM OXALATE 20 MG PO TABS: 20.0000 mg | ORAL_TABLET | Freq: Every day | ORAL | 2 refills | Status: DC

## 2018-08-12 NOTE — Telephone Encounter (Signed)
Pt has refills at [facility], she should call [facility] to initiate a transfer.

## 2018-08-12 NOTE — Telephone Encounter (Signed)
1) Medication(s) Requested (by name): -chlorthalidone (HYGROTON) 50 MG  -metoprolol tartrate (LOPRESSOR) 50 MG  -lisinopril (PRINIVIL,ZESTRIL) 40 MG tablet  -Depression Medication 2) Pharmacy of Choice: -[place], [place] - [address] AT [place] [address]) Special Requests:   Approved medications will be sent to the pharmacy, we will reach out if there is an issue.  Requests made after 3pm may not be addressed until the following business day!  If a patient is unsure of the name of the medication(s) please note and ask patient to call back when they are able to provide all info, do not send to responsible party until all information is available!

## 2018-08-16 ENCOUNTER — Other Ambulatory Visit: Payer: Self-pay | Admitting: Physician Assistant

## 2018-08-16 DIAGNOSIS — K219 Gastro-esophageal reflux disease without esophagitis: Secondary | ICD-10-CM

## 2018-08-17 ENCOUNTER — Other Ambulatory Visit: Payer: Self-pay | Admitting: Physician Assistant

## 2018-08-17 ENCOUNTER — Other Ambulatory Visit: Payer: Self-pay | Admitting: Pharmacist

## 2018-08-17 DIAGNOSIS — I10 Essential (primary) hypertension: Secondary | ICD-10-CM

## 2018-08-17 MED ORDER — CHLORTHALIDONE 50 MG PO TABS: 50.0000 mg | ORAL_TABLET | Freq: Every day | ORAL | 0 refills | Status: DC

## 2018-08-17 NOTE — Telephone Encounter (Signed)
Medications were transferred to [facility] on 08/14/18. She should contact her pharmacy directly with any further issues.

## 2018-08-17 NOTE — Telephone Encounter (Signed)
Patient was notified of message an says pharmacy has not be able to complete the request and states she does not have anymore medication. Please follow up.

## 2018-09-11 ENCOUNTER — Other Ambulatory Visit: Payer: Self-pay | Admitting: Physician Assistant

## 2018-09-11 DIAGNOSIS — I10 Essential (primary) hypertension: Secondary | ICD-10-CM

## 2018-09-16 ENCOUNTER — Emergency Department

## 2018-09-16 ENCOUNTER — Emergency Department
Admission: EM | Admit: 2018-09-16 | Discharge: 2018-09-16 | Disposition: A | Discharge status: Home / Self Care | Attending: Emergency Medicine | Admitting: Emergency Medicine

## 2018-09-16 DIAGNOSIS — M5412 Radiculopathy, cervical region: Secondary | ICD-10-CM | POA: Insufficient documentation

## 2018-09-16 LAB — COMPREHENSIVE METABOLIC PANEL
ALT: 13 U/L (ref 0–55)
AST (SGOT): 21 U/L (ref 5–34)
Albumin/Globulin Ratio: 1.8 (ref 0.9–2.2)
Albumin: 4.3 g/dL (ref 3.5–5.0)
Alkaline Phosphatase: 62 U/L (ref 37–106)
Anion Gap: 8 (ref 5.0–15.0)
BUN: 11 mg/dL (ref 7.0–19.0)
Bilirubin, Total: 0.9 mg/dL (ref 0.2–1.2)
CO2: 27 mEq/L (ref 22–29)
Calcium: 8.9 mg/dL (ref 8.5–10.5)
Chloride: 106 mEq/L (ref 100–111)
Creatinine: 0.8 mg/dL (ref 0.6–1.0)
Globulin: 2.4 g/dL (ref 2.0–3.6)
Glucose: 87 mg/dL (ref 70–100)
Potassium: 4.3 mEq/L (ref 3.5–5.1)
Protein, Total: 6.7 g/dL (ref 6.0–8.3)
Sodium: 141 mEq/L (ref 136–145)

## 2018-09-16 LAB — CBC AND DIFFERENTIAL
Absolute NRBC: 0 10*3/uL (ref 0.00–0.00)
Basophils Absolute Automated: 0.04 10*3/uL (ref 0.00–0.08)
Basophils Automated: 0.8 %
Eosinophils Absolute Automated: 0.05 10*3/uL (ref 0.00–0.44)
Eosinophils Automated: 0.9 %
Hematocrit: 41.7 % (ref 34.7–43.7)
Hgb: 14.1 g/dL (ref 11.4–14.8)
Immature Granulocytes Absolute: 0 10*3/uL (ref 0.00–0.07)
Immature Granulocytes: 0 %
Lymphocytes Absolute Automated: 1.87 10*3/uL (ref 0.42–3.22)
Lymphocytes Automated: 35.4 %
MCH: 30.2 pg (ref 25.1–33.5)
MCHC: 33.8 g/dL (ref 31.5–35.8)
MCV: 89.3 fL (ref 78.0–96.0)
MPV: 11.9 fL (ref 8.9–12.5)
Monocytes Absolute Automated: 0.44 10*3/uL (ref 0.21–0.85)
Monocytes: 8.3 %
Neutrophils Absolute: 2.88 10*3/uL (ref 1.10–6.33)
Neutrophils: 54.6 %
Nucleated RBC: 0 /100 WBC (ref 0.0–0.0)
Platelets: 195 10*3/uL (ref 142–346)
RBC: 4.67 10*6/uL (ref 3.90–5.10)
RDW: 13 % (ref 11–15)
WBC: 5.28 10*3/uL (ref 3.10–9.50)

## 2018-09-16 LAB — GFR: EGFR: >60

## 2018-09-16 LAB — TROPONIN I: Troponin I: <0.01 ng/mL (ref 0.00–0.05)

## 2018-09-16 NOTE — Discharge Instructions (Signed)
Cervical Radiculopathy    You have been seen for a cervical radiculopathy.    Your spine has bones called "vertebrae." In between the bones there are soft cushions. These are called "disks." The disks keep the vertebrae from rubbing against each other. Inside of each disk is a thick, jelly-like substance called the "nucleus pulposus." Sometimes when the spine is injured, a disk is damaged and the nucleus pulposus leaks out of the disk. This is called a "disk herniation." As people age, the disks get brittle and delicate. If this happens, you might have a disk herniation. This is possible even if you don t remember getting injured.    Sometimes a herniated disk presses on a nerve in the spine. This causes pain near the herniated disk. Since you have symptoms in your neck and arm, the problem is in the cervical (neck) spine. Other problems can cause a radiculopathy (nerve pain), including a narrowed opening where the nerves come out.    Some symptoms of a cervical radiculopathy are:     Pain down one of your arms.   A burning feeling down your arm.   Numbness (pins and needles or loss of feeling) in your arm.   In serious cases, your arm may feel weak.    This problem is often treated with rest, physical therapy, and medication for the swelling and pain. If these treatments don t work, surgery may be needed. Sometimes taking steroids (like Prednisone) for a few days can help the pain.    We don't believe your condition is dangerous right now. However, you need to be careful. Sometimes a problem that seems small can get serious later. Therefore, it is very important for you to come back here or go to the nearest Emergency Department if you don t get better or your symptoms get worse.     You may have been referred to get an MRI of your spine or an EMG. These tests look at your problem more closely.    Follow up with your doctor or the referral doctor as soon as possible.    YOU SHOULD SEEK MEDICAL  ATTENTION IMMEDIATELY, EITHER HERE OR AT THE NEAREST EMERGENCY DEPARTMENT, IF ANY OF THE FOLLOWING OCCURS:     You lose bowel or bladder control (you soil or wet yourself).   You feel week or can t use your arm(s).   The medication doesn t help the pain.   You have a fever (temperature higher than 100.4F or 38C) or shaking chills.   You have serious pain over one bone (vertebra) in your neck.    If you can t follow up with your doctor, or if at any time you feel you need to be rechecked or seen again, come back here or go to the nearest emergency department.

## 2018-09-16 NOTE — ED Provider Notes (Signed)
EMERGENCY DEPARTMENT HISTORY AND PHYSICAL EXAM     Physician/Midlevel provider first contact with patient: 09/16/18 1610         Date: 09/16/2018  Patient Name: Elizabeth Dougherty    History of Presenting Illness     Chief Complaint   Patient presents with    Tingling         Additional History: Elizabeth Dougherty is a 53 y.o. female presenting to the ED with L arm tingling x 2.5 hours.  The tingling is now mostly in the hand and it feels numb.  She noticed the sx soon after waking up.  She has no neck pain or soreness.  No heavy lifting yesterday.  She has not speech difficulty and no issue with strength int he upper or lower extremities.        PCP: No primary care provider on file.  SPECIALISTS:    No current facility-administered medications for this encounter.      No current outpatient medications on file.       Past History     Past Medical History:  Past Medical History:   Diagnosis Date    Abnormal Pap smear     Genital warts     Hemorrhoids without complication        Past Surgical History:  Past Surgical History:   Procedure Laterality Date    BREAST BIOPSY      CERVICAL BIOPSY  W/ LOOP ELECTRODE EXCISION         Family History:  History reviewed. No pertinent family history.    Social History:  Social History     Tobacco Use    Smoking status: Never Smoker    Smokeless tobacco: Never Used   Substance Use Topics    Alcohol use: Not Currently     Alcohol/week: 0.0 standard drinks     Comment: occasional    Drug use: Never       Allergies:  No Known Allergies    Review of Systems   Review of Systems   Constitutional: Negative for chills and fever.   Respiratory: Negative for shortness of breath.    Cardiovascular: Negative for chest pain.   Gastrointestinal: Negative for diarrhea, nausea and vomiting.   All other systems reviewed and are negative.        Physical Exam   BP 116/60    Pulse 67    Temp 97.5 F (36.4 C) (Oral)    Resp 16    Ht 5\' 5"  (1.651 m)    Wt 56.7 kg    LMP 11/12/2017    SpO2 100%     BMI 20.80 kg/m   Physical Exam  Vitals signs and nursing note reviewed.   Constitutional:       General: She is not in acute distress.     Appearance: Normal appearance. She is well-developed.   HENT:      Head: Normocephalic and atraumatic.      Nose: Nose normal.      Mouth/Throat:      Mouth: Mucous membranes are moist.   Eyes:      Extraocular Movements: Extraocular movements intact.      Conjunctiva/sclera: Conjunctivae normal.   Neck:      Musculoskeletal: Normal range of motion and neck supple.   Cardiovascular:      Rate and Rhythm: Normal rate and regular rhythm.      Heart sounds: Normal heart sounds. No murmur. No friction rub. No gallop.  Pulmonary:      Effort: Pulmonary effort is normal. No respiratory distress.      Breath sounds: Normal breath sounds. No wheezing or rales.   Abdominal:      General: Abdomen is flat. There is no distension.      Palpations: Abdomen is soft. There is no mass.      Tenderness: There is no abdominal tenderness. There is no guarding or rebound.   Musculoskeletal: Normal range of motion.         General: No swelling, deformity or signs of injury.   Skin:     General: Skin is warm and dry.      Findings: No rash.   Neurological:      General: No focal deficit present.      Mental Status: She is alert. Mental status is at baseline.      Cranial Nerves: No cranial nerve deficit.   Psychiatric:         Mood and Affect: Mood normal.         Behavior: Behavior normal.           Diagnostic Study Results     Labs -     Results     Procedure Component Value Units Date/Time    Troponin I [[id number]] Collected:  09/16/18 0942    Specimen:  Blood Updated:  09/16/18 1013     Troponin I <0.01 ng/mL     Comprehensive metabolic panel [[id number]] Collected:  09/16/18 0942    Specimen:  Blood Updated:  09/16/18 1008     Glucose 87 mg/dL      BUN 53.6 mg/dL      Creatinine 0.8 mg/dL      Sodium 644 mEq/L      Potassium 4.3 mEq/L      Chloride 106 mEq/L      CO2 27 mEq/L      Calcium 8.9  mg/dL      Protein, Total 6.7 g/dL      Albumin 4.3 g/dL      AST (SGOT) 21 U/L      ALT 13 U/L      Alkaline Phosphatase 62 U/L      Bilirubin, Total 0.9 mg/dL      Globulin 2.4 g/dL      Albumin/Globulin Ratio 1.8     Anion Gap 8.0    GFR [[id number]] Collected:  09/16/18 0942     Updated:  09/16/18 1008     EGFR >60.0    CBC and differential [[id number]] Collected:  09/16/18 0942    Specimen:  Blood Updated:  09/16/18 0948     WBC 5.28 x10 3/uL      Hgb 14.1 g/dL      Hematocrit 33.2 %      Platelets 195 x10 3/uL      RBC 4.67 x10 6/uL      MCV 89.3 fL      MCH 30.2 pg      MCHC 33.8 g/dL      RDW 13 %      MPV 11.9 fL      Neutrophils 54.6 %      Lymphocytes Automated 35.4 %      Monocytes 8.3 %      Eosinophils Automated 0.9 %      Basophils Automated 0.8 %      Immature Granulocyte 0.0 %      Nucleated RBC 0.0 /100 WBC      Neutrophils  Absolute 2.88 x10 3/uL      Abs Lymph Automated 1.87 x10 3/uL      Abs Mono Automated 0.44 x10 3/uL      Abs Eos Automated 0.05 x10 3/uL      Absolute Baso Automated 0.04 x10 3/uL      Absolute Immature Granulocyte 0.00 x10 3/uL      Absolute NRBC 0.00 x10 3/uL           Radiologic Studies -   Radiology Results (24 Hour)     Procedure Component Value Units Date/Time    Chest AP Portable [[id number]] Collected:  09/16/18 0954    Order Status:  Completed Updated:  09/16/18 0958    Narrative:       XR CHEST AP PORTABLE    CLINICAL INDICATION: chest pain     TECHNIQUE: The following radiographs obtained per protocol:XR CHEST AP  PORTABLE    COMPARISON: None.    FINDINGS:    Cardiomediastinal silhouette is within normal limits.  No infiltrate. No  effusion. The lungs are clear.  No acute abnormalities are apparent. No  active disease.       Impression:           No acute abnormality.    Elizabeth Maffucci, MD   09/16/2018 9:54 AM    CT Head without Contrast [[id number]] Collected:  09/16/18 0926    Order Status:  Completed Updated:  09/16/18 0934    Narrative:       CT HEAD WO CONTRAST    CLINICAL  INDICATION:   Numbness or tingling, paresthesia (Ped 0-18y)    COMPARISON: None    TECHNIQUE: 5 mm axial images from the skull base to the vertex. The  following ?dose reduction techniques were utilized: automated exposure  control and/or adjustment of the mA and/or kV according to patient  size, and the use of iterative reconstruction technique.    FINDINGS:     The ventricles, cisterns, and sulci appear within normal size limits.  There is no mass effect or midline shift. There is no hemorrhage or  abnormal extra-axial fluid collection. The gray-white differentiation  appears maintained. Bone windows demonstrate no evidence for acute  osseous abnormality. The included paranasal sinuses and mastoid air  cells appear clear.      Impression:        No acute intracranial process.    Sandie Ano, MD   09/16/2018 9:30 AM      .    Medical Decision Making   I am the first provider for this patient.    I reviewed the vital signs, available nursing notes, past medical history, past surgical history, family history and social history.    Vital Signs-Reviewed the patient's vital signs.     Patient Vitals for the past 12 hrs:   BP Temp Pulse Resp   09/16/18 0914 116/60 -- -- --   09/16/18 0912 -- 97.5 F (36.4 C) 67 16       EKG:  Interpreted by the EP.   Rate: 66   Rhythm: NSR   Intervals: normal  Ectopy: none   ST or T wave changes: none, no STEMI    ED Course: Sx have improved while pt was in the ER>         Medical Decision Making: Pt with upper extremity tingling which likely represents radiculopathy.  The neurologic exam is otherwise normal and pt has equal strength in b/l upper and lower extremities.  They  have no speech difficulty, CN abn, or difficulty with coordination.  Pt has no midline cervical spine tenderness.  They will be treated conservatively and referred to primary care for follow up.         Diagnosis     Clinical Impression:   1. Cervical radiculopathy        Treatment Plan:   ED Disposition     ED  Disposition Condition Date/Time Comment    Discharge  Wed Sep 16, 2018 10:18 AM Elizabeth Dougherty discharge to home/self care.    Condition at disposition: Stable            _______________________________    Verlee Rossetti, MD    _______________________________     Verlee Rossetti, MD  09/16/18 [phone]

## 2018-09-16 NOTE — ED Triage Notes (Signed)
Pt c/o tingling down left arm since 7:00 am today.  Pt denies headache, blurred vision and headache.

## 2018-09-17 LAB — ECG 12-LEAD
Atrial Rate: 66 {beats}/min
P Axis: 78 degrees
P-R Interval: 120 ms
Q-T Interval: 416 ms
QRS Duration: 82 ms
QTC Calculation (Bezet): 436 ms
R Axis: 73 degrees
T Axis: 31 degrees
Ventricular Rate: 66 {beats}/min

## 2018-10-08 ENCOUNTER — Ambulatory Visit: Payer: Self-pay | Admitting: Family Medicine

## 2018-10-12 ENCOUNTER — Ambulatory Visit: Payer: Self-pay | Admitting: Family Medicine

## 2018-11-27 ENCOUNTER — Ambulatory Visit (INDEPENDENT_AMBULATORY_CARE_PROVIDER_SITE_OTHER): Admitting: Obstetrics & Gynecology

## 2018-11-27 ENCOUNTER — Encounter (INDEPENDENT_AMBULATORY_CARE_PROVIDER_SITE_OTHER): Payer: Self-pay | Admitting: Obstetrics & Gynecology

## 2018-11-27 VITALS — BP 112/80 | Ht 64.0 in | Wt 131.0 lb

## 2018-11-27 DIAGNOSIS — Z01419 Encounter for gynecological examination (general) (routine) without abnormal findings: Secondary | ICD-10-CM

## 2018-11-27 DIAGNOSIS — Z1212 Encounter for screening for malignant neoplasm of rectum: Secondary | ICD-10-CM

## 2018-11-27 DIAGNOSIS — Z1231 Encounter for screening mammogram for malignant neoplasm of breast: Secondary | ICD-10-CM

## 2018-11-27 DIAGNOSIS — N83202 Unspecified ovarian cyst, left side: Secondary | ICD-10-CM

## 2018-11-27 DIAGNOSIS — Z1151 Encounter for screening for human papillomavirus (HPV): Secondary | ICD-10-CM

## 2018-11-27 LAB — POCT OCCULT BLOOD STOOL: Stool Occult Blood: NEGATIVE

## 2018-11-27 LAB — POCT UA URISTIX
Glucose, UA POCT: NEGATIVE mg/dL
POCT Protein, UA: NEGATIVE mg/dL

## 2018-11-27 NOTE — Progress Notes (Signed)
Subjective:       Elizabeth Dougherty is a 53 y.o. female here for a routine exam.  Current complaints: none.  Personal health questionnaire reviewed: yes.  Pt denies any VB, d/c, dysuria, or dyspareunia.  Nl BM's.  No incontinence.  All 3 kids are doing well.  Hilary Hertz daughter is in 10th grade, younger daughter in 50th, and son in 59th.    Gynecologic History  Patient's last menstrual period was 11/12/2017.  Contraception: vasectomy  Breast self exam: discussed  Common GYN tests  Last Pap Date: 11/21/15  Last Pap Result: Normal  Last Mammo Date: 06/04/18  Last Mammo Result: Normal  Last Colonoscopy Date: (Due)    The following portions of the patient's history were reviewed and updated as appropriate: allergies, current medications, past family history, past medical history, past social history, past surgical history and problem list.      Review of Systems  Pertinent items are noted in HPI.      Objective:      BP 112/80    Ht 5\' 4"  (1.626 m)    Wt 131 lb (59.4 kg)    LMP 11/12/2017    BMI 22.49 kg/m   General appearance: alert, appears stated age and cooperative  Neck: no adenopathy, supple, symmetrical, trachea midline and thyroid not enlarged, symmetric, no tenderness/mass/nodules  Breasts: normal appearance, no masses or tenderness, No nipple retraction or dimpling, No nipple discharge or bleeding, No axillary or supraclavicular adenopathy  Abdomen: soft, non-tender; bowel sounds normal; no masses,  no organomegaly  Pelvic exam:     Urinary system: urethral meatus normal   External genitalia: normal general appearance   Vaginal: normal rugae   Cervix: normal appearance   Adnexa: normal bimanual exam   Uterus: normal single, nontender   Rectal: good sphincter tone, no masses and guaiac negative          Assessment:      Healthy female exam.        Plan:      Mammogram ordered.  Follow up in: 1 year.  Thin prep pap/HPV Yes    Discussed colonoscopy vs Cologuard.  F/u sono referral given for left adnexal cyst.

## 2018-12-02 LAB — IMAGE GUIDED PAP WITH APTIMA HPV TP
.: 0
HPV APTIMA: NEGATIVE

## 2018-12-29 ENCOUNTER — Encounter (INDEPENDENT_AMBULATORY_CARE_PROVIDER_SITE_OTHER): Payer: Self-pay | Admitting: Advanced Practice Midwife

## 2018-12-30 LAB — CBC AND DIFFERENTIAL
Baso(Absolute): 0 10*3/uL (ref 0.0–0.2)
Basos: 1 %
Eos: 1 %
Eosinophils Absolute: 0.1 10*3/uL (ref 0.0–0.4)
Hematocrit: 41.3 % (ref 34.0–46.6)
Hemoglobin: 13.7 g/dL (ref 11.1–15.9)
Immature Granulocytes Absolute: 0 10*3/uL (ref 0.0–0.1)
Immature Granulocytes: 0 %
Lymphocytes Absolute: 1.5 10*3/uL (ref 0.7–3.1)
Lymphocytes: 39 %
MCH: 29.1 pg (ref 26.6–33.0)
MCHC: 33.2 g/dL (ref 31.5–35.7)
MCV: 88 fL (ref 79–97)
Monocytes Absolute: 0.4 10*3/uL (ref 0.1–0.9)
Monocytes: 10 %
Neutrophils Absolute: 1.8 10*3/uL (ref 1.4–7.0)
Neutrophils: 49 %
Platelets: 188 10*3/uL (ref 150–379)
RBC: 4.7 x10E6/uL (ref 3.77–5.28)
RDW: 13.9 % (ref 12.3–15.4)
WBC: 3.7 10*3/uL (ref 3.4–10.8)

## 2018-12-30 LAB — COMPREHENSIVE METABOLIC PANEL
ALT: 10 IU/L (ref 0–32)
AST (SGOT): 18 IU/L (ref 0–40)
Albumin/Globulin Ratio: 2.2 (ref 1.2–2.2)
Albumin: 4.3 g/dL (ref 3.5–5.5)
Alkaline Phosphatase: 45 IU/L (ref 39–117)
BUN / Creatinine Ratio: 16 (ref 9–23)
BUN: 12 mg/dL (ref 6–24)
Bilirubin, Total: 0.7 mg/dL (ref 0.0–1.2)
CO2: 25 mmol/L (ref 20–29)
Calcium: 9 mg/dL (ref 8.7–10.2)
Chloride: 105 mmol/L (ref 96–106)
Creatinine: 0.77 mg/dL (ref 0.57–1.00)
EGFR: 104 mL/min/{1.73_m2} (ref >59)
EGFR: 90 mL/min/{1.73_m2} (ref >59)
Globulin, Total: 2 g/dL (ref 1.5–4.5)
Glucose: 80 mg/dL (ref 65–99)
Potassium: 5 mmol/L (ref 3.5–5.2)
Protein, Total: 6.3 g/dL (ref 6.0–8.5)
Sodium: 142 mmol/L (ref 134–144)

## 2018-12-30 LAB — THYROID STIMULATING HORMONE (TSH), REFLEX ON ABNORMAL TO FREE T4, SERUM: TSH: 2.42 u[IU]/mL (ref 0.450–4.500)

## 2019-02-12 ENCOUNTER — Other Ambulatory Visit: Payer: Self-pay | Admitting: Family Medicine

## 2019-02-12 DIAGNOSIS — I10 Essential (primary) hypertension: Secondary | ICD-10-CM

## 2019-02-17 ENCOUNTER — Other Ambulatory Visit: Payer: Self-pay | Admitting: Family Medicine

## 2019-02-17 DIAGNOSIS — I10 Essential (primary) hypertension: Secondary | ICD-10-CM

## 2019-02-17 LAB — LYME AB, TOTAL,REFLEX TO WESTERN BLOT (IGG & IGM)
Lyme Disease Ab, Quant, IgM: <0.8 index (ref 0.00–0.79)
Lyme IgG/IgM Ab: <0.91 {ISR} (ref 0.00–0.90)

## 2019-02-17 LAB — VITAMIN D,25 OH,TOTAL: Vitamin D 25-Hydroxy: 38.7 ng/mL (ref 30.0–100.0)

## 2019-02-17 MED ORDER — CHLORTHALIDONE 50 MG PO TABS: 50.0000 mg | ORAL_TABLET | Freq: Every day | ORAL | 0 refills | Status: DC

## 2019-02-17 NOTE — Telephone Encounter (Signed)
Notify patient that requested RX was sent to [facility] and she needs to keep her follow-up appointment prior to any future refills.  Please also notify [facility] pharmacy to discontinue prescription for 30-day supply that was sent in error

## 2019-02-17 NOTE — Telephone Encounter (Signed)
1) Medication(s) Requested (by name): chlorthalidone   2) [facility]   3) Special Requests: Appointment [facility] 03/12/19   Approved medications will be sent to the pharmacy, we will reach out if there is an issue.  Requests made after 3pm may not be addressed until the following business day!  If a patient is unsure of the name of the medication(s) please note and ask patient to call back when they are able to provide all info, do not send to responsible party until all information is available!

## 2019-02-17 NOTE — Progress Notes (Signed)
Patient ID: Theresa Pearson, female   DOB: [birth year], 53 y.o.   MRN: [id number]   Patient requested [facility] but actually per phone call, patient requested that medication refill be sent to [facility] therefore prescription being resent to [facility].  Patient does need office visit prior to future refills

## 2019-02-17 NOTE — Progress Notes (Signed)
Patient ID: Theresa Pearson, female   DOB: 1966/05/04, 53 y.o.   MRN: [id number]   Phone message received that patient needs refill of chlorthalidone for treatment of hypertension.  Patient has not been seen in the office since December 2019.  She has scheduled an upcoming appointment.  30-day supply of requested medication will be sent to her [facility]

## 2019-02-19 NOTE — Telephone Encounter (Signed)
Left message on voicemail.

## 2019-03-12 ENCOUNTER — Ambulatory Visit: Payer: Self-pay | Attending: Family Medicine | Admitting: Family Medicine

## 2019-03-12 ENCOUNTER — Ambulatory Visit (HOSPITAL_BASED_OUTPATIENT_CLINIC_OR_DEPARTMENT_OTHER): Payer: Self-pay | Admitting: Pharmacist

## 2019-03-12 ENCOUNTER — Encounter: Payer: Self-pay | Admitting: Family Medicine

## 2019-03-12 ENCOUNTER — Other Ambulatory Visit: Payer: Self-pay

## 2019-03-12 ENCOUNTER — Encounter: Payer: Self-pay | Admitting: Pharmacist

## 2019-03-12 VITALS — BP 134/83 | HR 79 | Temp 98.9°F | Resp 18 | Ht 65.0 in | Wt 261.0 lb

## 2019-03-12 DIAGNOSIS — R194 Change in bowel habit: Secondary | ICD-10-CM

## 2019-03-12 DIAGNOSIS — Z23 Encounter for immunization: Secondary | ICD-10-CM

## 2019-03-12 DIAGNOSIS — Z113 Encounter for screening for infections with a predominantly sexual mode of transmission: Secondary | ICD-10-CM

## 2019-03-12 DIAGNOSIS — I10 Essential (primary) hypertension: Secondary | ICD-10-CM

## 2019-03-12 DIAGNOSIS — Z79899 Other long term (current) drug therapy: Secondary | ICD-10-CM

## 2019-03-12 DIAGNOSIS — K219 Gastro-esophageal reflux disease without esophagitis: Secondary | ICD-10-CM

## 2019-03-12 DIAGNOSIS — F411 Generalized anxiety disorder: Secondary | ICD-10-CM

## 2019-03-12 DIAGNOSIS — J4520 Mild intermittent asthma, uncomplicated: Secondary | ICD-10-CM

## 2019-03-12 MED ORDER — CHLORTHALIDONE 50 MG PO TABS: 50.0000 mg | ORAL_TABLET | Freq: Every day | ORAL | 1 refills | Status: DC

## 2019-03-12 MED ORDER — OMEPRAZOLE 20 MG PO CPDR: 20.0000 mg | DELAYED_RELEASE_CAPSULE | Freq: Every day | ORAL | 3 refills | Status: DC

## 2019-03-12 MED ORDER — LISINOPRIL 40 MG PO TABS: 40.0000 mg | ORAL_TABLET | Freq: Every day | ORAL | 1 refills | Status: DC

## 2019-03-12 MED ORDER — DICYCLOMINE HCL 10 MG PO CAPS: 10.0000 mg | ORAL_CAPSULE | Freq: Three times a day (TID) | ORAL | 5 refills | Status: DC | PRN

## 2019-03-12 MED ORDER — METOPROLOL TARTRATE 50 MG PO TABS: ORAL_TABLET | ORAL | 1 refills | Status: DC

## 2019-03-12 MED ORDER — ESCITALOPRAM OXALATE 20 MG PO TABS: 20.0000 mg | ORAL_TABLET | Freq: Every day | ORAL | 1 refills | Status: DC

## 2019-03-12 MED ORDER — ALBUTEROL SULFATE HFA 108 (90 BASE) MCG/ACT IN AERS: 2.0000 | INHALATION_SPRAY | Freq: Four times a day (QID) | RESPIRATORY_TRACT | 5 refills | Status: DC | PRN

## 2019-03-12 NOTE — Progress Notes (Signed)
Patient presents for vaccination against influenza per orders of Dr. Fulp. Consent given. Counseling provided. No contraindications exists. Vaccine administered without incident.   

## 2019-03-12 NOTE — Progress Notes (Signed)
Established Patient Office Visit  Subjective:  Patient ID: Theresa Pearson, female    DOB: 06-Oct-1965  Age: 53 y.o. MRN: [id number]  CC:  Chief Complaint  Patient presents with  . Blood Pressure Check    HPI [name] presents for follow-up of chronic medical issues and for continued medical management of asthma, hypertension, generalized anxiety disorder, GERD as well as complaint of abdominal cramping, changes in bowel habits as well as need for testing for sexually transmitted infections.  Patient reports that her asthma is well controlled.  She does not have any avoidance of activities due to the fear of asthma exacerbations.  Her asthma symptoms tend to be triggered if she also has a cold/respiratory infection.  She also has mild increase symptoms with changes in the weather such as going from warm weather to very cold weather.  She denies any issues with shortness of breath or wheezing and no nighttime awakening secondary to shortness of breath, wheezing or cough.  She would like to have a refill of her albuterol inhaler and she agrees to have influenza immunization at today's visit        Patient has been taking her blood pressure medications and she feels that her blood pressure has been controlled.  She has had no headaches or dizziness related to her blood pressure.  Patient additionally requests refill of omeprazole to help with acid reflux as this medication has been controlling her symptoms.  She reports that she has had some recent increase in abdominal cramping and bowel movements are not as regular as in the past which has been occurring every few months.  She has had this issue in the past and her symptoms are helped with the use of Bentyl and she would like to have a refill of this medication.  She additionally needs refill of Lexapro as she continues to have issues with generalized anxiety.  She reports that the medication works well and she denies any issues with suicidal  thoughts or ideations.  She also would like to have testing for sexually transmitted infections due to recent sexual encounter with a new partner.  Past Medical History:  Diagnosis Date  . Anemia   . Anxiety   . Arthritis   . Asthma   . Chronic headaches   . Depression   . Diverticulosis   . Fibromyalgia   . Gallstones   . GERD (gastroesophageal reflux disease)   . Hypertension   . Pneumonia   . Right leg DVT ([place])   . Sleep apnea     Past Surgical History:  Procedure Laterality Date  . ABDOMINAL HYSTERECTOMY  2008   for  fibroids   . CESAREAN SECTION    . CHOLECYSTECTOMY      Family History  Problem Relation Age of Onset  . CAD Mother   . Diabetes Mellitus II Mother   . Stroke Mother   . Colon polyps Mother   . Crohn's disease Mother   . Allergy (severe) Sister   . Colon polyps Sister   . Allergy (severe) Son   . Prostate cancer Maternal Grandfather   . Diabetes Maternal Grandmother   . Hypertension Maternal Grandmother   . Heart attack Maternal Grandmother     Social History   Socioeconomic History  . Marital status: Divorced    Spouse name: Not on file  . Number of children: 2  . Years of education: Not on file  . Highest education level: Not on file  Occupational  History  . Occupation: truck Animator Needs  . Financial resource strain: Not on file  . Food insecurity    Worry: Not on file    Inability: Not on file  . Transportation needs    Medical: Not on file    Non-medical: Not on file  Tobacco Use  . Smoking status: Never Smoker  . Smokeless tobacco: Never Used  Substance and Sexual Activity  . Alcohol use: Yes    Alcohol/week: 1.0 - 2.0 standard drinks    Types: 1 - 2 Glasses of wine per week    Comment: occoasional.  . Drug use: No  . Sexual activity: Yes  Lifestyle  . Physical activity    Days per week: Not on file    Minutes per session: Not on file  . Stress: Not on file  Relationships  . Social Herbalist on  phone: Not on file    Gets together: Not on file    Attends religious service: Not on file    Active member of club or organization: Not on file    Attends meetings of clubs or organizations: Not on file    Relationship status: Not on file  . Intimate partner violence    Fear of current or ex partner: Not on file    Emotionally abused: Not on file    Physically abused: Not on file    Forced sexual activity: Not on file  Other Topics Concern  . Not on file  Social History Narrative  . Not on file    Outpatient Medications Prior to Visit  Medication Sig Dispense Refill  . azithromycin (ZITHROMAX) 250 MG tablet Two pills on the first day then one daily for 4 days 6 tablet 0  . diclofenac sodium (VOLTAREN) 1 % GEL Apply 2 g topically 4 (four) times daily. 100 g 0  . fluticasone (FLONASE) 50 MCG/ACT nasal spray Place 2 sprays into both nostrils daily. (Patient not taking: Reported on 08/06/2017) 9.9 g 2  . hydrOXYzine (ATARAX/VISTARIL) 50 MG tablet Take 0.5-1 tablets (25-50 mg total) by mouth 3 (three) times daily as needed. (Patient not taking: Reported on 08/06/2017) 30 tablet 2  . Multiple Vitamins-Minerals (MULTIVITAMIN PO) Take 1 tablet by mouth daily.    . naproxen (NAPROSYN) 500 MG tablet Take twice a day for seven days then as needed (Patient not taking: Reported on 09/02/2016) 60 tablet 1  . potassium chloride SA (K-DUR,KLOR-CON) 20 MEQ tablet Take 1 tablet (20 mEq total) by mouth 2 (two) times daily. 60 tablet 3  . albuterol (PROVENTIL HFA;VENTOLIN HFA) 108 (90 Base) MCG/ACT inhaler Inhale 2 puffs into the lungs every 6 (six) hours as needed for wheezing or shortness of breath. 1 Inhaler 11  . chlorthalidone (HYGROTON) 50 MG tablet Take 1 tablet (50 mg total) by mouth daily. Office visit needed 30 tablet 0  . dicyclomine (BENTYL) 10 MG capsule Take 1-2 capsules (10-20 mg total) by mouth every 8 (eight) hours as needed for spasms. 90 capsule 3  . escitalopram (LEXAPRO) 20 MG tablet Take  1 tablet (20 mg total) by mouth daily. 30 tablet 2  . lisinopril (PRINIVIL,ZESTRIL) 40 MG tablet Take 1 tablet (40 mg total) by mouth daily. 90 tablet 1  . metoprolol tartrate (LOPRESSOR) 50 MG tablet TAKE 1 TABLET(50 MG) BY MOUTH TWICE DAILY 180 tablet 0  . omeprazole (PRILOSEC) 20 MG capsule TAKE 1 CAPSULE(20 MG) BY MOUTH DAILY 30 capsule 2   No facility-administered  medications prior to visit.     Allergies  Allergen Reactions  . Penicillins Shortness Of Breath and Rash    ROS Review of Systems  Constitutional: Negative for chills and fever.  HENT: Positive for congestion (occasional) and rhinorrhea (ocaasional due to allergic rhinitis). Negative for sore throat and trouble swallowing.   Eyes: Negative for photophobia and visual disturbance.  Respiratory: Negative for cough, shortness of breath and wheezing.   Cardiovascular: Negative for chest pain, palpitations and leg swelling.  Gastrointestinal: Negative for abdominal pain, blood in stool, diarrhea and nausea.  Endocrine: Negative for cold intolerance, heat intolerance, polydipsia, polyphagia and polyuria.  Genitourinary: Negative for dysuria and frequency.  Musculoskeletal: Negative.  Negative for back pain and gait problem.  Skin: Negative for rash and wound.  Neurological: Negative for dizziness and headaches.  Hematological: Negative for adenopathy. Does not bruise/bleed easily.  Psychiatric/Behavioral: Negative for self-injury and suicidal ideas. The patient is nervous/anxious (controlled with medication).       Objective:    Physical Exam  Constitutional: She is oriented to person, place, and time. She appears well-developed and well-nourished.  Obese older female in NAD wearing a facial mask as per office [id number] policy  Neck: Normal range of motion. Neck supple. No JVD present.  Pulmonary/Chest: Effort normal and breath sounds normal. No respiratory distress. She has no wheezes.  Abdominal: Soft. There is no  abdominal tenderness. There is no rebound and no guarding.  Truncal obesity  Musculoskeletal:        General: No tenderness or edema.  Lymphadenopathy:    She has no cervical adenopathy.  Neurological: She is alert and oriented to person, place, and time.  Psychiatric: She has a normal mood and affect. Her behavior is normal.  Nursing note and vitals reviewed.   BP 134/83 (BP Location: Left Arm, Patient Position: Sitting, Cuff Size: Normal)   Pulse 79   Temp 98.9 F (37.2 C) (Oral)   Resp 18   Ht 5\' 5"  (1.651 m)   Wt 261 lb (118.4 kg)   SpO2 99%   BMI 43.43 kg/m  Wt Readings from Last 3 Encounters:  03/12/19 261 lb (118.4 kg)  06/16/18 272 lb (123.4 kg)  08/06/17 279 lb 12.8 oz (126.9 kg)     Health Maintenance Due  Topic Date Due  . MAMMOGRAM  04/02/2017  . COLONOSCOPY  04/02/2017    Lab Results  Component Value Date   TSH 2.420 08/06/2017   Lab Results  Component Value Date   WBC 10.5 08/06/2017   HGB 12.0 08/06/2017   HCT 37.5 08/06/2017   MCV 71 (L) 08/06/2017   PLT 444 (H) 08/06/2017   Lab Results  Component Value Date   NA 145 (H) 03/12/2019   K 3.3 (L) 03/12/2019   CO2 27 03/12/2019   GLUCOSE 79 03/12/2019   BUN 15 03/12/2019   CREATININE 0.71 03/12/2019   BILITOT <0.2 08/06/2017   ALKPHOS 88 08/06/2017   AST 13 08/06/2017   ALT 13 08/06/2017   PROT 7.5 08/06/2017   ALBUMIN 4.0 08/06/2017   CALCIUM 9.4 03/12/2019   ANIONGAP 10 08/06/2016   Lab Results  Component Value Date   CHOL 182 06/16/2018   Lab Results  Component Value Date   HDL 39 (L) 06/16/2018   Lab Results  Component Value Date   LDLCALC 121 (H) 06/16/2018   Lab Results  Component Value Date   TRIG 110 06/16/2018   Lab Results  Component Value Date  CHOLHDL 4.7 (H) 06/16/2018   Lab Results  Component Value Date   HGBA1C 5.7 09/18/2015      Assessment & Plan:  1. Mild intermittent asthma without complication Patient reports that her asthma is currently  controlled.  Patient given refill of albuterol to use as needed.  Clinical pharmacist will administer influenza immunization while patient is here at the office. - albuterol (VENTOLIN HFA) 108 (90 Base) MCG/ACT inhaler; Inhale 2 puffs into the lungs every 6 (six) hours as needed for wheezing or shortness of breath.  Dispense: 18 g; Refill: 5  2. Essential hypertension Blood pressure stable and reasonably controlled as it is near goal of 130/80 or less.  Refills provided of chlorthalidone, lisinopril and metoprolol.  Low-sodium diet and regular exercise encouraged - chlorthalidone (HYGROTON) 50 MG tablet; Take 1 tablet (50 mg total) by mouth daily.  Dispense: 90 tablet; Refill: 1 - lisinopril (ZESTRIL) 40 MG tablet; Take 1 tablet (40 mg total) by mouth daily.  Dispense: 90 tablet; Refill: 1 - metoprolol tartrate (LOPRESSOR) 50 MG tablet; TAKE 1 TABLET(50 MG) BY MOUTH TWICE DAILY  Dispense: 180 tablet; Refill: 1 - Basic Metabolic Panel  3. Gastroesophageal reflux disease, esophagitis presence not specified Prescription refill for omeprazole 20 mg daily.  Avoid known trigger foods and avoid late night eating. - omeprazole (PRILOSEC) 20 MG capsule; Take 1 capsule (20 mg total) by mouth daily.  Dispense: 90 capsule; Refill: 3  4. GAD (generalized anxiety disorder) Prescription refill provided for Lexapro 20 mg which patient has take with good results. GAD is controlled stable/controlled.   She will also have BMP in follow-up of medication use. - escitalopram (LEXAPRO) 20 MG tablet; Take 1 tablet (20 mg total) by mouth daily.  Dispense: 90 tablet; Refill: 1 - Basic Metabolic Panel  5. Change in bowel habits Patient with complaint of some abdominal cramping/change in bowel habits.  Discussed the need for colonoscopy which patient will consider.  Prescription provided for Bentyl to see if this helps with the abdominal spasms as patient states that this helped when she had similar symptoms in the past. -  dicyclomine (BENTYL) 10 MG capsule; Take 1-2 capsules (10-20 mg total) by mouth every 8 (eight) hours as needed for spasms.  Dispense: 90 capsule; Refill: 5  6. Screening examination for sexually transmitted disease Patient request testing for sexually transmitted diseases.  Patient will perform cervicovaginal self swabbing that will be checked for gonorrhea, chlamydia, bacterial vaginosis and yeast infection.  She will additionally have blood work done to check for syphilis and HIV.  She will be notified of the results and if any further treatment is needed based on the results and she is also encouraged to continue safe sexual practices. - RPR - HIV antibody (with reflex) - Cervicovaginal ancillary only  7. Encounter for long-term current use of medication Patient will have BMP at today's visit in follow-up of long-term use of multiple medications for treatment of asthma, hypertension and anxiety. - Basic Metabolic Panel   An After Visit Summary was printed and given to the patient.  Follow-up: Return for chronic issues: 4-6 months and as needed.   Antony Blackbird, MD

## 2019-03-13 LAB — BASIC METABOLIC PANEL WITH GFR
BUN/Creatinine Ratio: 21 (ref 9–23)
BUN: 15 mg/dL (ref 6–24)
CO2: 27 mmol/L (ref 20–29)
Calcium: 9.4 mg/dL (ref 8.7–10.2)
Chloride: 103 mmol/L (ref 96–106)
Creatinine, Ser: 0.71 mg/dL (ref 0.57–1.00)
GFR calc Af Amer: 114 mL/min/1.73
GFR calc non Af Amer: 99 mL/min/1.73
Glucose: 79 mg/dL (ref 65–99)
Potassium: 3.3 mmol/L — ABNORMAL LOW (ref 3.5–5.2)
Sodium: 145 mmol/L — ABNORMAL HIGH (ref 134–144)

## 2019-03-13 LAB — SYPHILIS: RPR W/REFLEX TO RPR TITER AND TREPONEMAL ANTIBODIES, TRADITIONAL SCREENING AND DIAGNOSIS ALGORITHM: RPR Ser Ql: NONREACTIVE

## 2019-03-13 LAB — HIV ANTIBODY (ROUTINE TESTING W REFLEX): HIV Screen 4th Generation wRfx: NONREACTIVE

## 2019-03-17 LAB — CERVICOVAGINAL ANCILLARY ONLY
Chlamydia: NEGATIVE
Neisseria Gonorrhea: NEGATIVE
Trichomonas: NEGATIVE

## 2019-03-18 ENCOUNTER — Other Ambulatory Visit: Payer: Self-pay | Admitting: Family Medicine

## 2019-03-18 DIAGNOSIS — N760 Acute vaginitis: Secondary | ICD-10-CM

## 2019-03-18 DIAGNOSIS — B9689 Other specified bacterial agents as the cause of diseases classified elsewhere: Secondary | ICD-10-CM

## 2019-03-18 MED ORDER — METRONIDAZOLE 500 MG PO TABS: 500.0000 mg | ORAL_TABLET | Freq: Two times a day (BID) | ORAL | 0 refills | Status: DC

## 2019-03-18 NOTE — Progress Notes (Signed)
Patient ID: Theresa Pearson, female   DOB: September 22, 1965, 53 y.o.   MRN: [id number]   Patient with urine cytology findings consistent with bacterial vaginitis and RX will be sent into her pharmacy for metronidazole

## 2019-03-19 MED FILL — metroNIDAZOLE 500 MG TABS: 500 | 7 days supply | Qty: 14 | Fill #0

## 2019-10-01 ENCOUNTER — Encounter (INDEPENDENT_AMBULATORY_CARE_PROVIDER_SITE_OTHER): Payer: Self-pay

## 2019-10-02 ENCOUNTER — Ambulatory Visit (INDEPENDENT_AMBULATORY_CARE_PROVIDER_SITE_OTHER): Payer: Commercial Managed Care - POS

## 2019-10-02 DIAGNOSIS — Z23 Encounter for immunization: Secondary | ICD-10-CM

## 2019-10-14 ENCOUNTER — Ambulatory Visit: Payer: Self-pay | Admitting: Pediatrics

## 2019-10-23 ENCOUNTER — Encounter (INDEPENDENT_AMBULATORY_CARE_PROVIDER_SITE_OTHER): Payer: Self-pay

## 2019-10-27 ENCOUNTER — Ambulatory Visit (INDEPENDENT_AMBULATORY_CARE_PROVIDER_SITE_OTHER): Payer: Commercial Managed Care - POS

## 2019-10-27 DIAGNOSIS — Z23 Encounter for immunization: Secondary | ICD-10-CM

## 2019-10-29 ENCOUNTER — Ambulatory Visit: Payer: Self-pay | Admitting: Family Medicine

## 2019-11-15 ENCOUNTER — Other Ambulatory Visit: Payer: Self-pay | Admitting: Family Medicine

## 2019-11-15 DIAGNOSIS — I10 Essential (primary) hypertension: Secondary | ICD-10-CM

## 2019-12-01 ENCOUNTER — Ambulatory Visit (INDEPENDENT_AMBULATORY_CARE_PROVIDER_SITE_OTHER): Payer: Commercial Managed Care - POS | Admitting: Obstetrics & Gynecology

## 2019-12-01 ENCOUNTER — Encounter (INDEPENDENT_AMBULATORY_CARE_PROVIDER_SITE_OTHER): Payer: Self-pay | Admitting: Obstetrics & Gynecology

## 2019-12-01 VITALS — BP 112/78 | Ht 64.0 in | Wt 131.0 lb

## 2019-12-01 DIAGNOSIS — Z1239 Encounter for other screening for malignant neoplasm of breast: Secondary | ICD-10-CM

## 2019-12-01 DIAGNOSIS — N83202 Unspecified ovarian cyst, left side: Secondary | ICD-10-CM

## 2019-12-01 DIAGNOSIS — Z1212 Encounter for screening for malignant neoplasm of rectum: Secondary | ICD-10-CM

## 2019-12-01 DIAGNOSIS — Z01419 Encounter for gynecological examination (general) (routine) without abnormal findings: Secondary | ICD-10-CM

## 2019-12-01 DIAGNOSIS — Z1231 Encounter for screening mammogram for malignant neoplasm of breast: Secondary | ICD-10-CM

## 2019-12-01 LAB — POCT OCCULT BLOOD STOOL: Stool Occult Blood: NEGATIVE

## 2019-12-01 LAB — POCT UA URISTIX
Glucose, UA POCT: NEGATIVE mg/dL
POCT Protein, UA: NEGATIVE mg/dL

## 2019-12-01 NOTE — Progress Notes (Signed)
Subjective:       Elizabeth Dougherty is a 54 y.o. female here for a routine exam.  Current complaints: none.  Personal health questionnaire reviewed: yes. Pt denies any VB, d/c, dysuria, or dyspareunia. Nl BM's. No incontinence. Older daughter is in 11th grade, younger daughter in 35th, and son is in 24th. Pt did not do a mammogram last year or the pelvic sono to f/u a 6 cm left adnexal cyst.     Gynecologic History  Patient's last menstrual period was 11/12/2017.  Contraception: vasectomy  Breast self exam: discussed  Common GYN tests  Last Pap Date: 11/27/18  Last Pap Result: Normal  Last Mammo Date: 06/03/18  Last Mammo Result: Normal    The following portions of the patient's history were reviewed and updated as appropriate: allergies, current medications, past family history, past medical history, past social history, past surgical history and problem list.      Review of Systems  Pertinent items are noted in HPI.      Objective:      BP 112/78    Ht 5\' 4"  (1.626 m)    Wt 131 lb (59.4 kg)    LMP 11/12/2017    BMI 22.49 kg/m   General appearance: alert, appears stated age and cooperative  Neck: no adenopathy, supple, symmetrical, trachea midline and thyroid not enlarged, symmetric, no tenderness/mass/nodules  Breasts: normal appearance, no masses or tenderness, No nipple retraction or dimpling, No nipple discharge or bleeding, No axillary or supraclavicular adenopathy  Abdomen: soft, non-tender; bowel sounds normal; no masses,  no organomegaly  Pelvic exam:     Urinary system: urethral meatus normal   External genitalia: normal general appearance   Vaginal: normal rugae   Cervix: normal appearance   Adnexa: normal bimanual exam   Uterus: normal single, nontender   Rectal: good sphincter tone, no masses and guaiac negative          Assessment:      Healthy female exam.        Plan:      Mammogram ordered.  Follow up in: 1 year.  Thin prep pap/HPV No secondary to nl Pap in 2020 with negative HR HPV  Pelvic sono  referral given.  Again, recommended colonoscopy vs Cologuard. Referral to [facility] given.

## 2019-12-08 ENCOUNTER — Telehealth: Payer: Self-pay | Admitting: Family Medicine

## 2019-12-08 NOTE — Telephone Encounter (Signed)
Patient called in and requested to inform pcp that she has been having tingling in her right middle finger. Patient stated that it started last week but was only swelling at the tip but today it spreaded to the base of her finger. Patient stated she has no clue why it is happening. Please follow up at your earliest convenience.

## 2019-12-10 NOTE — Telephone Encounter (Signed)
Patient will need to have an appointment to follow up on this concern. If the symptoms become more widespread such as numbness/tingling of most of her arm or swelling gets worse or there is associated redness or increased warmth she should seek emergency care.   Phill Myron, D.O. Primary Care at [facility]  12/10/2019, 6:38 PM

## 2020-01-17 ENCOUNTER — Other Ambulatory Visit: Payer: Self-pay | Admitting: Family Medicine

## 2020-01-17 DIAGNOSIS — I10 Essential (primary) hypertension: Secondary | ICD-10-CM

## 2020-01-17 DIAGNOSIS — F411 Generalized anxiety disorder: Secondary | ICD-10-CM

## 2020-01-18 NOTE — Telephone Encounter (Signed)
Requested medication (s) are due for refill today:  Yes  Requested medication (s) are on the active medication list:  Yes  Future visit scheduled:  Yes  Last Refill: Escitalopram, Lisinopril, Chlorthalidone all refilled 03/12/19; #90 with RF x1. (prev. Ordered by [employer])  Notes to clinic:  phone call to Theresa Pearson.  Scheduled for Mychart Video visit on 7/21/221 with Freeman Caldron for f/u/ medication refill.  (Theresa Pearson. reported cough x 2 weeks with [facility].)   Requested Prescriptions  Pending Prescriptions Disp Refills   escitalopram (LEXAPRO) 20 MG tablet [Pharmacy Med Name: ESCITALOPRAM 20MG  TABLETS] 15 tablet 0    Sig: TAKE 1 TABLET(20 MG) BY MOUTH DAILY      Psychiatry:  Antidepressants - SSRI Failed - 01/17/2020 11:19 AM      Failed - Valid encounter within last 6 months    Recent Outpatient Visits           10 months ago Need for influenza vaccination   [place], RPH-CPP   10 months ago Screening examination for sexually transmitted disease   [place] Fulp, [place], MD   1 year ago Essential hypertension   [place], [place], MD   2 years ago Essential hypertension   [place], [place], [place]   3 years ago Essential hypertension   [place], [place], MD       Future Appointments             In 2 weeks [place], Dionne Bucy, PA-C [place]              lisinopril (ZESTRIL) 40 MG tablet [Pharmacy Med Name: LISINOPRIL 40MG  TABLETS] 15 tablet 0    Sig: TAKE 1 TABLET(40 MG) BY MOUTH DAILY      Cardiovascular:  ACE Inhibitors Failed - 01/17/2020 11:19 AM      Failed - Cr in normal range and within 180 days    Creat  Date Value Ref Range Status  06/21/2016 0.68 0.50 - 1.10 mg/dL Final   Creatinine, Ser  Date Value Ref Range Status   03/12/2019 0.71 0.57 - 1.00 mg/dL Final          Failed - K in normal range and within 180 days    Potassium  Date Value Ref Range Status  03/12/2019 3.3 (L) 3.5 - 5.2 mmol/L Final          Failed - Valid encounter within last 6 months    Recent Outpatient Visits           10 months ago Need for influenza vaccination   [place], Jarome Matin, RPH-CPP   10 months ago Screening examination for sexually transmitted disease   [place], MD   1 year ago Essential hypertension   [place], MD   2 years ago Essential hypertension   [place], [place], [place]   3 years ago Essential hypertension   [place], MD       Future Appointments             In 2 weeks [place], Dionne Bucy, PA-C [place]  Passed - Patient is not pregnant      Passed - Last BP in normal range    BP Readings from Last 1 Encounters:  03/12/19 134/83            chlorthalidone (HYGROTON) 50 MG tablet [Pharmacy Med Name: CHLORTHALIDONE 50MG  TABLETS] 15 tablet 0    Sig: TAKE 1 TABLET(50 MG) BY MOUTH DAILY      Cardiovascular: Diuretics - Thiazide Failed - 01/17/2020 11:19 AM      Failed - K in normal range and within 360 days    Potassium  Date Value Ref Range Status  03/12/2019 3.3 (L) 3.5 - 5.2 mmol/L Final          Failed - Na in normal range and within 360 days    Sodium  Date Value Ref Range Status  03/12/2019 145 (H) 134 - 144 mmol/L Final          Failed - Valid encounter within last 6 months    Recent Outpatient Visits           10 months ago Need for influenza vaccination   [place], Jarome Matin, RPH-CPP   10 months ago Screening examination for sexually transmitted disease   [place] Fulp, [place], MD   1 year ago Essential hypertension   [place], [place], MD   2 years ago Essential hypertension   [place], [place], [place]   3 years ago Essential hypertension   [place], [place], MD       Future Appointments             In 2 weeks [place], Dionne Bucy, PA-C [place] in normal range and within 360 days    Calcium  Date Value Ref Range Status  03/12/2019 9.4 8.7 - 10.2 mg/dL Final          Passed - Cr in normal range and within 360 days    Creat  Date Value Ref Range Status  06/21/2016 0.68 0.50 - 1.10 mg/dL Final   Creatinine, Ser  Date Value Ref Range Status  03/12/2019 0.71 0.57 - 1.00 mg/dL Final          Passed - Last BP in normal range    BP Readings from Last 1 Encounters:  03/12/19 134/83

## 2020-01-28 ENCOUNTER — Other Ambulatory Visit: Payer: Self-pay | Admitting: Obstetrics & Gynecology

## 2020-01-31 ENCOUNTER — Encounter (INDEPENDENT_AMBULATORY_CARE_PROVIDER_SITE_OTHER): Payer: Self-pay | Admitting: Obstetrics & Gynecology

## 2020-02-02 ENCOUNTER — Ambulatory Visit: Payer: Self-pay | Attending: Physician Assistant | Admitting: Physician Assistant

## 2020-02-02 ENCOUNTER — Other Ambulatory Visit: Payer: Self-pay | Admitting: Physician Assistant

## 2020-02-02 VITALS — BP 134/85 | HR 72 | Temp 98.1°F | Resp 16 | Ht 65.0 in | Wt 287.0 lb

## 2020-02-02 DIAGNOSIS — Z131 Encounter for screening for diabetes mellitus: Secondary | ICD-10-CM

## 2020-02-02 DIAGNOSIS — I10 Essential (primary) hypertension: Secondary | ICD-10-CM

## 2020-02-02 DIAGNOSIS — Z13220 Encounter for screening for lipoid disorders: Secondary | ICD-10-CM

## 2020-02-02 DIAGNOSIS — J4520 Mild intermittent asthma, uncomplicated: Secondary | ICD-10-CM

## 2020-02-02 DIAGNOSIS — F411 Generalized anxiety disorder: Secondary | ICD-10-CM

## 2020-02-02 DIAGNOSIS — R635 Abnormal weight gain: Secondary | ICD-10-CM

## 2020-02-02 DIAGNOSIS — K219 Gastro-esophageal reflux disease without esophagitis: Secondary | ICD-10-CM

## 2020-02-02 MED ORDER — ALBUTEROL SULFATE HFA 108 (90 BASE) MCG/ACT IN AERS: 2.0000 | INHALATION_SPRAY | Freq: Four times a day (QID) | RESPIRATORY_TRACT | 5 refills | Status: DC | PRN

## 2020-02-02 MED ORDER — METOPROLOL TARTRATE 50 MG PO TABS: ORAL_TABLET | ORAL | 1 refills | Status: DC

## 2020-02-02 MED ORDER — OMEPRAZOLE 20 MG PO CPDR: 20.0000 mg | DELAYED_RELEASE_CAPSULE | Freq: Every day | ORAL | 3 refills | Status: DC

## 2020-02-02 MED ORDER — LISINOPRIL 40 MG PO TABS: 40.0000 mg | ORAL_TABLET | Freq: Every day | ORAL | 1 refills | Status: DC

## 2020-02-02 MED ORDER — HYDROXYZINE HCL 25 MG PO TABS: ORAL_TABLET | ORAL | 1 refills | Status: DC

## 2020-02-02 MED ORDER — POTASSIUM CHLORIDE CRYS ER 20 MEQ PO TBCR: 20.0000 meq | EXTENDED_RELEASE_TABLET | Freq: Two times a day (BID) | ORAL | 3 refills | Status: DC

## 2020-02-02 MED ORDER — CHLORTHALIDONE 50 MG PO TABS: 50.0000 mg | ORAL_TABLET | Freq: Every day | ORAL | 1 refills | Status: DC

## 2020-02-02 MED ORDER — ESCITALOPRAM OXALATE 20 MG PO TABS: 20.0000 mg | ORAL_TABLET | Freq: Every day | ORAL | 1 refills | Status: DC

## 2020-02-02 MED FILL — !PROVENTIL HFA 90 MCG INH: 108 (90 BAS | 25 days supply | Qty: 7 | Fill #0

## 2020-02-02 NOTE — Progress Notes (Signed)
Theresa Pearson, is a 54 y.o. female  STM:[id number]  [id number]:[id number]  DOB - 16-Jul-1965  Subjective:  Chief Complaint and HPI: Theresa Pearson is a 54 y.o. female here today for med RF and anxiety and blood work.  She is fasting even though it is late in the afternoon.  She has been having more anxiety than usual over the last couple of months.  Still taking lexapro.  Wants something she could incorporate prn for anxiety.  Her adult sons are living with her and this is increasing her stress level.  Also just feels more anxious after being isolated with Covid.  Denies SI/HI.  Took 50mg  hydroxyzine in the past but it amde her sleepy.    ROS:   Constitutional:  No f/c, No night sweats, No unexplained weight loss. EENT:  No vision changes, No blurry vision, No hearing changes. No mouth, throat, or ear problems.  Respiratory: No cough, No SOB Cardiac: No CP, no palpitations GI:  No abd pain, No N/V/D. GU: No Urinary s/sx Musculoskeletal: No joint pain Neuro: No headache, no dizziness, no motor weakness.  Skin: No rash Endocrine:  No polydipsia. No polyuria.  Psych: Denies SI/HI  No problems updated.  ALLERGIES: Allergies  Allergen Reactions  . Penicillins Shortness Of Breath and Rash    PAST MEDICAL HISTORY: Past Medical History:  Diagnosis Date  . Anemia   . Anxiety   . Arthritis   . Asthma   . Chronic headaches   . Depression   . Diverticulosis   . Fibromyalgia   . Gallstones   . GERD (gastroesophageal reflux disease)   . Hypertension   . Pneumonia   . Right leg DVT ([place])   . Sleep apnea     MEDICATIONS AT HOME: Prior to Admission medications   Medication Sig Start Date End Date Taking? Authorizing Provider  albuterol (VENTOLIN HFA) 108 (90 Base) MCG/ACT inhaler Inhale 2 puffs into the lungs every 6 (six) hours as needed for wheezing or shortness of breath. 02/02/20   Thereasa Solo, Dionne Bucy, PA-C  chlorthalidone (HYGROTON) 50 MG tablet Take 1 tablet (50 mg total) by mouth  daily. 02/02/20   Argentina Donovan, PA-C  diclofenac sodium (VOLTAREN) 1 % GEL Apply 2 g topically 4 (four) times daily. 04/21/17   Thurman Coyer, DO  dicyclomine (BENTYL) 10 MG capsule Take 1-2 capsules (10-20 mg total) by mouth every 8 (eight) hours as needed for spasms. 03/12/19   Fulp, Cammie, MD  escitalopram (LEXAPRO) 20 MG tablet Take 1 tablet (20 mg total) by mouth daily. 02/02/20   Argentina Donovan, PA-C  hydrOXYzine (ATARAX/VISTARIL) 25 MG tablet 1/2 to 1 tid prn anxiety 02/02/20   Argentina Donovan, PA-C  lisinopril (ZESTRIL) 40 MG tablet Take 1 tablet (40 mg total) by mouth daily. 02/02/20   Argentina Donovan, PA-C  metoprolol tartrate (LOPRESSOR) 50 MG tablet TAKE 1 TABLET(50 MG) BY MOUTH TWICE DAILY 02/02/20   Argentina Donovan, PA-C  Multiple Vitamins-Minerals (MULTIVITAMIN PO) Take 1 tablet by mouth daily.    [provider]  naproxen (NAPROSYN) 500 MG tablet Take twice a day for seven days then as needed Patient not taking: Reported on 09/02/2016 01/22/16   Thurman Coyer, DO  omeprazole (PRILOSEC) 20 MG capsule Take 1 capsule (20 mg total) by mouth daily. 02/02/20   Argentina Donovan, PA-C  potassium chloride SA (KLOR-CON) 20 MEQ tablet Take 1 tablet (20 mEq total) by mouth 2 (two) times daily. 02/02/20  Argentina Donovan, PA-C     Objective:  EXAM:   Vitals:   02/02/20 1542  BP: 134/85  Pulse: 72  Resp: 16  Temp: 98.1 F (36.7 C)  SpO2: 98%  Weight: 287 lb (130.2 kg)  Height: 5\' 5"  (1.651 m)    General appearance : A&OX3. NAD. Non-toxic-appearing HEENT: Atraumatic and Normocephalic.  PERRLA. EOM intact.   Chest/Lungs:  Breathing-non-labored, Good air entry bilaterally, breath sounds normal without rales, rhonchi, or wheezing  CVS: S1 S2 regular, no murmurs, gallops, rubs  Extremities: Bilateral Lower Ext shows no edema, both legs are warm to touch with = pulse throughout Neurology:  CN II-XII grossly intact, Non focal.   Psych:  TP linear. J/I WNL.  Normal speech. Appropriate eye contact and affect.  Skin:  No Rash  Data Review Lab Results  Component Value Date   HGBA1C 5.7 09/18/2015     Assessment & Plan   1. Mild intermittent asthma without complication No wheezing today but needs RF - albuterol (VENTOLIN HFA) 108 (90 Base) MCG/ACT inhaler; Inhale 2 puffs into the lungs every 6 (six) hours as needed for wheezing or shortness of breath.  Dispense: 18 g; Refill: 5  2. Essential hypertension Controlled-continue current regimen - chlorthalidone (HYGROTON) 50 MG tablet; Take 1 tablet (50 mg total) by mouth daily.  Dispense: 90 tablet; Refill: 1 - lisinopril (ZESTRIL) 40 MG tablet; Take 1 tablet (40 mg total) by mouth daily.  Dispense: 90 tablet; Refill: 1 - metoprolol tartrate (LOPRESSOR) 50 MG tablet; TAKE 1 TABLET(50 MG) BY MOUTH TWICE DAILY  Dispense: 180 tablet; Refill: 1 - potassium chloride SA (KLOR-CON) 20 MEQ tablet; Take 1 tablet (20 mEq total) by mouth 2 (two) times daily.  Dispense: 60 tablet; Refill: 3 - Comprehensive metabolic panel - CBC with Differential/Platelet  3. GAD (generalized anxiety disorder) Will try lower dose hydroxyzine and see if that helps without somnolence.  Consider buspar if she instead feels something daily is needed and I will have her f/up with Christa See, LCSW - escitalopram (LEXAPRO) 20 MG tablet; Take 1 tablet (20 mg total) by mouth daily.  Dispense: 90 tablet; Refill: 1 - hydrOXYzine (ATARAX/VISTARIL) 25 MG tablet; 1/2 to 1 tid prn anxiety  Dispense: 60 tablet; Refill: 1 - Ambulatory referral to Social Work - Vitamin D, 25-hydroxy  4. Gastroesophageal reflux disease, unspecified whether esophagitis present - omeprazole (PRILOSEC) 20 MG capsule; Take 1 capsule (20 mg total) by mouth daily.  Dispense: 90 capsule; Refill: 3 - Comprehensive metabolic panel  5. Screening cholesterol level - Lipid panel  6. Screening for diabetes mellitus I have had a lengthy discussion and provided  education about insulin resistance and the intake of too much sugar/refined carbohydrates.  I have advised the patient to work at a goal of eliminating sugary drinks, candy, desserts, sweets, refined sugars, processed foods, and white carbohydrates.  The patient expresses understanding.  - Hemoglobin A1c  7. Weight gain - Vitamin D, 25-hydroxy - TSH    Patient have been counseled extensively about nutrition and exercise  Return in about 6 months (around 08/04/2020) for PCP;  chronic conditions/sooner if anxiety does not improve.  The patient was given clear instructions to go to ER or return to medical center if symptoms don't improve, worsen or new problems develop. The patient verbalized understanding. The patient was told to call to get lab results if they haven't heard anything in the next week.     Freeman Caldron, PA-C [facility]  and [place], [place]   02/02/2020, 4:07 PMPatient ID: Theresa Pearson, female   DOB: 1966-04-12, 54 y.o.   MRN: [id number]

## 2020-02-02 NOTE — Progress Notes (Signed)
Here for BP meds refills  Pt states dealing with depression and anxiety  Past 2 months but don't have a reason

## 2020-02-03 LAB — CBC WITH DIFFERENTIAL/PLATELET
Basophils Absolute: 0 10*3/uL (ref 0.0–0.2)
Basos: 0 %
EOS (ABSOLUTE): 0.1 10*3/uL (ref 0.0–0.4)
Eos: 1 %
Hematocrit: 37.2 % (ref 34.0–46.6)
Hemoglobin: 11.4 g/dL (ref 11.1–15.9)
Immature Grans (Abs): 0.1 10*3/uL (ref 0.0–0.1)
Immature Granulocytes: 1 %
Lymphocytes Absolute: 2.8 10*3/uL (ref 0.7–3.1)
Lymphs: 27 %
MCH: 22.3 pg — ABNORMAL LOW (ref 26.6–33.0)
MCHC: 30.6 g/dL — ABNORMAL LOW (ref 31.5–35.7)
MCV: 73 fL — ABNORMAL LOW (ref 79–97)
Monocytes Absolute: 0.4 10*3/uL (ref 0.1–0.9)
Monocytes: 4 %
Neutrophils Absolute: 7.1 10*3/uL — ABNORMAL HIGH (ref 1.4–7.0)
Neutrophils: 67 %
Platelets: 413 10*3/uL (ref 150–450)
RBC: 5.11 x10E6/uL (ref 3.77–5.28)
RDW: 17.5 % — ABNORMAL HIGH (ref 11.7–15.4)
WBC: 10.5 10*3/uL (ref 3.4–10.8)

## 2020-02-03 LAB — COMPREHENSIVE METABOLIC PANEL
ALT: 8 IU/L (ref 0–32)
AST: 12 IU/L (ref 0–40)
Albumin/Globulin Ratio: 1.2 (ref 1.2–2.2)
Albumin: 3.8 g/dL (ref 3.8–4.9)
Alkaline Phosphatase: 93 IU/L (ref 48–121)
BUN/Creatinine Ratio: 21 (ref 9–23)
BUN: 15 mg/dL (ref 6–24)
Bilirubin Total: 0.3 mg/dL (ref 0.0–1.2)
CO2: 28 mmol/L (ref 20–29)
Calcium: 9.3 mg/dL (ref 8.7–10.2)
Chloride: 99 mmol/L (ref 96–106)
Creatinine, Ser: 0.7 mg/dL (ref 0.57–1.00)
GFR calc Af Amer: 115 mL/min/{1.73_m2} (ref >59)
GFR calc non Af Amer: 100 mL/min/{1.73_m2} (ref >59)
Globulin, Total: 3.3 g/dL (ref 1.5–4.5)
Glucose: 72 mg/dL (ref 65–99)
Potassium: 3.7 mmol/L (ref 3.5–5.2)
Sodium: 140 mmol/L (ref 134–144)
Total Protein: 7.1 g/dL (ref 6.0–8.5)

## 2020-02-03 LAB — LIPID PANEL
Chol/HDL Ratio: 5.2 ratio — ABNORMAL HIGH (ref 0.0–4.4)
Cholesterol, Total: 191 mg/dL (ref 100–199)
HDL: 37 mg/dL — ABNORMAL LOW (ref >39)
LDL Chol Calc (NIH): 129 mg/dL — ABNORMAL HIGH (ref 0–99)
Triglycerides: 140 mg/dL (ref 0–149)
VLDL Cholesterol Cal: 25 mg/dL (ref 5–40)

## 2020-02-03 LAB — HEMOGLOBIN A1C
Est. average glucose Bld gHb Est-mCnc: 123 mg/dL
Hgb A1c MFr Bld: 5.9 % — ABNORMAL HIGH (ref 4.8–5.6)

## 2020-02-03 LAB — TSH: TSH: 1.99 u[IU]/mL (ref 0.450–4.500)

## 2020-02-03 LAB — VITAMIN D 25 HYDROXY (VIT D DEFICIENCY, FRACTURES): Vit D, 25-Hydroxy: 32.8 ng/mL (ref 30.0–100.0)

## 2020-02-03 MED FILL — hydrOXYzine HCL 25 MG TABS: 25 | 20 days supply | Qty: 60 | Fill #0

## 2020-02-03 MED FILL — OMEPRAZOLE 20 MG CAP: 20 | 30 days supply | Qty: 30 | Fill #0

## 2020-02-03 MED FILL — CHLORTHALIDONE 50 MG TABLET: 50 | 30 days supply | Qty: 30 | Fill #0

## 2020-02-03 MED FILL — ESCITALOPRAM 20 MG TABLET: 20 | 30 days supply | Qty: 30 | Fill #0

## 2020-02-03 MED FILL — POTASSIUM CL ER 20 MEQ TABL: 20 | 30 days supply | Qty: 60 | Fill #0

## 2020-02-03 MED FILL — LISINOPRIL 40 MG TABLET: 40 | 30 days supply | Qty: 30 | Fill #0

## 2020-02-03 MED FILL — METOPROLOL TARTRATE 50 MG T: 50 | 30 days supply | Qty: 60 | Fill #0

## 2020-02-10 ENCOUNTER — Encounter (INDEPENDENT_AMBULATORY_CARE_PROVIDER_SITE_OTHER): Payer: Self-pay | Admitting: Obstetrics & Gynecology

## 2020-02-10 ENCOUNTER — Other Ambulatory Visit: Payer: Self-pay | Admitting: Obstetrics & Gynecology

## 2020-02-11 NOTE — Progress Notes (Signed)
Spoke with patient regarding repeat pelvic sono. Left ovarian cysts appear stable.

## 2020-03-01 ENCOUNTER — Ambulatory Visit: Payer: Self-pay | Attending: Family Medicine | Admitting: Licensed Clinical Social Worker

## 2020-03-01 DIAGNOSIS — F331 Major depressive disorder, recurrent, moderate: Secondary | ICD-10-CM

## 2020-03-01 DIAGNOSIS — F411 Generalized anxiety disorder: Secondary | ICD-10-CM

## 2020-03-01 NOTE — BH Specialist Note (Signed)
Integrated Behavioral Health Visit via Telemedicine (Telephone)  03/01/2020 Theresa Pearson [id number]   Session Start time: 9:10 AM  Session End time: 9:35 PM Total time: 25 minutes  Referring Provider: Weyman Pedro Type of Visit: Telephonic Patient location: [facility] Provider location: Office All persons participating in visit: LCSW and Patient   Discussed confidentiality: Yes   "By engaging in this telephone visit, you consent to the provision of healthcare.  Additionally, you authorize for your insurance to be billed for the services provided during this telephone visit."   Patient and/or legal guardian consented to telephone visit: Yes   PRESENTING CONCERNS: Patient and/or family reports the following symptoms/concerns: Diagnosed with depression and anxiety. Pt is participating in medication management through PCP. Has been having difficulty obtaining meds. Had a bad episode of depression for approximately two months triggered by stress regarding children and very limited support system Duration of problem: Long ago"; Severity of problem: moderate  STRENGTHS (Protective Factors/Coping Skills): Pt has good insight Pt is participating in medication management through PCP Pt has desire for change  GOALS ADDRESSED: Patient will: 1.  Reduce symptoms of: anxiety, depression and stress  2.  Increase knowledge and/or ability of: coping skills and healthy habits  3.  Demonstrate ability to: Increase healthy adjustment to current life circumstances, Increase adequate support systems for patient/family and Improve medication compliance  INTERVENTIONS: Interventions utilized:  Solution-Focused Strategies, Supportive Counseling and Psychoeducation and/or Health Education Standardized Assessments completed: Not Needed  ASSESSMENT: Patient currently experiencing symptoms of depression and anxiety triggered by psychosocial stressors.   Patient may benefit from therapy and continued  medication management. LCSW discussed correlation between one's physical and mental health, in addition, to how stress can negatively impact both. Healthy coping skills identified.  PLAN: 1. Follow up with behavioral health clinician on : 03/13/2020 2. Behavioral recommendations: Utilize strategies discussed and comply with medications 3. Referral(s): [place] (In Clinic)  [place] [facility] [place]   Confirmed patient's address: Yes  Confirmed patient's phone number: Yes  Any changes to demographics: No   Confirmed patient's insurance: Yes  Any changes to patient's insurance: No    The following statements were read to the patient and/or legal guardian that are established with the [facility] Provider.  "The purpose of this phone visit is to provide behavioral health care while limiting exposure to the coronavirus (COVID19).  There is a possibility of technology failure and discussed alternative modes of communication if that failure occurs."

## 2020-03-07 MED FILL — CHLORTHALIDONE 50 MG TABLET: 50 | 30 days supply | Qty: 30 | Fill #0

## 2020-03-07 MED FILL — hydrOXYzine HCL 25 MG TABS: 25 | 20 days supply | Qty: 60 | Fill #0

## 2020-03-07 MED FILL — ESCITALOPRAM 20 MG TABLET: 20 | 30 days supply | Qty: 30 | Fill #0

## 2020-03-07 MED FILL — LISINOPRIL 40 MG TABLET: 40 | 30 days supply | Qty: 30 | Fill #0

## 2020-03-13 ENCOUNTER — Encounter: Payer: Self-pay | Admitting: Licensed Clinical Social Worker

## 2020-03-15 ENCOUNTER — Ambulatory Visit: Payer: Self-pay | Attending: Family Medicine | Admitting: Licensed Clinical Social Worker

## 2020-03-15 ENCOUNTER — Other Ambulatory Visit: Payer: Self-pay

## 2020-03-15 DIAGNOSIS — F411 Generalized anxiety disorder: Secondary | ICD-10-CM

## 2020-03-15 NOTE — BH Specialist Note (Signed)
Follow up call placed to patient. Patient reports an improvement in the management of anxiety symptoms. She feels working outside of the home has been effective in decreasing triggers, in addition, to establishing healthy boundaries with family.   LCSW discussed benefits of [facility] to assist with adult son. LCSW mailed information on [place] to address on file, per pt request. No additional concerns noted.   LCSW encouraged pt to contact LCSW with any additional behavioral health and/or resource needs.

## 2020-04-27 ENCOUNTER — Ambulatory Visit (INDEPENDENT_AMBULATORY_CARE_PROVIDER_SITE_OTHER): Payer: Commercial Managed Care - POS | Admitting: Sports Medicine

## 2020-04-27 ENCOUNTER — Encounter (INDEPENDENT_AMBULATORY_CARE_PROVIDER_SITE_OTHER): Payer: Self-pay | Admitting: Sports Medicine

## 2020-04-27 ENCOUNTER — Ambulatory Visit (INDEPENDENT_AMBULATORY_CARE_PROVIDER_SITE_OTHER): Payer: Commercial Managed Care - POS

## 2020-04-27 VITALS — BP 105/69 | HR 77

## 2020-04-27 DIAGNOSIS — M25561 Pain in right knee: Secondary | ICD-10-CM

## 2020-04-27 MED FILL — METOPROLOL TARTRATE 50 MG T: 50 | 30 days supply | Qty: 60 | Fill #0

## 2020-04-27 MED FILL — ESCITALOPRAM 20 MG TABLET: 20 | 30 days supply | Qty: 30 | Fill #1

## 2020-04-27 MED FILL — POTASSIUM CL ER 20 MEQ TAB: 20 | 30 days supply | Qty: 60 | Fill #0

## 2020-04-27 MED FILL — hydrOXYzine HCL 25 MG TABS: 25 | 20 days supply | Qty: 60 | Fill #1

## 2020-04-27 MED FILL — LISINOPRIL 40 MG TABLET: 40 | 30 days supply | Qty: 30 | Fill #1

## 2020-04-27 MED FILL — CHLORTHALIDONE 50 MG TABLET: 50 | 30 days supply | Qty: 30 | Fill #1

## 2020-04-27 NOTE — Progress Notes (Signed)
[facility]opa[facility]  Elizabeth Gerads A. Elizabeth Simpson, DO    Date of Exam:  04/27/2020   Patient:  Elizabeth Dougherty  DOB:  09-19-1965    AGE:  54 y.o.  MR#:  [id number]     Chief Complaint: Right knee pain.    HPI:  Elizabeth Dougherty is a pleasant 54 y.o.-year-old female who presents today with a history of right knee pain that she has had for the last 3 weeks, no injury. She localizes the pain to the anterior knee mostly with lots of popping. She reports her symptoms are made worse with stairs.  She denies swelling and stiffness post activities.  She denies mechanical symptoms such as catching or locking.  Elizabeth Dougherty denies a sense of instability.  No pain meds.  No PT yet.  Elizabeth Dougherty is now here for further evaluation and discussion of treatment options.    For other past medical history, social history, family history and past surgical history please see them listed below.    Problem List: There is no problem list on file for this patient.       Past Medical History:    Past Medical History:   Diagnosis Date    Abdominal hernia 2008    Abnormal Pap smear     Genital warts     Hemorrhoids without complication        Social History:   Social History     Tobacco Use    Smoking status: Never Smoker    Smokeless tobacco: Never Used   Substance Use Topics    Alcohol use: Yes     Alcohol/week: 1.0 standard drinks     Types: 1 Glasses of wine per week     Comment: occasional    Drug use: Never       Family History: History reviewed. No pertinent family history.    Past Surgical History:    Past Surgical History:   Procedure Laterality Date    BREAST BIOPSY      CERVICAL BIOPSY  W/ LOOP ELECTRODE EXCISION         Medications:      Current Outpatient Medications:     Multiple Vitamin (MULTI-VITAMIN DAILY PO), Multi Vitamin  1 po qd, Disp: , Rfl:        Allergies:  No Known Allergies    ROS:  Constitutional: No fatigue, fever, weight loss, or weight gain.   Ears, Nose, Mouth & Throat: No sore throat or  hearing loss.   Cardiovascular: No chest pain, blood clots, or leg cramps.   Respiratory: No shortness of breath, cough, or difficulty breathing.   Gastrointestinal: No nausea, vomiting, diarrhea, or loss of appetite.   Genitourinary: No polyuria or kidney disease.   Musculoskeletal: No joint aches, muscle weakness or swelling of joints/body parts other than that mentioned above/below.  Integumentary: No finger nail changes or skin dryness.   Neurological: No numbness, burning discomfort, or headaches.   Psychiatric: No depression or anxiety.   Endocrine: No increased thirst, change in appetite or thyroid disease.   Hematologic/Lymphatic: No easy bruising or anemia.     EXAM:    Right Knee Exam:  Skin intact  Erythema: None present  Swelling: None present  Effusion: None  ROM: 0-135  Patellofemoral Crepitus: present  Patellar Apprehension at 30 deg: Negative  Patellar grind: positive  Lachman 1A  Anterior Drawer: WNL  Posterior drawer: WNL  Varus at 0 WNL at 30 WNL  Valgus at  0 WNL at 30 WNL  Medial McMurray's: Negative  Lateral McMurray's: Negative  Medial Joint Line TTP: None  Lateral Joint Line TTP: None  Strength: WNL    Other areas of Tenderness: None  DTR and Pathological Reflexes Intact  No lymphadenopathy  Sensation Intact to light touch all distributions of leg and foot  DF, PF, EHL motor intact  Foot Perfused with CR <2 sec  DP/PT pulse 2+    Left Knee Exam:  Skin intact  Erythema: None present  Swelling: None present  Effusion: None  ROM: 0-135  Patellofemoral Crepitus: Negative  Patellar Apprehension at 30 deg: Negative  Lachman 1A  Anterior Drawer: WNL  Posterior drawer: WNL  Varus at 0 WNL at 30 WNL  Valgus at 0 WNL at 30 WNL  Medial McMurray's: Negative  Lateral McMurray's: Negative  Medial Joint Line TTP: None  Lateral Joint Line TTP: None  Strength: WNL    Other areas of Tenderness: None  DTR and Pathological Reflexes Intact  No lymphadenopathy  Sensation Intact to light touch all distributions of  leg and foot  DF, PF, EHL motor intact  Foot Perfused with CR <2 sec  DP/PT pulse 2+    Vitals: BP 105/69    Pulse 77    LMP 11/12/2017    SpO2 97%     General: Elizabeth Dougherty was pleasant, oriented, easily engaged, displayed logical thinking with clear speech and was neat in appearance. Her general appearance was normal, well-developed and well-nourished. She was comfortable in the presence of her knee symptoms.    Gait: The patient demonstrated non antalgic gait with intact coordination and balance.     STUDIES:   Weightbearing AP, lateral, PA flexed and sunrise views of right knee were done and reviewed reviewed today by me. The joint space is well preserved in all three compartments. There are no sclerotic and cystic changes. There are no acute bony abnormalities. The patellae are centralized in femoral sulci.     ASSESSMENT/PLAN:   Elizabeth Dougherty was seen today for knee problem.    Diagnoses and all orders for this visit:    Acute pain of right knee  -     XR Knee Right 4+ Views  -     Ambulatory referral to Physical Therapy    Pain of right patellofemoral joint  -     Ambulatory referral to Physical Therapy    Elizabeth Dougherty is a pleasant 54 y.o. 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  The patient's diagnosis and treatment options were discussed in detail at today's visit. I have taken this opportunity to refer the patient to formal physical therapy.  I have asked the patient to avoid high impact activities and deep knee bent positions if possible.  We will plan on follow up prn if symptoms not resolved with PT.  The patient will notify my clinic of any changes or worsening of their symptoms during the interim.    30 minutes were spent face-to-face with the patient, with coordination of care and counseling about disease process and expect recovery comprising > 50 percent of the visit.

## 2020-05-02 ENCOUNTER — Ambulatory Visit (INDEPENDENT_AMBULATORY_CARE_PROVIDER_SITE_OTHER): Payer: Self-pay | Admitting: Primary Care

## 2020-05-09 MED FILL — LISINOPRIL 40 MG TABLET: 40 | 30 days supply | Qty: 30 | Fill #1

## 2020-05-09 MED FILL — ESCITALOPRAM 20 MG TABLET: 20 | 30 days supply | Qty: 30 | Fill #1

## 2020-05-09 MED FILL — POTASSIUM CL ER 20 MEQ TAB: 20 | 30 days supply | Qty: 60 | Fill #0

## 2020-05-09 MED FILL — CHLORTHALIDONE 50 MG TABLET: 50 | 30 days supply | Qty: 30 | Fill #1

## 2020-06-16 MED FILL — CHLORTHALIDONE 50 MG TABLET: 50 | 30 days supply | Qty: 30 | Fill #2

## 2020-06-16 MED FILL — LISINOPRIL 40 MG TABLET: 40 | 30 days supply | Qty: 30 | Fill #2

## 2020-06-16 MED FILL — hydrOXYzine HCL 25 MG TABS: 25 | 20 days supply | Qty: 60 | Fill #1

## 2020-06-16 MED FILL — POTASSIUM CL ER 20 MEQ TAB: 20 | 30 days supply | Qty: 60 | Fill #1

## 2020-06-16 MED FILL — ESCITALOPRAM 20 MG TABLET: 20 | 30 days supply | Qty: 30 | Fill #2

## 2020-07-15 DIAGNOSIS — U071 COVID-19: Secondary | ICD-10-CM

## 2020-07-15 DIAGNOSIS — J1282 Pneumonia due to coronavirus disease 2019: Secondary | ICD-10-CM

## 2020-07-15 HISTORY — DX: COVID-19: U07.1

## 2020-07-15 HISTORY — DX: Pneumonia due to coronavirus disease 2019: J12.82

## 2020-08-07 ENCOUNTER — Other Ambulatory Visit: Payer: Self-pay

## 2020-08-07 ENCOUNTER — Encounter (HOSPITAL_COMMUNITY): Payer: Self-pay

## 2020-08-07 ENCOUNTER — Emergency Department (HOSPITAL_COMMUNITY): Payer: Medicaid Other

## 2020-08-07 DIAGNOSIS — J9601 Acute respiratory failure with hypoxia: Secondary | ICD-10-CM | POA: Diagnosis present

## 2020-08-07 DIAGNOSIS — J45909 Unspecified asthma, uncomplicated: Secondary | ICD-10-CM | POA: Diagnosis present

## 2020-08-07 DIAGNOSIS — F32A Depression, unspecified: Secondary | ICD-10-CM | POA: Diagnosis present

## 2020-08-07 DIAGNOSIS — E876 Hypokalemia: Secondary | ICD-10-CM | POA: Diagnosis present

## 2020-08-07 DIAGNOSIS — M1990 Unspecified osteoarthritis, unspecified site: Secondary | ICD-10-CM | POA: Diagnosis present

## 2020-08-07 DIAGNOSIS — R7303 Prediabetes: Secondary | ICD-10-CM | POA: Diagnosis present

## 2020-08-07 DIAGNOSIS — M797 Fibromyalgia: Secondary | ICD-10-CM | POA: Diagnosis present

## 2020-08-07 DIAGNOSIS — F411 Generalized anxiety disorder: Secondary | ICD-10-CM | POA: Diagnosis present

## 2020-08-07 DIAGNOSIS — Z833 Family history of diabetes mellitus: Secondary | ICD-10-CM

## 2020-08-07 DIAGNOSIS — Z90710 Acquired absence of both cervix and uterus: Secondary | ICD-10-CM

## 2020-08-07 DIAGNOSIS — I10 Essential (primary) hypertension: Secondary | ICD-10-CM | POA: Diagnosis present

## 2020-08-07 DIAGNOSIS — R9431 Abnormal electrocardiogram [ECG] [EKG]: Secondary | ICD-10-CM | POA: Diagnosis present

## 2020-08-07 DIAGNOSIS — Z8249 Family history of ischemic heart disease and other diseases of the circulatory system: Secondary | ICD-10-CM

## 2020-08-07 DIAGNOSIS — J1282 Pneumonia due to coronavirus disease 2019: Secondary | ICD-10-CM | POA: Diagnosis present

## 2020-08-07 DIAGNOSIS — Z79899 Other long term (current) drug therapy: Secondary | ICD-10-CM

## 2020-08-07 DIAGNOSIS — U071 COVID-19: Secondary | ICD-10-CM | POA: Diagnosis present

## 2020-08-07 DIAGNOSIS — M459 Ankylosing spondylitis of unspecified sites in spine: Secondary | ICD-10-CM | POA: Diagnosis present

## 2020-08-07 DIAGNOSIS — A4189 Other specified sepsis: Principal | ICD-10-CM | POA: Diagnosis present

## 2020-08-07 DIAGNOSIS — Z86718 Personal history of other venous thrombosis and embolism: Secondary | ICD-10-CM

## 2020-08-07 DIAGNOSIS — Z880 Allergy status to penicillin: Secondary | ICD-10-CM

## 2020-08-07 DIAGNOSIS — G4730 Sleep apnea, unspecified: Secondary | ICD-10-CM | POA: Diagnosis present

## 2020-08-07 DIAGNOSIS — K219 Gastro-esophageal reflux disease without esophagitis: Secondary | ICD-10-CM | POA: Diagnosis present

## 2020-08-07 MED ORDER — ACETAMINOPHEN 325 MG PO TABS
650.0000 mg | ORAL_TABLET | Freq: Once | ORAL | Status: AC | PRN
  Administered 2020-08-07: 650 mg via ORAL
  Filled 2020-08-07: qty 2

## 2020-08-07 NOTE — ED Triage Notes (Addendum)
Patient arrived with complaints of increased shortness of breath, cough, sore throat and diarrhea over the last three weeks. Patient is not vaccinated against covid-19 and has not been tested.

## 2020-08-08 ENCOUNTER — Other Ambulatory Visit: Payer: Self-pay

## 2020-08-08 ENCOUNTER — Emergency Department (HOSPITAL_COMMUNITY): Payer: Medicaid Other

## 2020-08-08 ENCOUNTER — Inpatient Hospital Stay (HOSPITAL_COMMUNITY)
Admission: EM | Admit: 2020-08-08 | Discharge: 2020-08-10 | DRG: 871 | Disposition: A | Discharge status: Home / Self Care | Payer: Medicaid Other | Attending: Internal Medicine | Admitting: Internal Medicine

## 2020-08-08 ENCOUNTER — Encounter (HOSPITAL_COMMUNITY): Payer: Self-pay

## 2020-08-08 DIAGNOSIS — R9431 Abnormal electrocardiogram [ECG] [EKG]: Secondary | ICD-10-CM | POA: Diagnosis present

## 2020-08-08 DIAGNOSIS — I10 Essential (primary) hypertension: Secondary | ICD-10-CM | POA: Diagnosis present

## 2020-08-08 DIAGNOSIS — R7303 Prediabetes: Secondary | ICD-10-CM | POA: Diagnosis present

## 2020-08-08 DIAGNOSIS — E876 Hypokalemia: Secondary | ICD-10-CM

## 2020-08-08 DIAGNOSIS — Z833 Family history of diabetes mellitus: Secondary | ICD-10-CM | POA: Diagnosis not present

## 2020-08-08 DIAGNOSIS — Z8249 Family history of ischemic heart disease and other diseases of the circulatory system: Secondary | ICD-10-CM | POA: Diagnosis not present

## 2020-08-08 DIAGNOSIS — J9601 Acute respiratory failure with hypoxia: Secondary | ICD-10-CM | POA: Diagnosis present

## 2020-08-08 DIAGNOSIS — Z880 Allergy status to penicillin: Secondary | ICD-10-CM | POA: Diagnosis not present

## 2020-08-08 DIAGNOSIS — J189 Pneumonia, unspecified organism: Secondary | ICD-10-CM | POA: Diagnosis present

## 2020-08-08 DIAGNOSIS — G4730 Sleep apnea, unspecified: Secondary | ICD-10-CM | POA: Diagnosis present

## 2020-08-08 DIAGNOSIS — Z79899 Other long term (current) drug therapy: Secondary | ICD-10-CM | POA: Diagnosis not present

## 2020-08-08 DIAGNOSIS — M459 Ankylosing spondylitis of unspecified sites in spine: Secondary | ICD-10-CM | POA: Diagnosis present

## 2020-08-08 DIAGNOSIS — J45909 Unspecified asthma, uncomplicated: Secondary | ICD-10-CM | POA: Diagnosis present

## 2020-08-08 DIAGNOSIS — U071 COVID-19: Secondary | ICD-10-CM | POA: Diagnosis present

## 2020-08-08 DIAGNOSIS — A4189 Other specified sepsis: Secondary | ICD-10-CM | POA: Diagnosis present

## 2020-08-08 DIAGNOSIS — Z86718 Personal history of other venous thrombosis and embolism: Secondary | ICD-10-CM | POA: Diagnosis not present

## 2020-08-08 DIAGNOSIS — A403 Sepsis due to Streptococcus pneumoniae: Secondary | ICD-10-CM | POA: Diagnosis present

## 2020-08-08 DIAGNOSIS — F32A Depression, unspecified: Secondary | ICD-10-CM | POA: Diagnosis present

## 2020-08-08 DIAGNOSIS — F411 Generalized anxiety disorder: Secondary | ICD-10-CM | POA: Diagnosis present

## 2020-08-08 DIAGNOSIS — M797 Fibromyalgia: Secondary | ICD-10-CM | POA: Diagnosis present

## 2020-08-08 DIAGNOSIS — Z90710 Acquired absence of both cervix and uterus: Secondary | ICD-10-CM | POA: Diagnosis not present

## 2020-08-08 DIAGNOSIS — K219 Gastro-esophageal reflux disease without esophagitis: Secondary | ICD-10-CM | POA: Diagnosis present

## 2020-08-08 DIAGNOSIS — M1990 Unspecified osteoarthritis, unspecified site: Secondary | ICD-10-CM | POA: Diagnosis present

## 2020-08-08 DIAGNOSIS — J1282 Pneumonia due to coronavirus disease 2019: Secondary | ICD-10-CM | POA: Diagnosis present

## 2020-08-08 LAB — CBC WITH DIFFERENTIAL/PLATELET
Abs Immature Granulocytes: 0.01 10*3/uL (ref 0.00–0.07)
Basophils Absolute: 0 10*3/uL (ref 0.0–0.1)
Basophils Relative: 0 %
Eosinophils Absolute: 0 10*3/uL (ref 0.0–0.5)
Eosinophils Relative: 0 %
HCT: 39.1 % (ref 36.0–46.0)
Hemoglobin: 12 g/dL (ref 12.0–15.0)
Immature Granulocytes: 0 %
Lymphocytes Relative: 28 %
Lymphs Abs: 1.5 10*3/uL (ref 0.7–4.0)
MCH: 21.9 pg — ABNORMAL LOW (ref 26.0–34.0)
MCHC: 30.7 g/dL (ref 30.0–36.0)
MCV: 71.4 fL — ABNORMAL LOW (ref 80.0–100.0)
Monocytes Absolute: 0.3 10*3/uL (ref 0.1–1.0)
Monocytes Relative: 6 %
Neutro Abs: 3.7 10*3/uL (ref 1.7–7.7)
Neutrophils Relative %: 66 %
Platelets: 308 10*3/uL (ref 150–400)
RBC: 5.48 MIL/uL — ABNORMAL HIGH (ref 3.87–5.11)
RDW: 17.8 % — ABNORMAL HIGH (ref 11.5–15.5)
WBC: 5.6 10*3/uL (ref 4.0–10.5)
nRBC: 0 % (ref 0.0–0.2)

## 2020-08-08 LAB — LACTIC ACID, PLASMA: Lactic Acid, Venous: 1.3 mmol/L (ref 0.5–1.9)

## 2020-08-08 LAB — TROPONIN I (HIGH SENSITIVITY)
Troponin I (High Sensitivity): 5 ng/L (ref <18)
Troponin I (High Sensitivity): 4 ng/L (ref <18)

## 2020-08-08 LAB — BASIC METABOLIC PANEL
Anion gap: 10 (ref 5–15)
BUN: 17 mg/dL (ref 6–20)
CO2: 27 mmol/L (ref 22–32)
Calcium: 8 mg/dL — ABNORMAL LOW (ref 8.9–10.3)
Chloride: 101 mmol/L (ref 98–111)
Creatinine, Ser: 0.83 mg/dL (ref 0.44–1.00)
GFR, Estimated: >60 mL/min (ref >60)
Glucose, Bld: 169 mg/dL — ABNORMAL HIGH (ref 70–99)
Potassium: 3.5 mmol/L (ref 3.5–5.1)
Sodium: 138 mmol/L (ref 135–145)

## 2020-08-08 LAB — POC SARS CORONAVIRUS 2 AG -  ED: SARS Coronavirus 2 Ag: NEGATIVE

## 2020-08-08 LAB — COMPREHENSIVE METABOLIC PANEL
ALT: 42 U/L (ref 0–44)
AST: 45 U/L — ABNORMAL HIGH (ref 15–41)
Albumin: 3.7 g/dL (ref 3.5–5.0)
Alkaline Phosphatase: 78 U/L (ref 38–126)
Anion gap: 15 (ref 5–15)
BUN: 15 mg/dL (ref 6–20)
CO2: 28 mmol/L (ref 22–32)
Calcium: 8.5 mg/dL — ABNORMAL LOW (ref 8.9–10.3)
Chloride: 96 mmol/L — ABNORMAL LOW (ref 98–111)
Creatinine, Ser: 0.85 mg/dL (ref 0.44–1.00)
GFR, Estimated: >60 mL/min (ref >60)
Glucose, Bld: 107 mg/dL — ABNORMAL HIGH (ref 70–99)
Potassium: 2.8 mmol/L — ABNORMAL LOW (ref 3.5–5.1)
Sodium: 139 mmol/L (ref 135–145)
Total Bilirubin: 0.5 mg/dL (ref 0.3–1.2)
Total Protein: 8.1 g/dL (ref 6.5–8.1)

## 2020-08-08 LAB — LIPASE, BLOOD: Lipase: 27 U/L (ref 11–51)

## 2020-08-08 LAB — LACTATE DEHYDROGENASE: LDH: 207 U/L — ABNORMAL HIGH (ref 98–192)

## 2020-08-08 LAB — D-DIMER, QUANTITATIVE: D-Dimer, Quant: 1.03 ug/mL-FEU — ABNORMAL HIGH (ref 0.00–0.50)

## 2020-08-08 LAB — SARS CORONAVIRUS 2 BY RT PCR (HOSPITAL ORDER, PERFORMED IN ~~LOC~~ HOSPITAL LAB): SARS Coronavirus 2: POSITIVE — AB

## 2020-08-08 LAB — FIBRINOGEN: Fibrinogen: 471 mg/dL (ref 210–475)

## 2020-08-08 LAB — I-STAT BETA HCG BLOOD, ED (MC, WL, AP ONLY): I-stat hCG, quantitative: <5 m[IU]/mL (ref <5)

## 2020-08-08 LAB — TRIGLYCERIDES: Triglycerides: 129 mg/dL (ref <150)

## 2020-08-08 LAB — HIV ANTIBODY (ROUTINE TESTING W REFLEX): HIV Screen 4th Generation wRfx: NONREACTIVE

## 2020-08-08 LAB — FERRITIN: Ferritin: 137 ng/mL (ref 11–307)

## 2020-08-08 LAB — PROCALCITONIN: Procalcitonin: <0.1 ng/mL

## 2020-08-08 LAB — BRAIN NATRIURETIC PEPTIDE: B Natriuretic Peptide: 28.3 pg/mL (ref 0.0–100.0)

## 2020-08-08 LAB — C-REACTIVE PROTEIN: CRP: 4.9 mg/dL — ABNORMAL HIGH (ref <1.0)

## 2020-08-08 MED ORDER — DICYCLOMINE HCL 10 MG PO CAPS: 10.0000 mg | ORAL_CAPSULE | Freq: Three times a day (TID) | ORAL | Status: DC | PRN

## 2020-08-08 MED ORDER — ALBUTEROL SULFATE HFA 108 (90 BASE) MCG/ACT IN AERS
4.0000 | INHALATION_SPRAY | Freq: Once | RESPIRATORY_TRACT | Status: AC
  Administered 2020-08-08: 4 via RESPIRATORY_TRACT
  Filled 2020-08-08: qty 6.7

## 2020-08-08 MED ORDER — PANTOPRAZOLE SODIUM 40 MG PO TBEC
40.0000 mg | DELAYED_RELEASE_TABLET | Freq: Every day | ORAL | Status: DC
  Administered 2020-08-09 – 2020-08-10 (×2): 40 mg via ORAL
  Filled 2020-08-08 (×2): qty 1

## 2020-08-08 MED ORDER — ONDANSETRON HCL 4 MG PO TABS: 4.0000 mg | ORAL_TABLET | Freq: Four times a day (QID) | ORAL | Status: DC | PRN

## 2020-08-08 MED ORDER — ACETAMINOPHEN 325 MG PO TABS: 650.0000 mg | ORAL_TABLET | Freq: Four times a day (QID) | ORAL | Status: DC | PRN

## 2020-08-08 MED ORDER — METOPROLOL TARTRATE 50 MG PO TABS
50.0000 mg | ORAL_TABLET | Freq: Two times a day (BID) | ORAL | Status: DC
  Administered 2020-08-08 – 2020-08-10 (×4): 50 mg via ORAL
  Filled 2020-08-08 (×4): qty 1

## 2020-08-08 MED ORDER — GUAIFENESIN ER 600 MG PO TB12
600.0000 mg | ORAL_TABLET | Freq: Two times a day (BID) | ORAL | Status: DC
  Administered 2020-08-08 – 2020-08-10 (×4): 600 mg via ORAL
  Filled 2020-08-08 (×4): qty 1

## 2020-08-08 MED ORDER — ONDANSETRON HCL 4 MG/2ML IJ SOLN: 4.0000 mg | Freq: Four times a day (QID) | INTRAMUSCULAR | Status: DC | PRN

## 2020-08-08 MED ORDER — MAGNESIUM SULFATE 2 GM/50ML IV SOLN
2.0000 g | Freq: Once | INTRAVENOUS | Status: DC
  Filled 2020-08-08: qty 50

## 2020-08-08 MED ORDER — LISINOPRIL 20 MG PO TABS
40.0000 mg | ORAL_TABLET | Freq: Every day | ORAL | Status: DC
  Administered 2020-08-08 – 2020-08-10 (×3): 40 mg via ORAL
  Filled 2020-08-08 (×3): qty 2

## 2020-08-08 MED ORDER — SODIUM CHLORIDE 0.9 % IV SOLN
1.0000 g | INTRAVENOUS | Status: DC
  Filled 2020-08-08: qty 10

## 2020-08-08 MED ORDER — ESCITALOPRAM OXALATE 20 MG PO TABS
20.0000 mg | ORAL_TABLET | Freq: Every day | ORAL | Status: DC
  Administered 2020-08-09 – 2020-08-10 (×2): 20 mg via ORAL
  Filled 2020-08-08 (×2): qty 1

## 2020-08-08 MED ORDER — BENZONATATE 100 MG PO CAPS
100.0000 mg | ORAL_CAPSULE | Freq: Three times a day (TID) | ORAL | Status: DC
  Administered 2020-08-08 – 2020-08-10 (×5): 100 mg via ORAL
  Filled 2020-08-08 (×5): qty 1

## 2020-08-08 MED ORDER — HYDROXYZINE HCL 25 MG PO TABS
12.5000 mg | ORAL_TABLET | Freq: Three times a day (TID) | ORAL | Status: DC | PRN
  Administered 2020-08-08 – 2020-08-09 (×2): 25 mg via ORAL
  Filled 2020-08-08 (×2): qty 1

## 2020-08-08 MED ORDER — CHLORTHALIDONE 50 MG PO TABS
50.0000 mg | ORAL_TABLET | Freq: Every day | ORAL | Status: DC
Start: 2020-08-09 — End: 2020-08-10
  Administered 2020-08-09 – 2020-08-10 (×2): 50 mg via ORAL
  Filled 2020-08-08 (×2): qty 1

## 2020-08-08 MED ORDER — SODIUM CHLORIDE 0.9 % IV SOLN: 1.0000 g | INTRAVENOUS | Status: DC

## 2020-08-08 MED ORDER — SODIUM CHLORIDE 0.9 % IV SOLN: 500.0000 mg | INTRAVENOUS | Status: DC

## 2020-08-08 MED ORDER — DEXAMETHASONE SODIUM PHOSPHATE 10 MG/ML IJ SOLN
6.0000 mg | INTRAMUSCULAR | Status: DC
  Administered 2020-08-08: 6 mg via INTRAVENOUS
  Filled 2020-08-08: qty 1

## 2020-08-08 MED ORDER — POTASSIUM CHLORIDE CRYS ER 20 MEQ PO TBCR
40.0000 meq | EXTENDED_RELEASE_TABLET | Freq: Once | ORAL | Status: AC
  Administered 2020-08-08: 40 meq via ORAL
  Filled 2020-08-08: qty 2

## 2020-08-08 MED ORDER — POLYETHYLENE GLYCOL 3350 17 G PO PACK: 17.0000 g | PACK | Freq: Every day | ORAL | Status: DC | PRN

## 2020-08-08 MED ORDER — POTASSIUM CHLORIDE 10 MEQ/100ML IV SOLN
10.0000 meq | INTRAVENOUS | Status: AC
  Administered 2020-08-08 (×3): 10 meq via INTRAVENOUS
  Filled 2020-08-08 (×3): qty 100

## 2020-08-08 MED ORDER — IPRATROPIUM BROMIDE HFA 17 MCG/ACT IN AERS
2.0000 | INHALATION_SPRAY | Freq: Four times a day (QID) | RESPIRATORY_TRACT | Status: DC
  Administered 2020-08-08 – 2020-08-10 (×6): 2 via RESPIRATORY_TRACT
  Filled 2020-08-08: qty 12.9

## 2020-08-08 MED ORDER — ENOXAPARIN SODIUM 40 MG/0.4ML ~~LOC~~ SOLN
40.0000 mg | SUBCUTANEOUS | Status: DC
  Administered 2020-08-08 – 2020-08-09 (×2): 40 mg via SUBCUTANEOUS
  Filled 2020-08-08 (×2): qty 0.4

## 2020-08-08 MED ORDER — DOXYCYCLINE HYCLATE 100 MG PO TABS
100.0000 mg | ORAL_TABLET | Freq: Two times a day (BID) | ORAL | Status: DC
  Administered 2020-08-08 – 2020-08-10 (×4): 100 mg via ORAL
  Filled 2020-08-08 (×4): qty 1

## 2020-08-08 NOTE — Progress Notes (Signed)
Instructed PT on Flutter device. 

## 2020-08-08 NOTE — H&P (Addendum)
History and Physical    Krina Barylski [id number] DOB: Aug 27, 1965 DOA: 08/08/2020  PCP: Antony Blackbird, MD (Inactive)    Patient coming from: Home  I have personally briefly reviewed patient's old medical records in [place]  Chief Complaint: Increasing shortness of breath  HPI: Theresa Pearson is a 55 y.o. female with HTN, prediabetes, GERD, anxiety/fibromyalgia, ankylosing spondylitis, h/o DVT in 1994 who presented to [place] with worsening dyspnea.  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  Symptoms marked by extreme exhaustion and cough that was initially dry but now productive of greenish phlegm.  She also reports subjective fever and chills. Has been trying to self medicate with Mucinex and other OTC meds at home However dyspnea got worse last night therefore decided to come to the ED.  Also reports intermittent epigastric discomfort that is nonradiating reproducible with palpation and without any alleviating factors.  Reports loose watery stool however does have a history of IBS and takes Bentyl. No nausea or vomiting.  ED Course:  T-max 101.88 Blood pressure 126/74, RR 33, SPO2 96% on 2 L nasal cannula  -Lactic acid 1.3, WBC 5.6, Hb 12, platelets 308 -Sodium 139, potassium 2.8, BUN 15, creatinine 1.85 -D-dimer 1.03, procalcitonin less than 0.10, fibrinogen 471, LDH 207, ferritin 137, CRP 4.9  ED meds: -Decadron 6 mg IV -Potassium chloride 10 mEq as well as K-Lor 40 mEq. Tylenol 650 Albuterol inhaler.  Review of Systems: As per HPI otherwise 10 point review of systems negative.    Past Medical History:  Diagnosis Date  . Anemia   . Anxiety   . Arthritis   . Asthma   . Chronic headaches   . Depression   . Diverticulosis   . Fibromyalgia   . Gallstones   . GERD (gastroesophageal reflux disease)   . Hypertension   . Pneumonia   . Right leg DVT ([place])   . Sleep apnea     Past  Surgical History:  Procedure Laterality Date  . ABDOMINAL HYSTERECTOMY  2008   for  fibroids   . CESAREAN SECTION    . CHOLECYSTECTOMY       reports that she has never smoked. She has never used smokeless tobacco. She reports current alcohol use of about 1.0 - 2.0 standard drink of alcohol per week. She reports that she does not use drugs.  Allergies  Allergen Reactions  . Penicillins Shortness Of Breath and Rash    Family History  Problem Relation Age of Onset  . CAD Mother   . Diabetes Mellitus II Mother   . Stroke Mother   . Colon polyps Mother   . Crohn's disease Mother   . Allergy (severe) Sister   . Colon polyps Sister   . Allergy (severe) Son   . Prostate cancer Maternal Grandfather   . Diabetes Maternal Grandmother   . Hypertension Maternal Grandmother   . Heart attack Maternal Grandmother      Prior to Admission medications   Medication Sig Start Date End Date Taking? Authorizing Provider  acetaminophen (TYLENOL) 500 MG tablet Take 500 mg by mouth every 6 (six) hours as needed for mild pain.   Yes [provider]  albuterol (VENTOLIN HFA) 108 (90 Base) MCG/ACT inhaler Inhale 2 puffs into the lungs every 6 (six) hours as needed for wheezing or shortness of breath. 02/02/20  Yes McClung, Angela M, PA-C  chlorthalidone (HYGROTON) 50 MG tablet Take 1 tablet (50  mg total) by mouth daily. 02/02/20  Yes Argentina Donovan, PA-C  cholecalciferol (VITAMIN D3) 25 MCG (1000 UNIT) tablet Take 1,000 Units by mouth daily.   Yes [provider]  diclofenac sodium (VOLTAREN) 1 % GEL Apply 2 g topically 4 (four) times daily. Patient taking differently: Apply 2 g topically 4 (four) times daily as needed (pain). 04/21/17  Yes Draper, Carlos Levering, DO  dicyclomine (BENTYL) 10 MG capsule Take 1-2 capsules (10-20 mg total) by mouth every 8 (eight) hours as needed for spasms. 03/12/19  Yes Fulp, Cammie, MD  escitalopram (LEXAPRO) 20 MG tablet Take 1 tablet (20 mg total) by mouth  daily. 02/02/20  Yes Argentina Donovan, PA-C  hydrOXYzine (ATARAX/VISTARIL) 25 MG tablet 1/2 to 1 tid prn anxiety Patient taking differently: Take 12.5-25 mg by mouth every 8 (eight) hours as needed for anxiety. 1/2 to 1 tid prn anxiety 02/02/20  Yes McClung, Angela M, PA-C  lisinopril (ZESTRIL) 40 MG tablet Take 1 tablet (40 mg total) by mouth daily. 02/02/20  Yes Argentina Donovan, PA-C  metoprolol tartrate (LOPRESSOR) 50 MG tablet TAKE 1 TABLET(50 MG) BY MOUTH TWICE DAILY 02/02/20  Yes McClung, Angela M, PA-C  Multiple Vitamins-Minerals (MULTIVITAMIN PO) Take 1 tablet by mouth daily.   Yes [provider]  omeprazole (PRILOSEC) 20 MG capsule Take 1 capsule (20 mg total) by mouth daily. 02/02/20  Yes McClung, Angela M, PA-C  potassium chloride SA (KLOR-CON) 20 MEQ tablet Take 1 tablet (20 mEq total) by mouth 2 (two) times daily. 02/02/20  Yes Argentina Donovan, PA-C  vitamin B-12 (CYANOCOBALAMIN) 1000 MCG tablet Take 1,000 mcg by mouth daily.   Yes [provider]  zinc gluconate 50 MG tablet Take 50 mg by mouth daily.   Yes [provider]  naproxen (NAPROSYN) 500 MG tablet Take twice a day for seven days then as needed Patient not taking: No sig reported 01/22/16   Thurman Coyer, DO    Physical Exam: Vitals:   08/08/20 1510 08/08/20 1515 08/08/20 1530 08/08/20 1700  BP: 130/75 130/75 125/78 126/74  Pulse:  86 80 88  Resp: (!) 30 (!) 38 (!) 33 (!) 33  Temp:      TempSrc:      SpO2:  92% 95% 96%    Constitutional: NAD, calm, comfortable Vitals:   08/08/20 1510 08/08/20 1515 08/08/20 1530 08/08/20 1700  BP: 130/75 130/75 125/78 126/74  Pulse:  86 80 88  Resp: (!) 30 (!) 38 (!) 33 (!) 33  Temp:      TempSrc:      SpO2:  92% 95% 96%   Eyes: PERRL, lids and conjunctivae normal ENMT: Mucous membranes are moist. Posterior pharynx clear of any exudate or lesions.Normal dentition.  Neck: normal, supple, no masses, no thyromegaly Respiratory: clear to  auscultation bilaterally, no wheezing, no crackles. Normal respiratory effort. No accessory muscle use.  Cardiovascular: Regular rate and rhythm, no murmurs / rubs / gallops. No extremity edema. 2+ pedal pulses. No carotid bruits.  Abdomen: no tenderness, no masses palpated. No hepatosplenomegaly. Bowel sounds positive.  Musculoskeletal: no clubbing / cyanosis. No joint deformity upper and lower extremities. Good ROM, no contractures. Normal muscle tone.  Skin: no rashes, lesions, ulcers. No induration Neurologic: CN 2-12 grossly intact. Sensation intact, DTR normal. Strength 5/5 in all 4.  Psychiatric: Normal judgment and insight. Alert and oriented x 3. Normal mood.     Labs on Admission: I have personally reviewed following labs and  imaging studies  CBC: Recent Labs  Lab 08/08/20 1336  WBC 5.6  NEUTROABS 3.7  HGB 12.0  HCT 39.1  MCV 71.4*  PLT 161   Basic Metabolic Panel: Recent Labs  Lab 08/08/20 1336  NA 139  K 2.8*  CL 96*  CO2 28  GLUCOSE 107*  BUN 15  CREATININE 0.85  CALCIUM 8.5*   GFR: CrCl cannot be calculated (Unknown ideal weight.). Liver Function Tests: Recent Labs  Lab 08/08/20 1336  AST 45*  ALT 42  ALKPHOS 78  BILITOT 0.5  PROT 8.1  ALBUMIN 3.7   Lipid Profile: Recent Labs    08/08/20 1336  TRIG 129   Thyroid Function Tests: No results for input(s): TSH, T4TOTAL, FREET4, T3FREE, THYROIDAB in the last 72 hours. Anemia Panel: Recent Labs    08/08/20 1342  FERRITIN 137   Urine analysis:    Component Value Date/Time   COLORURINE YELLOW 06/27/2013 [place] 06/27/2013 1730   LABSPEC 1.013 06/27/2013 1730   PHURINE 6.5 06/27/2013 1730   GLUCOSEU NEGATIVE 06/27/2013 1730   HGBUR NEGATIVE 06/27/2013 1730   BILIRUBINUR NEGATIVE 06/27/2013 1730   KETONESUR NEGATIVE 06/27/2013 1730   PROTEINUR NEGATIVE 06/27/2013 1730   UROBILINOGEN 0.2 06/27/2013 1730   NITRITE NEGATIVE 06/27/2013 1730   LEUKOCYTESUR NEGATIVE  06/27/2013 1730    Radiological Exams on Admission: DG Chest 2 View  Result Date: 08/07/2020 CLINICAL DATA:  Shortness of breath EXAM: CHEST - 2 VIEW COMPARISON:  08/06/2016 FINDINGS: Streaky perihilar opacities with patchy left lung base ground-glass opacity. No pleural effusion. Normal heart size. No pneumothorax. IMPRESSION: Streaky perihilar opacities with patchy left lung base ground-glass opacity, suspicious for pneumonia, possible atypical or viral pneumonia Electronically Signed   By: Donavan Foil M.D.   On: 08/07/2020 23:21   CT Chest Wo Contrast  Result Date: 08/08/2020 CLINICAL DATA:  Shortness of breath, cough EXAM: CT CHEST WITHOUT CONTRAST TECHNIQUE: Multidetector CT imaging of the chest was performed following the standard protocol without IV contrast. COMPARISON:  Chest x-ray 08/07/2020 FINDINGS: Cardiovascular: Heart is normal size. Aorta is normal caliber. Mediastinum/Nodes: Borderline scattered mediastinal lymph nodes. No visible axillary or hilar adenopathy. Lungs/Pleura: Patchy bilateral predominantly peripheral ground-glass airspace opacities are noted concerning for atypical infection. No effusions. Upper Abdomen: Imaging into the upper abdomen demonstrates no acute findings. Musculoskeletal: Chest wall soft tissues are unremarkable. No acute bony abnormality. IMPRESSION: Patchy peripheral ground-glass opacities concerning for atypical/viral pneumonia. COVID pneumonia could have this appearance. Electronically Signed   By: Rolm Baptise M.D.   On: 08/08/2020 17:21    EKG: Independently reviewed.  Sinus tach at 101 bpm, normal axis, no ST-T wave changes.  Prolonged QTC of 541 ms.  Assessment/Plan Active Problems:   Hypokalemia   Multifocal pneumonia   Sepsis due to pneumococcus (HCC)   HTN (hypertension)   GAD (generalized anxiety disorder)   Prolonged QT interval   #Multifocal pneumonia #Covid infection #Sepsis -Patient is almost 3 weeks from onset of symptoms.   Inflammatory markers unimpressive. I am reluctant to start any antivirals or biologicals. -Given patient has clear-cut sepsis with fever 101.8, CT scan with multifocal pneumonia and greenish phlegm I am inclined to treat her with antibiotics for CAP.  Ceftriaxone and doxycycline given prolonged QTC. -Continue with antitussives.  Follow-up blood cultures.  #Hypokalemia with prolonged QTC of 541 ms -Status post potassium and magnesium replacement -Please follow-up with repeat EKG in a.m.  #Epigastric pain Weakness fatigue for 1 week weakness and fatigue  for 1 week weakness and fatigue for 1 week weakness and fatigue for 1 week -appears noncardiac given reproducible with palpation.  EKG is nonischemic however will follow up with troponin x2.  Continue with PPI.  #HTN -Continue with lisinopril and metoprolol.  #GERD -Continue with PPIs  DVT prophylaxis: Subcu heparin   code Status: Full code Family Communication: None  Disposition Plan: Home   consults called: None Admission status: Inpatient on MedSurg.   Avrohom Mckelvin MD [facility]   If 7PM-7AM, please contact night-coverage [email or web address] Password [facility]  08/08/2020, 5:24 PM

## 2020-08-08 NOTE — ED Notes (Signed)
Bedside commode placed by pt bedside. Pt ambulatory to commode.

## 2020-08-08 NOTE — Progress Notes (Signed)
   08/08/20 2046  Assess: MEWS Score  Temp 98.4 F (36.9 C)  BP 113/69  Pulse Rate 89  Resp (!) 34  Level of Consciousness Alert  SpO2 95 %  O2 Device Nasal Cannula  O2 Flow Rate (L/min) 2 L/min  Assess: MEWS Score  MEWS Temp 0  MEWS Systolic 0  MEWS Pulse 0  MEWS RR 2  MEWS LOC 0  MEWS Score 2  MEWS Score Color Yellow  Assess: if the MEWS score is Yellow or Red  Were vital signs taken at a resting state? Yes  Focused Assessment No change from prior assessment  Early Detection of Sepsis Score *See Row Information* Low  MEWS guidelines implemented *See Row Information* Yes  Treat  MEWS Interventions Administered scheduled meds/treatments  Pain Scale 0-10  Pain Score 0  Take Vital Signs  Increase Vital Sign Frequency  Yellow: Q 2hr X 2 then Q 4hr X 2, if remains yellow, continue Q 4hrs  Escalate  MEWS: Escalate Yellow: discuss with charge nurse/RN and consider discussing with provider and RRT  Notify: Charge Nurse/RN  Name of Charge Nurse/RN Notified Tom RN  Date Charge Nurse/RN Notified 08/08/20  Time Charge Nurse/RN Notified 2050  Document  Progress note created (see row info) Yes  RR elevated. Scheduled meds given, see MAR. Will continue to monitor. Pt is not in distress.

## 2020-08-08 NOTE — ED Provider Notes (Signed)
[facility] DEPT Provider Note   CSN: [id number] Arrival date & time: 08/07/20  2237     History Chief Complaint  Patient presents with  . Shortness of Breath    Theresa Pearson is a 55 y.o. female.  The history is provided by the patient.  Shortness of Breath Severity:  Moderate Onset quality:  Gradual Timing:  Constant Progression:  Worsening Chronicity:  New Context: URI (cough, sob, hasnt felt well for a few weeks. Patient drives trucks. Not vaccinated against covid.)   Relieved by:  Nothing Worsened by:  Exertion and coughing Associated symptoms: cough, fever, sore throat and wheezing   Associated symptoms: no abdominal pain, no chest pain, no ear pain, no rash and no vomiting   Risk factors: hx of PE/DVT        Past Medical History:  Diagnosis Date  . Anemia   . Anxiety   . Arthritis   . Asthma   . Chronic headaches   . Depression   . Diverticulosis   . Fibromyalgia   . Gallstones   . GERD (gastroesophageal reflux disease)   . Hypertension   . Pneumonia   . Right leg DVT ([place])   . Sleep apnea     Patient Active Problem List   Diagnosis Date Noted  . Epigastric pain 06/24/2016  . Bloody diarrhea 06/24/2016  . Bilateral chronic knee pain 10/16/2015  . Severe obesity (BMI >= 40) ([place]) 10/16/2015  . Anemia 09/19/2015  . Vitamin D deficiency 09/19/2015  . Bilateral impacted cerumen 09/19/2015  . GAD (generalized anxiety disorder) 03/04/2014  . HTN (hypertension) 06/27/2013  . Sleep apnea 06/27/2013    Past Surgical History:  Procedure Laterality Date  . ABDOMINAL HYSTERECTOMY  2008   for  fibroids   . CESAREAN SECTION    . CHOLECYSTECTOMY       OB History   No obstetric history on file.     Family History  Problem Relation Age of Onset  . CAD Mother   . Diabetes Mellitus II Mother   . Stroke Mother   . Colon polyps Mother   . Crohn's disease Mother   . Allergy (severe) Sister   . Colon polyps Sister    . Allergy (severe) Son   . Prostate cancer Maternal Grandfather   . Diabetes Maternal Grandmother   . Hypertension Maternal Grandmother   . Heart attack Maternal Grandmother     Social History   Tobacco Use  . Smoking status: Never Smoker  . Smokeless tobacco: Never Used  Substance Use Topics  . Alcohol use: Yes    Alcohol/week: 1.0 - 2.0 standard drink    Types: 1 - 2 Glasses of wine per week    Comment: occoasional.  . Drug use: No    Home Medications Prior to Admission medications   Medication Sig Start Date End Date Taking? Authorizing Provider  albuterol (VENTOLIN HFA) 108 (90 Base) MCG/ACT inhaler Inhale 2 puffs into the lungs every 6 (six) hours as needed for wheezing or shortness of breath. 02/02/20   Thereasa Solo, Dionne Bucy, PA-C  chlorthalidone (HYGROTON) 50 MG tablet Take 1 tablet (50 mg total) by mouth daily. 02/02/20   Argentina Donovan, PA-C  diclofenac sodium (VOLTAREN) 1 % GEL Apply 2 g topically 4 (four) times daily. 04/21/17   Thurman Coyer, DO  dicyclomine (BENTYL) 10 MG capsule Take 1-2 capsules (10-20 mg total) by mouth every 8 (eight) hours as needed for spasms. 03/12/19   Fulp,  Cammie, MD  escitalopram (LEXAPRO) 20 MG tablet Take 1 tablet (20 mg total) by mouth daily. 02/02/20   Argentina Donovan, PA-C  hydrOXYzine (ATARAX/VISTARIL) 25 MG tablet 1/2 to 1 tid prn anxiety 02/02/20   Argentina Donovan, PA-C  lisinopril (ZESTRIL) 40 MG tablet Take 1 tablet (40 mg total) by mouth daily. 02/02/20   Argentina Donovan, PA-C  metoprolol tartrate (LOPRESSOR) 50 MG tablet TAKE 1 TABLET(50 MG) BY MOUTH TWICE DAILY 02/02/20   Argentina Donovan, PA-C  Multiple Vitamins-Minerals (MULTIVITAMIN PO) Take 1 tablet by mouth daily.    [provider]  naproxen (NAPROSYN) 500 MG tablet Take twice a day for seven days then as needed Patient not taking: Reported on 09/02/2016 01/22/16   Thurman Coyer, DO  omeprazole (PRILOSEC) 20 MG capsule Take 1 capsule (20 mg total) by mouth  daily. 02/02/20   Argentina Donovan, PA-C  potassium chloride SA (KLOR-CON) 20 MEQ tablet Take 1 tablet (20 mEq total) by mouth 2 (two) times daily. 02/02/20   Argentina Donovan, PA-C    Allergies    Penicillins  Review of Systems   Review of Systems  Constitutional: Positive for fever. Negative for chills.  HENT: Positive for sore throat. Negative for ear pain.   Eyes: Negative for pain and visual disturbance.  Respiratory: Positive for cough, shortness of breath and wheezing.   Cardiovascular: Negative for chest pain and palpitations.  Gastrointestinal: Negative for abdominal pain and vomiting.  Genitourinary: Negative for dysuria and hematuria.  Musculoskeletal: Negative for arthralgias and back pain.  Skin: Negative for color change and rash.  Neurological: Negative for seizures and syncope.  All other systems reviewed and are negative.   Physical Exam Updated Vital Signs  ED Triage Vitals  Enc Vitals Group     BP 08/07/20 2251 (!) 133/101     Pulse Rate 08/07/20 2251 (!) 106     Resp 08/07/20 2251 (!) 22     Temp 08/07/20 2251 (!) 101.8 F (38.8 C)     Temp Source 08/07/20 2251 Oral     SpO2 08/07/20 2251 90 %     Weight --      Height --      Head Circumference --      Peak Flow --      Pain Score 08/07/20 2253 0     Pain Loc --      Pain Edu? --      Excl. in [place]? --     Physical Exam Vitals and nursing note reviewed.  Constitutional:      General: She is in acute distress.     Appearance: She is well-developed and well-nourished. She is obese. She is ill-appearing.  HENT:     Head: Normocephalic and atraumatic.  Eyes:     Conjunctiva/sclera: Conjunctivae normal.     Pupils: Pupils are equal, round, and reactive to light.  Cardiovascular:     Rate and Rhythm: Normal rate and regular rhythm.     Pulses: Normal pulses.     Heart sounds: Normal heart sounds. No murmur heard.   Pulmonary:     Effort: Tachypnea present. No respiratory distress.     Breath  sounds: Decreased breath sounds and wheezing present.  Abdominal:     Palpations: Abdomen is soft.     Tenderness: There is no abdominal tenderness.  Musculoskeletal:        General: No edema.     Cervical back: Normal range of  motion and neck supple.     Right lower leg: No edema.     Left lower leg: No edema.  Skin:    General: Skin is warm and dry.     Capillary Refill: Capillary refill takes less than 2 seconds.  Neurological:     Mental Status: She is alert.  Psychiatric:        Mood and Affect: Mood and affect normal.     ED Results / Procedures / Treatments   Labs (all labs ordered are listed, but only abnormal results are displayed) Labs Reviewed  CBC WITH DIFFERENTIAL/PLATELET - Abnormal; Notable for the following components:      Result Value   RBC 5.48 (*)    MCV 71.4 (*)    MCH 21.9 (*)    RDW 17.8 (*)    All other components within normal limits  COMPREHENSIVE METABOLIC PANEL - Abnormal; Notable for the following components:   Potassium 2.8 (*)    Chloride 96 (*)    Glucose, Bld 107 (*)    Calcium 8.5 (*)    AST 45 (*)    All other components within normal limits  D-DIMER, QUANTITATIVE (NOT AT [facility]) - Abnormal; Notable for the following components:   D-Dimer, Quant 1.03 (*)    All other components within normal limits  LACTATE DEHYDROGENASE - Abnormal; Notable for the following components:   LDH 207 (*)    All other components within normal limits  C-REACTIVE PROTEIN - Abnormal; Notable for the following components:   CRP 4.9 (*)    All other components within normal limits  CULTURE, BLOOD (ROUTINE X 2)  CULTURE, BLOOD (ROUTINE X 2)  SARS CORONAVIRUS 2 BY RT PCR (HOSPITAL ORDER, [facility])  LACTIC ACID, PLASMA  FERRITIN  TRIGLYCERIDES  FIBRINOGEN  PROCALCITONIN  HIV ANTIBODY (ROUTINE TESTING W REFLEX)  POC SARS CORONAVIRUS 2 AG -  ED  I-STAT BETA HCG BLOOD, ED ([facility], [facility], [facility] ONLY)    EKG EKG  Interpretation  Date/Time:  Monday August 07 2020 23:03:38 EST Ventricular Rate:  101 PR Interval:    QRS Duration: 92 QT Interval:  417 QTC Calculation: 541 R Axis:   9 Text Interpretation: Sinus tachycardia LVH by voltage Prolonged QT interval Rate is faster Confirmed by Shanon Rosser [phone]) on 08/07/2020 11:09:35 PM   Radiology DG Chest 2 View  Result Date: 08/07/2020 CLINICAL DATA:  Shortness of breath EXAM: CHEST - 2 VIEW COMPARISON:  08/06/2016 FINDINGS: Streaky perihilar opacities with patchy left lung base ground-glass opacity. No pleural effusion. Normal heart size. No pneumothorax. IMPRESSION: Streaky perihilar opacities with patchy left lung base ground-glass opacity, suspicious for pneumonia, possible atypical or viral pneumonia Electronically Signed   By: Donavan Foil M.D.   On: 08/07/2020 23:21    Procedures .Critical Care Performed by: Lennice Sites, DO Authorized by: Lennice Sites, DO   Critical care provider statement:    Critical care time (minutes):  45   Critical care was necessary to treat or prevent imminent or life-threatening deterioration of the following conditions:  Respiratory failure   Critical care was time spent personally by me on the following activities:  Blood draw for specimens, development of treatment plan with patient or surrogate, discussions with primary provider, evaluation of patient's response to treatment, examination of patient, obtaining history from patient or surrogate, ordering and performing treatments and interventions, ordering and review of laboratory studies, ordering and review of radiographic studies, pulse oximetry, re-evaluation of patient's  condition and review of old charts   I assumed direction of critical care for this patient from another provider in my specialty: no       Medications Ordered in ED Medications  dexamethasone (DECADRON) injection 6 mg (6 mg Intravenous Given 08/08/20 1443)  potassium chloride 10 mEq in  100 mL IVPB (has no administration in time range)  potassium chloride SA (KLOR-CON) CR tablet 40 mEq (has no administration in time range)  acetaminophen (TYLENOL) tablet 650 mg (650 mg Oral Given 08/07/20 2259)  albuterol (VENTOLIN HFA) 108 (90 Base) MCG/ACT inhaler 4 puff (4 puffs Inhalation Given 08/08/20 1349)    ED Course  I have reviewed the triage vital signs and the nursing notes.  Pertinent labs & imaging results that were available during my care of the patient were reviewed by me and considered in my medical decision making (see chart for details).    MDM Rules/Calculators/A&P                          Anapatricia Plueger is a 55 year old female with history of asthma, hypertension, DVT who presents to the ED with shortness of breath, fever.  Patient comes febrile, tachypneic.  She is hypoxic on ambulation in the room.  Chest x-ray has already been done that is concerning for multifocal pneumonia and suspect Covid.  She is unvaccinated.  EKG shows sinus tachycardia.  Fever has improved following Tylenol while in the waiting room.  She does have some mild wheezing.  May be some mild asthma exacerbation.  Overall suspect Covid pneumonia versus bacterial pneumonia.  Lower suspicion for PE given chest x-ray findings.  Not having any chest pain and doubt ACS.  Will get Covid test and Covid screening labs.  Will hold off on any treatment at this time until Covid is confirmed.  White count and lactic acid within normal limits.  Mild elevation in CRP, D-dimer, LDH.  Rapid Covid test is negative but PCR Covid test has been ordered.  Potassium is 2.8 and IV and oral repletion has been started.  Overall suspect Covid.  Will hold antibiotics at this time and wait until PCR test.  Will to hospitalist given new oxygen requirement and pneumonia on chest x-ray.  Lower suspicion for PE.  Will admit to hospitalist.  This chart was dictated using voice recognition software.  Despite best efforts to proofread,   errors can occur which can change the documentation meaning.    Final Clinical Impression(s) / ED Diagnoses Final diagnoses:  Acute respiratory failure with hypoxia ([place])  Lung infection    Rx / DC Orders ED Discharge Orders    None       Lennice Sites, DO 08/08/20 1514

## 2020-08-09 DIAGNOSIS — J189 Pneumonia, unspecified organism: Secondary | ICD-10-CM

## 2020-08-09 LAB — COMPREHENSIVE METABOLIC PANEL
ALT: 41 U/L (ref 0–44)
AST: 40 U/L (ref 15–41)
Albumin: 3.3 g/dL — ABNORMAL LOW (ref 3.5–5.0)
Alkaline Phosphatase: 71 U/L (ref 38–126)
Anion gap: 11 (ref 5–15)
BUN: 15 mg/dL (ref 6–20)
CO2: 26 mmol/L (ref 22–32)
Calcium: 8.3 mg/dL — ABNORMAL LOW (ref 8.9–10.3)
Chloride: 103 mmol/L (ref 98–111)
Creatinine, Ser: 0.63 mg/dL (ref 0.44–1.00)
GFR, Estimated: >60 mL/min (ref >60)
Glucose, Bld: 165 mg/dL — ABNORMAL HIGH (ref 70–99)
Potassium: 3.1 mmol/L — ABNORMAL LOW (ref 3.5–5.1)
Sodium: 140 mmol/L (ref 135–145)
Total Bilirubin: 0.4 mg/dL (ref 0.3–1.2)
Total Protein: 7.3 g/dL (ref 6.5–8.1)

## 2020-08-09 LAB — CBC WITH DIFFERENTIAL/PLATELET
Abs Immature Granulocytes: 0.01 10*3/uL (ref 0.00–0.07)
Basophils Absolute: 0 10*3/uL (ref 0.0–0.1)
Basophils Relative: 0 %
Eosinophils Absolute: 0 10*3/uL (ref 0.0–0.5)
Eosinophils Relative: 0 %
HCT: 36.3 % (ref 36.0–46.0)
Hemoglobin: 11.3 g/dL — ABNORMAL LOW (ref 12.0–15.0)
Immature Granulocytes: 0 %
Lymphocytes Relative: 22 %
Lymphs Abs: 1 10*3/uL (ref 0.7–4.0)
MCH: 22.3 pg — ABNORMAL LOW (ref 26.0–34.0)
MCHC: 31.1 g/dL (ref 30.0–36.0)
MCV: 71.7 fL — ABNORMAL LOW (ref 80.0–100.0)
Monocytes Absolute: 0.1 10*3/uL (ref 0.1–1.0)
Monocytes Relative: 3 %
Neutro Abs: 3.4 10*3/uL (ref 1.7–7.7)
Neutrophils Relative %: 75 %
Platelets: 311 10*3/uL (ref 150–400)
RBC: 5.06 MIL/uL (ref 3.87–5.11)
RDW: 17.3 % — ABNORMAL HIGH (ref 11.5–15.5)
WBC: 4.5 10*3/uL (ref 4.0–10.5)
nRBC: 0 % (ref 0.0–0.2)

## 2020-08-09 LAB — FERRITIN: Ferritin: 133 ng/mL (ref 11–307)

## 2020-08-09 LAB — D-DIMER, QUANTITATIVE: D-Dimer, Quant: 1.05 ug/mL-FEU — ABNORMAL HIGH (ref 0.00–0.50)

## 2020-08-09 LAB — C-REACTIVE PROTEIN: CRP: 3.2 mg/dL — ABNORMAL HIGH (ref <1.0)

## 2020-08-09 LAB — PHOSPHORUS: Phosphorus: 1.6 mg/dL — ABNORMAL LOW (ref 2.5–4.6)

## 2020-08-09 LAB — MAGNESIUM: Magnesium: 2.2 mg/dL (ref 1.7–2.4)

## 2020-08-09 MED ORDER — METHYLPREDNISOLONE SODIUM SUCC 40 MG IJ SOLR
40.0000 mg | Freq: Two times a day (BID) | INTRAMUSCULAR | Status: DC
  Administered 2020-08-09 – 2020-08-10 (×3): 40 mg via INTRAVENOUS
  Filled 2020-08-09 (×3): qty 1

## 2020-08-09 MED ORDER — POTASSIUM CHLORIDE CRYS ER 20 MEQ PO TBCR
40.0000 meq | EXTENDED_RELEASE_TABLET | Freq: Two times a day (BID) | ORAL | Status: AC
  Administered 2020-08-09 (×2): 40 meq via ORAL
  Filled 2020-08-09 (×2): qty 2

## 2020-08-09 NOTE — Plan of Care (Signed)
  Problem: Education: Goal: Knowledge of General Education information will improve Description: Including pain rating scale, medication(s)/side effects and non-pharmacologic comfort measures 08/09/2020 1544 by Thornton Papas, RN Outcome: Progressing 08/09/2020 1543 by Thornton Papas, RN Outcome: Progressing   Problem: Health Behavior/Discharge Planning: Goal: Ability to manage health-related needs will improve 08/09/2020 1544 by Thornton Papas, RN Outcome: Progressing 08/09/2020 1543 by Thornton Papas, RN Outcome: Progressing   Problem: Clinical Measurements: Goal: Ability to maintain clinical measurements within normal limits will improve 08/09/2020 1544 by Thornton Papas, RN Outcome: Progressing 08/09/2020 1543 by Thornton Papas, RN Outcome: Progressing Goal: Will remain free from infection 08/09/2020 1544 by Thornton Papas, RN Outcome: Progressing 08/09/2020 1543 by Thornton Papas, RN Outcome: Progressing Goal: Diagnostic test results will improve 08/09/2020 1544 by Thornton Papas, RN Outcome: Progressing 08/09/2020 1543 by Thornton Papas, RN Outcome: Progressing Goal: Respiratory complications will improve 08/09/2020 1544 by Thornton Papas, RN Outcome: Progressing 08/09/2020 1543 by Thornton Papas, RN Outcome: Progressing Goal: Cardiovascular complication will be avoided 08/09/2020 1544 by Thornton Papas, RN Outcome: Progressing 08/09/2020 1543 by Thornton Papas, RN Outcome: Progressing   Problem: Activity: Goal: Risk for activity intolerance will decrease 08/09/2020 1544 by Thornton Papas, RN Outcome: Progressing 08/09/2020 1543 by Thornton Papas, RN Outcome: Progressing   Problem: Nutrition: Goal: Adequate nutrition will be maintained 08/09/2020 1544 by Thornton Papas, RN Outcome: Progressing 08/09/2020 1543 by Thornton Papas, RN Outcome: Progressing   Problem: Coping: Goal: Level of anxiety will decrease 08/09/2020 1544 by Thornton Papas, RN Outcome:  Progressing 08/09/2020 1543 by Thornton Papas, RN Outcome: Progressing   Problem: Elimination: Goal: Will not experience complications related to bowel motility 08/09/2020 1544 by Thornton Papas, RN Outcome: Progressing 08/09/2020 1543 by Thornton Papas, RN Outcome: Progressing Goal: Will not experience complications related to urinary retention 08/09/2020 1544 by Thornton Papas, RN Outcome: Progressing 08/09/2020 1543 by Thornton Papas, RN Outcome: Progressing   Problem: Pain Managment: Goal: General experience of comfort will improve 08/09/2020 1544 by Thornton Papas, RN Outcome: Progressing 08/09/2020 1543 by Thornton Papas, RN Outcome: Progressing   Problem: Safety: Goal: Ability to remain free from injury will improve 08/09/2020 1544 by Thornton Papas, RN Outcome: Progressing 08/09/2020 1543 by Thornton Papas, RN Outcome: Progressing   Problem: Skin Integrity: Goal: Risk for impaired skin integrity will decrease 08/09/2020 1544 by Thornton Papas, RN Outcome: Progressing 08/09/2020 1543 by Thornton Papas, RN Outcome: Progressing   Problem: Education: Goal: Knowledge of risk factors and measures for prevention of condition will improve 08/09/2020 1544 by Thornton Papas, RN Outcome: Progressing 08/09/2020 1543 by Thornton Papas, RN Outcome: Progressing   Problem: Coping: Goal: Psychosocial and spiritual needs will be supported 08/09/2020 1544 by Thornton Papas, RN Outcome: Progressing 08/09/2020 1543 by Thornton Papas, RN Outcome: Progressing   Problem: Respiratory: Goal: Will maintain a patent airway 08/09/2020 1544 by Thornton Papas, RN Outcome: Progressing 08/09/2020 1543 by Thornton Papas, RN Outcome: Progressing Goal: Complications related to the disease process, condition or treatment will be avoided or minimized 08/09/2020 1544 by Thornton Papas, RN Outcome: Progressing 08/09/2020 1543 by Thornton Papas, RN Outcome: Progressing

## 2020-08-09 NOTE — Plan of Care (Signed)

## 2020-08-09 NOTE — Progress Notes (Signed)
PROGRESS NOTE    Theresa Pearson  PXT:[id number] DOB: 28-Nov-1965 DOA: 08/08/2020 PCP: Antony Blackbird, MD (Inactive)     Brief Narrative:  Theresa Pearson is a 55 y.o. female with HTN, prediabetes, GERD, anxiety/fibromyalgia, ankylosing spondylitis, h/o DVT in 1994 who presented with worsening dyspnea. 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  Symptoms marked by extreme exhaustion and cough that was initially dry but now productive of greenish phlegm.  She also reports subjective fever and chills. Has been trying to self medicate with Mucinex and other OTC meds at home However dyspnea got worse last night therefore decided to come to the ED. She tested positive for COVID-19.  New events last 24 hours / Subjective: States that she is able to take deeper breaths now.  She is refusing to take cephalosporin due to her penicillin allergy.  Assessment & Plan:   Active Problems:   Hypokalemia   HTN (hypertension)   GAD (generalized anxiety disorder)   Multifocal pneumonia   Sepsis due to pneumococcus (HCC)   Prolonged QT interval   Acute hypoxemic respiratory failure secondary to COVID-19  -In the emergency department, she was found to be hypoxic on ambulation in the room.  Continue 2 L O2, wean as able -CT chest: Patchy peripheral ground-glass opacities concerning for atypical/viral pneumonia  COVID-19 Pneumonia -Tested positive on 1/25 -She was not started on Remdesivir as she is almost 3 weeks out from onset of symptoms -Continue steroids 1/25 >>  -Initially started on Rocephin and azithromycin due to concern for bacterial superinfection.  Patient refusing cephalosporins due to its relation with her penicillin allergy.  Continue doxycycline -Supportive treatments as ordered, encourage IS, encourage prone positioning, encourage mobilization   COVID-19 Labs  Recent Labs    08/08/20 1336 08/08/20 1342  08/09/20 0427  DDIMER 1.03*  --  1.05*  FERRITIN  --  137 133  LDH 207*  --   --   CRP  --  4.9* 3.2*    Lab Results  Component Value Date   SARSCOV2NAA POSITIVE (A) 08/08/2020    Hypokalemia -Replace, trend  Hypertension -Continue lisinopril, metoprolol, chlorthalidone  GERD -Continue PPI  Depression/anxiety -Continue Lexapro, Atarax as needed    DVT prophylaxis:  enoxaparin (LOVENOX) injection 40 mg Start: 08/08/20 2200  Code Status: Full code Family Communication: None at bedside Disposition Plan:  Status is: Inpatient  Remains inpatient appropriate because:Inpatient level of care appropriate due to severity of illness   Dispo: The patient is from: Home              Anticipated d/c is to: Home              Anticipated d/c date is: 2 days              Patient currently is not medically stable to d/c.   Difficult to place patient No   Antimicrobials:  Anti-infectives (From admission, onward)   Start     Dose/Rate Route Frequency Ordered Stop   08/08/20 2000  cefTRIAXone (ROCEPHIN) 1 g in sodium chloride 0.9 % 100 mL IVPB  Status:  Discontinued        1 g 200 mL/hr over 30 Minutes Intravenous Every 24 hours 08/08/20 1910 08/09/20 1533   08/08/20 1830  cefTRIAXone (ROCEPHIN) 1 g in sodium chloride 0.9 % 100 mL IVPB  Status:  Discontinued        1 g 200 mL/hr over 30  Minutes Intravenous Every 24 hours 08/08/20 1711 08/08/20 1908   08/08/20 1830  doxycycline (VIBRA-TABS) tablet 100 mg        100 mg Oral Every 12 hours 08/08/20 1736     08/08/20 1715  azithromycin (ZITHROMAX) 500 mg in sodium chloride 0.9 % 250 mL IVPB  Status:  Discontinued        500 mg 250 mL/hr over 60 Minutes Intravenous Every 24 hours 08/08/20 1711 08/08/20 1736        Objective: Vitals:   08/09/20 0852 08/09/20 0908 08/09/20 0910 08/09/20 1520  BP: 118/74 135/88  (!) 138/91  Pulse: 68  60 78  Resp: (!) 24   (!) 24  Temp: 97.8 F (36.6 C)   98.3 F (36.8 C)  TempSrc: Oral    Oral  SpO2: 93%   96%    Intake/Output Summary (Last 24 hours) at 08/09/2020 1534 Last data filed at 08/09/2020 1521 Gross per 24 hour  Intake 1617 ml  Output --  Net 1617 ml   There were no vitals filed for this visit.  Examination:  General exam: Appears calm and comfortable  Respiratory system: Clear to auscultation. Respiratory effort normal. No respiratory distress. No conversational dyspnea.  On 2 L nasal cannula O2 Cardiovascular system: S1 & S2 heard, RRR. No murmurs. No pedal edema. Gastrointestinal system: Abdomen is nondistended, soft and nontender. Normal bowel sounds heard. Central nervous system: Alert and oriented. No focal neurological deficits. Speech clear.  Extremities: Symmetric in appearance  Skin: No rashes, lesions or ulcers on exposed skin  Psychiatry: Judgement and insight appear normal. Mood & affect appropriate.   Data Reviewed: I have personally reviewed following labs and imaging studies  CBC: Recent Labs  Lab 08/08/20 1336 08/09/20 0427  WBC 5.6 4.5  NEUTROABS 3.7 3.4  HGB 12.0 11.3*  HCT 39.1 36.3  MCV 71.4* 71.7*  PLT 308 [id number]   Basic Metabolic Panel: Recent Labs  Lab 08/08/20 1336 08/08/20 2038 08/09/20 0427  NA 139 138 140  K 2.8* 3.5 3.1*  CL 96* 101 103  CO2 28 27 26   GLUCOSE 107* 169* 165*  BUN 15 17 15   CREATININE 0.85 0.83 0.63  CALCIUM 8.5* 8.0* 8.3*  MG  --   --  2.2  PHOS  --   --  1.6*   GFR: CrCl cannot be calculated (Unknown ideal weight.). Liver Function Tests: Recent Labs  Lab 08/08/20 1336 08/09/20 0427  AST 45* 40  ALT 42 41  ALKPHOS 78 71  BILITOT 0.5 0.4  PROT 8.1 7.3  ALBUMIN 3.7 3.3*   Recent Labs  Lab 08/08/20 2038  LIPASE 27   No results for input(s): AMMONIA in the last 168 hours. Coagulation Profile: No results for input(s): INR, PROTIME in the last 168 hours. Cardiac Enzymes: No results for input(s): CKTOTAL, CKMB, CKMBINDEX, TROPONINI in the last 168 hours. BNP (last 3 results) No  results for input(s): PROBNP in the last 8760 hours. HbA1C: No results for input(s): HGBA1C in the last 72 hours. CBG: No results for input(s): GLUCAP in the last 168 hours. Lipid Profile: Recent Labs    08/08/20 1336  TRIG 129   Thyroid Function Tests: No results for input(s): TSH, T4TOTAL, FREET4, T3FREE, THYROIDAB in the last 72 hours. Anemia Panel: Recent Labs    08/08/20 1342 08/09/20 0427  FERRITIN 137 133   Sepsis Labs: Recent Labs  Lab 08/08/20 1336  PROCALCITON <0.10  LATICACIDVEN 1.3    Recent Results (from  the past 240 hour(s))  SARS Coronavirus 2 by RT PCR (hospital order, performed in [facility] hospital lab) Nasopharyngeal Nasopharyngeal Swab     Status: Abnormal   Collection Time: 08/08/20  1:36 PM   Specimen: Nasopharyngeal Swab  Result Value Ref Range Status   SARS Coronavirus 2 POSITIVE (A) NEGATIVE Final    Comment: RESULT CALLED TO, READ BACK BY AND VERIFIED WITH: S.WEST AT 1633 ON 08/08/20 BY N.THOMPSON (NOTE) SARS-CoV-2 target nucleic acids are DETECTED  SARS-CoV-2 RNA is generally detectable in upper respiratory specimens  during the acute phase of infection.  Positive results are indicative  of the presence of the identified virus, but do not rule out bacterial infection or co-infection with other pathogens not detected by the test.  Clinical correlation with patient history and  other diagnostic information is necessary to determine patient infection status.  The expected result is negative.  Fact Sheet for Patients:   StrictlyIdeas.no   Fact Sheet for Healthcare Providers:   BankingDealers.co.za    This test is not yet approved or cleared by the [place] FDA and  has been authorized for detection and/or diagnosis of SARS-CoV-2 by FDA under an Emergency Use Authorization (EUA).  This EUA will remain in effect (meaning th is test can be used) for the duration of  the COVID-19  declaration under Section 564(b)(1) of the Act, 21 U.S.C. section 360-bbb-3(b)(1), unless the authorization is terminated or revoked sooner.  Performed at [facility], [place] [address] [place], [place] [address]   Blood Culture (routine x 2)     Status: None (Preliminary result)   Collection Time: 08/08/20  1:41 PM   Specimen: BLOOD  Result Value Ref Range Status   Specimen Description   Final    BLOOD LEFT ANTECUBITAL Performed at [facility] [address], [place], [place] [address]    Special Requests   Final    BOTTLES DRAWN AEROBIC AND ANAEROBIC Blood Culture adequate volume Performed at [facility] [address] [place], [place] [address]    Culture   Final    NO GROWTH < 24 HOURS Performed at [facility] [address], [place], [place] [address]    Report Status PENDING  Incomplete  Blood Culture (routine x 2)     Status: None (Preliminary result)   Collection Time: 08/08/20  1:42 PM   Specimen: BLOOD  Result Value Ref Range Status   Specimen Description   Final    BLOOD RIGHT WRIST Performed at [facility] [address] [place], [place] [address]    Special Requests   Final    BOTTLES DRAWN AEROBIC ONLY Blood Culture results may not be optimal due to an inadequate volume of blood received in culture bottles Performed at [facility] [address] [place], [place] [address]    Culture   Final    NO GROWTH < 24 HOURS Performed at [facility] [address] [place] [address], [place], [place] [address]    Report Status PENDING  Incomplete      Radiology Studies: DG Chest 2 View  Result Date: 08/07/2020 CLINICAL DATA:  Shortness of breath EXAM: CHEST - 2 VIEW COMPARISON:  08/06/2016 FINDINGS: Streaky perihilar opacities with patchy left lung base ground-glass opacity. No pleural effusion. Normal heart size. No pneumothorax. IMPRESSION: Streaky perihilar  opacities with patchy left lung base ground-glass opacity, suspicious for pneumonia, possible atypical or viral pneumonia Electronically Signed  By: Donavan Foil M.D.   On: 08/07/2020 23:21   CT Chest Wo Contrast  Result Date: 08/08/2020 CLINICAL DATA:  Shortness of breath, cough EXAM: CT CHEST WITHOUT CONTRAST TECHNIQUE: Multidetector CT imaging of the chest was performed following the standard protocol without IV contrast. COMPARISON:  Chest x-ray 08/07/2020 FINDINGS: Cardiovascular: Heart is normal size. Aorta is normal caliber. Mediastinum/Nodes: Borderline scattered mediastinal lymph nodes. No visible axillary or hilar adenopathy. Lungs/Pleura: Patchy bilateral predominantly peripheral ground-glass airspace opacities are noted concerning for atypical infection. No effusions. Upper Abdomen: Imaging into the upper abdomen demonstrates no acute findings. Musculoskeletal: Chest wall soft tissues are unremarkable. No acute bony abnormality. IMPRESSION: Patchy peripheral ground-glass opacities concerning for atypical/viral pneumonia. COVID pneumonia could have this appearance. Electronically Signed   By: Rolm Baptise M.D.   On: 08/08/2020 17:21      Scheduled Meds: . benzonatate  100 mg Oral TID  . chlorthalidone  50 mg Oral Daily  . doxycycline  100 mg Oral Q12H  . enoxaparin (LOVENOX) injection  40 mg Subcutaneous Q24H  . escitalopram  20 mg Oral Daily  . guaiFENesin  600 mg Oral BID  . ipratropium  2 puff Inhalation Q6H  . lisinopril  40 mg Oral Daily  . methylPREDNISolone (SOLU-MEDROL) injection  40 mg Intravenous Q12H  . metoprolol tartrate  50 mg Oral BID  . pantoprazole  40 mg Oral Daily  . potassium chloride  40 mEq Oral BID   Continuous Infusions: . magnesium sulfate bolus IVPB       LOS: 1 day      Time spent: 35 minutes   Dessa Phi, DO [facility] 08/09/2020, 3:34 PM   Available via Epic secure chat 7am-7pm After these hours, please refer to coverage  provider listed on [email or web address]

## 2020-08-10 ENCOUNTER — Other Ambulatory Visit (HOSPITAL_COMMUNITY): Payer: Self-pay | Admitting: Internal Medicine

## 2020-08-10 DIAGNOSIS — U071 COVID-19: Secondary | ICD-10-CM

## 2020-08-10 LAB — COMPREHENSIVE METABOLIC PANEL
ALT: 31 U/L (ref 0–44)
AST: 27 U/L (ref 15–41)
Albumin: 3.4 g/dL — ABNORMAL LOW (ref 3.5–5.0)
Alkaline Phosphatase: 71 U/L (ref 38–126)
Anion gap: 9 (ref 5–15)
BUN: 18 mg/dL (ref 6–20)
CO2: 23 mmol/L (ref 22–32)
Calcium: 8.6 mg/dL — ABNORMAL LOW (ref 8.9–10.3)
Chloride: 108 mmol/L (ref 98–111)
Creatinine, Ser: 0.67 mg/dL (ref 0.44–1.00)
GFR, Estimated: >60 mL/min (ref >60)
Glucose, Bld: 188 mg/dL — ABNORMAL HIGH (ref 70–99)
Potassium: 3.7 mmol/L (ref 3.5–5.1)
Sodium: 140 mmol/L (ref 135–145)
Total Bilirubin: 0.4 mg/dL (ref 0.3–1.2)
Total Protein: 7.3 g/dL (ref 6.5–8.1)

## 2020-08-10 LAB — CBC WITH DIFFERENTIAL/PLATELET
Abs Immature Granulocytes: 0.05 10*3/uL (ref 0.00–0.07)
Basophils Absolute: 0 10*3/uL (ref 0.0–0.1)
Basophils Relative: 0 %
Eosinophils Absolute: 0 10*3/uL (ref 0.0–0.5)
Eosinophils Relative: 0 %
HCT: 36.5 % (ref 36.0–46.0)
Hemoglobin: 11.3 g/dL — ABNORMAL LOW (ref 12.0–15.0)
Immature Granulocytes: 1 %
Lymphocytes Relative: 10 %
Lymphs Abs: 1 10*3/uL (ref 0.7–4.0)
MCH: 22.4 pg — ABNORMAL LOW (ref 26.0–34.0)
MCHC: 31 g/dL (ref 30.0–36.0)
MCV: 72.3 fL — ABNORMAL LOW (ref 80.0–100.0)
Monocytes Absolute: 0.2 10*3/uL (ref 0.1–1.0)
Monocytes Relative: 2 %
Neutro Abs: 8.6 10*3/uL — ABNORMAL HIGH (ref 1.7–7.7)
Neutrophils Relative %: 87 %
Platelets: 385 10*3/uL (ref 150–400)
RBC: 5.05 MIL/uL (ref 3.87–5.11)
RDW: 17.6 % — ABNORMAL HIGH (ref 11.5–15.5)
WBC: 9.8 10*3/uL (ref 4.0–10.5)
nRBC: 0 % (ref 0.0–0.2)

## 2020-08-10 LAB — C-REACTIVE PROTEIN: CRP: 1.2 mg/dL — ABNORMAL HIGH (ref <1.0)

## 2020-08-10 LAB — D-DIMER, QUANTITATIVE: D-Dimer, Quant: 0.92 ug/mL-FEU — ABNORMAL HIGH (ref 0.00–0.50)

## 2020-08-10 LAB — FERRITIN: Ferritin: 115 ng/mL (ref 11–307)

## 2020-08-10 LAB — MAGNESIUM: Magnesium: 2.1 mg/dL (ref 1.7–2.4)

## 2020-08-10 MED ORDER — DOXYCYCLINE HYCLATE 100 MG PO TABS: 100.0000 mg | ORAL_TABLET | Freq: Two times a day (BID) | ORAL | 0 refills | Status: DC

## 2020-08-10 MED ORDER — DEXAMETHASONE 6 MG PO TABS: 6.0000 mg | ORAL_TABLET | Freq: Every day | ORAL | 0 refills | Status: AC

## 2020-08-10 MED FILL — DEXAMETHASONE 4 MG TABLET: 4 | 7 days supply | Qty: 11 | Fill #0

## 2020-08-10 MED FILL — DOXYCYCLINE HYCLATE 100 MG: 100 | 2 days supply | Qty: 4 | Fill #0

## 2020-08-10 NOTE — TOC Transition Note (Addendum)
Transition of Care [facility]) - CM/SW Discharge Note   Patient Details  Name: Sherina Stammer MRN: [id number] Date of Birth: [birth year]  [facility] CM/SW Contact:  Trish Mage, LCSW Phone Number: 08/10/2020, 1:40 PM   Clinical Narrative:   Patient who is stable for discharge is in need of home O2.  SAT note and orders seen and appreciated.  Contacted Zach with [facility] who will arrange for delivery of home unit, travel canister.  Patient needs a ride home. Contacted [facility]. Awaiting call back re: ride availability.    TOC will continue to follow during the course of hospitalization.  [facility]  [facility] will provide transportation  home. RN will be contacted when they arrive.     Final next level of care: Home/Self Care Barriers to Discharge: No Barriers Identified   Patient Goals and CMS Choice        Discharge Placement                       Discharge Plan and Services                                     Social Determinants of Health (SDOH) Interventions     Readmission Risk Interventions No flowsheet data found.

## 2020-08-10 NOTE — Discharge Summary (Signed)
Physician Discharge Summary  Theresa Pearson [id number] DOB: 1965-09-12 DOA: 08/08/2020  PCP: Antony Blackbird, MD (Inactive)  Admit date: 08/08/2020 Discharge date: 08/10/2020  Admitted From: Home Disposition: Home  Recommendations for Outpatient Follow-up:  1. Follow up with PCP in 1 week  Discharge Condition: Stable CODE STATUS: Full code Diet recommendation:  Diet Orders (From admission, onward)    Start     Ordered   08/08/20 2044  Diet regular Room service appropriate? Yes; Fluid consistency: Thin  Diet effective now       Comments: Pt is Pescatarian so no meat except FISH only.  Question Answer Comment  Room service appropriate? Yes   Fluid consistency: Thin      08/08/20 2043          Brief/Interim Summary: Sherrel Reder a 55 y.o.femalewithHTN, prediabetes, GERD, anxiety/fibromyalgia, ankylosing spondylitis, h/o DVT in 1994who presented with worsening dyspnea. 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 Symptoms marked by extreme exhaustion and cough that was initially dry but now productive of greenish phlegm. She also reports subjective fever and chills. Has been trying to self medicate with Mucinex and other OTC meds at home However dyspnea got worse last night therefore decided to come to the ED. She tested positive for COVID-19.  She was treated with steroids as well as antibiotics for concern for bacterial superinfection.  She was set up with home oxygen for discharge.  She was discharged in stable condition, symptoms improved throughout her hospital stay as well as inflammatory markers.  Discharge Diagnoses:  Active Problems:   Hypokalemia   HTN (hypertension)   GAD (generalized anxiety disorder)   Multifocal pneumonia   Sepsis due to pneumococcus (HCC)   Prolonged QT interval  Acute hypoxemic respiratory failure secondary to COVID-19  -In the emergency department, she was found to  be hypoxic on ambulation in the room.  Continue 2 L O2, set up for home oxygen -CT chest: Patchy peripheral ground-glass opacities concerning for atypical/viral pneumonia  COVID-19 Pneumonia -Tested positive on 1/25 -She was not started on Remdesivir as she is almost 3 weeks out from onset of symptoms -Continue steroids 1/25 >> to complete total 10-day course -Initially started on Rocephin and azithromycin due to concern for bacterial superinfection.  Patient refusing cephalosporins due to its relation with her penicillin allergy.  Continue doxycycline to complete total 5-day course -Supportive treatments as ordered, encourage IS, encourage prone positioning, encourage mobilization   Hypertension -Continue lisinopril, metoprolol, chlorthalidone  GERD -Continue PPI  Depression/anxiety -Continue Lexapro, Atarax as needed  Discharge Instructions  Discharge Instructions    Call MD for:  difficulty breathing, headache or visual disturbances   Complete by: As directed    Call MD for:  extreme fatigue   Complete by: As directed    Call MD for:  persistant dizziness or light-headedness   Complete by: As directed    Call MD for:  persistant nausea and vomiting   Complete by: As directed    Call MD for:  severe uncontrolled pain   Complete by: As directed    Call MD for:  temperature >100.4   Complete by: As directed    Discharge instructions   Complete by: As directed    You were cared for by a hospitalist during your hospital stay. If you have any questions about your discharge medications or the care you received while you were in the hospital after you are discharged, you can call the  unit and ask to speak with the hospitalist on call if the hospitalist that took care of you is not available. Once you are discharged, your primary care physician will handle any further medical issues. Please note that NO REFILLS for any discharge medications will be authorized once you are discharged,  as it is imperative that you return to your primary care physician (or establish a relationship with a primary care physician if you do not have one) for your aftercare needs so that they can reassess your need for medications and monitor your lab values.   Increase activity slowly   Complete by: As directed      Allergies as of 08/10/2020      Reactions   Penicillins Shortness Of Breath, Rash      Medication List    STOP taking these medications   naproxen 500 MG tablet Commonly known as: NAPROSYN     TAKE these medications   acetaminophen 500 MG tablet Commonly known as: TYLENOL Take 500 mg by mouth every 6 (six) hours as needed for mild pain.   albuterol 108 (90 Base) MCG/ACT inhaler Commonly known as: VENTOLIN HFA Inhale 2 puffs into the lungs every 6 (six) hours as needed for wheezing or shortness of breath.   chlorthalidone 50 MG tablet Commonly known as: HYGROTON Take 1 tablet (50 mg total) by mouth daily.   cholecalciferol 25 MCG (1000 UNIT) tablet Commonly known as: VITAMIN D3 Take 1,000 Units by mouth daily.   dexamethasone 6 MG tablet Commonly known as: DECADRON Take 1 tablet (6 mg total) by mouth daily for 7 days.   diclofenac sodium 1 % Gel Commonly known as: Voltaren Apply 2 g topically 4 (four) times daily. What changed:   when to take this  reasons to take this   dicyclomine 10 MG capsule Commonly known as: BENTYL Take 1-2 capsules (10-20 mg total) by mouth every 8 (eight) hours as needed for spasms.   doxycycline 100 MG tablet Commonly known as: VIBRA-TABS Take 1 tablet (100 mg total) by mouth every 12 (twelve) hours for 2 days.   escitalopram 20 MG tablet Commonly known as: LEXAPRO Take 1 tablet (20 mg total) by mouth daily.   hydrOXYzine 25 MG tablet Commonly known as: ATARAX/VISTARIL 1/2 to 1 tid prn anxiety What changed:   how much to take  how to take this  when to take this  reasons to take this   lisinopril 40 MG  tablet Commonly known as: ZESTRIL Take 1 tablet (40 mg total) by mouth daily.   metoprolol tartrate 50 MG tablet Commonly known as: LOPRESSOR TAKE 1 TABLET(50 MG) BY MOUTH TWICE DAILY   MULTIVITAMIN PO Take 1 tablet by mouth daily.   omeprazole 20 MG capsule Commonly known as: PRILOSEC Take 1 capsule (20 mg total) by mouth daily.   potassium chloride SA 20 MEQ tablet Commonly known as: KLOR-CON Take 1 tablet (20 mEq total) by mouth 2 (two) times daily.   vitamin B-12 1000 MCG tablet Commonly known as: CYANOCOBALAMIN Take 1,000 mcg by mouth daily.   zinc gluconate 50 MG tablet Take 50 mg by mouth daily.            Durable Medical Equipment  (From admission, onward)         Start     Ordered   08/10/20 1336  For home use only DME oxygen  Once       Question Answer Comment  Length of Need 6 Months  Mode or (Route) Nasal cannula   Liters per Minute 2   Frequency Continuous (stationary and portable oxygen unit needed)   Oxygen conserving device Yes   Oxygen delivery system Gas      08/10/20 1336          Follow-up Information    Fulp, Cammie, MD. Schedule an appointment as soon as possible for a visit in 1 week(s).   Specialty: Family Medicine Contact information: [address] [place] [address] [phone]              Allergies  Allergen Reactions  . Penicillins Shortness Of Breath and Rash    Procedures/Studies: DG Chest 2 View  Result Date: 08/07/2020 CLINICAL DATA:  Shortness of breath EXAM: CHEST - 2 VIEW COMPARISON:  08/06/2016 FINDINGS: Streaky perihilar opacities with patchy left lung base ground-glass opacity. No pleural effusion. Normal heart size. No pneumothorax. IMPRESSION: Streaky perihilar opacities with patchy left lung base ground-glass opacity, suspicious for pneumonia, possible atypical or viral pneumonia Electronically Signed   By: Donavan Foil M.D.   On: 08/07/2020 23:21   CT Chest Wo Contrast  Result Date:  08/08/2020 CLINICAL DATA:  Shortness of breath, cough EXAM: CT CHEST WITHOUT CONTRAST TECHNIQUE: Multidetector CT imaging of the chest was performed following the standard protocol without IV contrast. COMPARISON:  Chest x-ray 08/07/2020 FINDINGS: Cardiovascular: Heart is normal size. Aorta is normal caliber. Mediastinum/Nodes: Borderline scattered mediastinal lymph nodes. No visible axillary or hilar adenopathy. Lungs/Pleura: Patchy bilateral predominantly peripheral ground-glass airspace opacities are noted concerning for atypical infection. No effusions. Upper Abdomen: Imaging into the upper abdomen demonstrates no acute findings. Musculoskeletal: Chest wall soft tissues are unremarkable. No acute bony abnormality. IMPRESSION: Patchy peripheral ground-glass opacities concerning for atypical/viral pneumonia. COVID pneumonia could have this appearance. Electronically Signed   By: Rolm Baptise M.D.   On: 08/08/2020 17:21      Discharge Exam: Vitals:   08/09/20 2035 08/10/20 0433  BP: 139/69 132/75  Pulse: 68 65  Resp: 19 (!) 24  Temp: 98.2 F (36.8 C) 97.6 F (36.4 C)  SpO2: 95% 94%    General: Pt is alert, awake, not in acute distress Cardiovascular: RRR, S1/S2 +, no edema Respiratory: CTA bilaterally, no wheezing, no rhonchi, no respiratory distress, no conversational dyspnea  Abdominal: Soft, NT, ND, bowel sounds + Extremities: no edema, no cyanosis Psych: Normal mood and affect, stable judgement and insight     The results of significant diagnostics from this hospitalization (including imaging, microbiology, ancillary and laboratory) are listed below for reference.     Microbiology: Recent Results (from the past 240 hour(s))  SARS Coronavirus 2 by RT PCR (hospital order, performed in [facility] hospital lab) Nasopharyngeal Nasopharyngeal Swab     Status: Abnormal   Collection Time: 08/08/20  1:36 PM   Specimen: Nasopharyngeal Swab  Result Value Ref Range Status   SARS  Coronavirus 2 POSITIVE (A) NEGATIVE Final    Comment: RESULT CALLED TO, READ BACK BY AND VERIFIED WITH: S.WEST AT 1633 ON 08/08/20 BY N.THOMPSON (NOTE) SARS-CoV-2 target nucleic acids are DETECTED  SARS-CoV-2 RNA is generally detectable in upper respiratory specimens  during the acute phase of infection.  Positive results are indicative  of the presence of the identified virus, but do not rule out bacterial infection or co-infection with other pathogens not detected by the test.  Clinical correlation with patient history and  other diagnostic information is necessary to determine patient infection status.  The expected result is  negative.  Fact Sheet for Patients:   StrictlyIdeas.no   Fact Sheet for Healthcare Providers:   BankingDealers.co.za    This test is not yet approved or cleared by the [place] FDA and  has been authorized for detection and/or diagnosis of SARS-CoV-2 by FDA under an Emergency Use Authorization (EUA).  This EUA will remain in effect (meaning th is test can be used) for the duration of  the COVID-19 declaration under Section 564(b)(1) of the Act, 21 U.S.C. section 360-bbb-3(b)(1), unless the authorization is terminated or revoked sooner.  Performed at [facility], [place] [address] [place], [place] [address]   Blood Culture (routine x 2)     Status: None (Preliminary result)   Collection Time: 08/08/20  1:41 PM   Specimen: BLOOD  Result Value Ref Range Status   Specimen Description   Final    BLOOD LEFT ANTECUBITAL Performed at [place] [place] [facility] [address], [place], [place] [address]    Special Requests   Final    BOTTLES DRAWN AEROBIC AND ANAEROBIC Blood Culture adequate volume Performed at [facility] [address] [place], [place] [address]    Culture   Final    NO GROWTH < 24 HOURS Performed at [facility] [address],  [place], [place] [address]    Report Status PENDING  Incomplete  Blood Culture (routine x 2)     Status: None (Preliminary result)   Collection Time: 08/08/20  1:42 PM   Specimen: BLOOD  Result Value Ref Range Status   Specimen Description   Final    BLOOD RIGHT WRIST Performed at [facility] [address], [place], [place] [address]    Special Requests   Final    BOTTLES DRAWN AEROBIC ONLY Blood Culture results may not be optimal due to an inadequate volume of blood received in culture bottles Performed at [facility] [address], [place], [place] [address]    Culture   Final    NO GROWTH < 24 HOURS Performed at [facility] [address] [place],  [address]    Report Status PENDING  Incomplete     Labs: BNP (last 3 results) Recent Labs    08/08/20 2050  BNP 17.3   Basic Metabolic Panel: Recent Labs  Lab 08/08/20 1336 08/08/20 2038 08/09/20 0427 08/10/20 0405  NA 139 138 140 140  K 2.8* 3.5 3.1* 3.7  CL 96* 101 103 108  CO2 28 27 26 23   GLUCOSE 107* 169* 165* 188*  BUN 15 17 15 18   CREATININE 0.85 0.83 0.63 0.67  CALCIUM 8.5* 8.0* 8.3* 8.6*  MG  --   --  2.2 2.1  PHOS  --   --  1.6*  --    Liver Function Tests: Recent Labs  Lab 08/08/20 1336 08/09/20 0427 08/10/20 0405  AST 45* 40 27  ALT 42 41 31  ALKPHOS 78 71 71  BILITOT 0.5 0.4 0.4  PROT 8.1 7.3 7.3  ALBUMIN 3.7 3.3* 3.4*   Recent Labs  Lab 08/08/20 2038  LIPASE 27   No results for input(s): AMMONIA in the last 168 hours. CBC: Recent Labs  Lab 08/08/20 1336 08/09/20 0427 08/10/20 0405  WBC 5.6 4.5 9.8  NEUTROABS 3.7 3.4 8.6*  HGB 12.0 11.3* 11.3*  HCT 39.1 36.3 36.5  MCV 71.4* 71.7* 72.3*  PLT 308 311 385   Cardiac Enzymes: No results for input(s):  CKTOTAL, CKMB, CKMBINDEX, TROPONINI in the last 168 hours. BNP: Invalid input(s): POCBNP CBG: No results for input(s): GLUCAP in the last 168 hours. D-Dimer Recent Labs     08/09/20 0427 08/10/20 0405  DDIMER 1.05* 0.92*   Hgb A1c No results for input(s): HGBA1C in the last 72 hours. Lipid Profile Recent Labs    08/08/20 1336  TRIG 129   Thyroid function studies No results for input(s): TSH, T4TOTAL, T3FREE, THYROIDAB in the last 72 hours.  Invalid input(s): FREET3 Anemia work up Recent Labs    08/09/20 0427 08/10/20 0405  FERRITIN 133 115   Urinalysis    Component Value Date/Time   COLORURINE YELLOW 06/27/2013 [place] 06/27/2013 1730   LABSPEC 1.013 06/27/2013 1730   PHURINE 6.5 06/27/2013 1730   GLUCOSEU NEGATIVE 06/27/2013 1730   HGBUR NEGATIVE 06/27/2013 1730   BILIRUBINUR NEGATIVE 06/27/2013 1730   KETONESUR NEGATIVE 06/27/2013 1730   PROTEINUR NEGATIVE 06/27/2013 1730   UROBILINOGEN 0.2 06/27/2013 1730   NITRITE NEGATIVE 06/27/2013 1730   LEUKOCYTESUR NEGATIVE 06/27/2013 1730   Sepsis Labs Invalid input(s): PROCALCITONIN,  WBC,  LACTICIDVEN Microbiology Recent Results (from the past 240 hour(s))  SARS Coronavirus 2 by RT PCR (hospital order, performed in [facility] hospital lab) Nasopharyngeal Nasopharyngeal Swab     Status: Abnormal   Collection Time: 08/08/20  1:36 PM   Specimen: Nasopharyngeal Swab  Result Value Ref Range Status   SARS Coronavirus 2 POSITIVE (A) NEGATIVE Final    Comment: RESULT CALLED TO, READ BACK BY AND VERIFIED WITH: S.WEST AT 1633 ON 08/08/20 BY N.THOMPSON (NOTE) SARS-CoV-2 target nucleic acids are DETECTED  SARS-CoV-2 RNA is generally detectable in upper respiratory specimens  during the acute phase of infection.  Positive results are indicative  of the presence of the identified virus, but do not rule out bacterial infection or co-infection with other pathogens not detected by the test.  Clinical correlation with patient history and  other diagnostic information is necessary to determine patient infection status.  The expected result is negative.  Fact Sheet for Patients:    StrictlyIdeas.no   Fact Sheet for Healthcare Providers:   BankingDealers.co.za    This test is not yet approved or cleared by the [place] FDA and  has been authorized for detection and/or diagnosis of SARS-CoV-2 by FDA under an Emergency Use Authorization (EUA).  This EUA will remain in effect (meaning th is test can be used) for the duration of  the COVID-19 declaration under Section 564(b)(1) of the Act, 21 U.S.C. section 360-bbb-3(b)(1), unless the authorization is terminated or revoked sooner.  Performed at [facility], [place] [address], [place], [place] [address]   Blood Culture (routine x 2)     Status: None (Preliminary result)   Collection Time: 08/08/20  1:41 PM   Specimen: BLOOD  Result Value Ref Range Status   Specimen Description   Final    BLOOD LEFT ANTECUBITAL Performed at [facility] [address] [place], [place] [address]    Special Requests   Final    BOTTLES DRAWN AEROBIC AND ANAEROBIC Blood Culture adequate volume Performed at [facility] [address], [place], [place] [address]    Culture   Final    NO GROWTH < 24 HOURS Performed at [facility] [address] [place], [place] [address]    Report Status PENDING  Incomplete  Blood Culture (routine x 2)     Status:  None (Preliminary result)   Collection Time: 08/08/20  1:42 PM   Specimen: BLOOD  Result Value Ref Range Status   Specimen Description   Final    BLOOD RIGHT WRIST Performed at [facility] [address] [place], [place] [address]    Special Requests   Final    BOTTLES DRAWN AEROBIC ONLY Blood Culture results may not be optimal due to an inadequate volume of blood received in culture bottles Performed at [facility] [address], [place], [place] [address]    Culture   Final    NO GROWTH < 24 HOURS Performed at [facility] [address] [place], [place] [address]    Report Status PENDING  Incomplete     Patient was seen and examined on the day of discharge and was found to be in stable condition. Time coordinating discharge: 25 minutes including assessment and coordination of care, as well as examination of the patient.   SIGNED:  Dessa Phi, DO [facility] 08/10/2020, 2:05 PM

## 2020-08-10 NOTE — Discharge Instructions (Signed)
You may not return to work/leave the home until at least 21 days since symptom onset AND without a fever (without taking tylenol, ibuprofen, etc.) and without a cough.  Please return to work only after you have been cleared to do so by your primary care physician.  Directions for you at home:  Wear a facemask  You should wear a facemask that covers your nose and mouth when you are in the same room with other people and when you visit a healthcare provider. People who live with or visit you should also wear a facemask while they are in the same room with you.  Separate yourself from other people in your home As much as possible, you should stay in a different room from other people in your home. Also, you should use a separate bathroom, if available.  Avoid sharing household items You should not share dishes, drinking glasses, cups, eating utensils, towels, bedding, or other items with other people in your home. After using these items, you should wash them thoroughly with soap and water.  Cover your coughs and sneezes Cover your mouth and nose with a tissue when you cough or sneeze, or you can cough or sneeze into your sleeve. Throw used tissues in a lined trash can, and immediately wash your hands with soap and water for at least 20 seconds or use an alcohol-based hand rub.  Wash your hands Wash your hands often and thoroughly with soap and water for at least 20 seconds. You can use an alcohol-based hand sanitizer if soap and water are not available and if your hands are not visibly dirty. Avoid touching your eyes, nose, and mouth with unwashed hands.  Directions for those who live with, or provide care at home for you:  Limit the number of people who have contact with the patient  If possible, have only one caregiver for the patient.  Other household members should stay in another home or place of residence. If this is not possible, they should stay  in another room, or be  separated from the patient as much as possible. Use a separate bathroom, if available.  Restrict visitors who do not have an essential need to be in the home.  Ensure good ventilation Make sure that shared spaces in the home have good air flow, such as from an air conditioner or an opened window, weather permitting.  Wash your hands often  Wash your hands often and thoroughly with soap and water for at least 20 seconds. You can use an alcohol based hand sanitizer if soap and water are not available and if your hands are not visibly dirty.  Avoid touching your eyes, nose, and mouth with unwashed hands.  Use disposable paper towels to dry your hands. If not available, use dedicated cloth towels and replace them when they become wet.  Wear a facemask and gloves  Wear a disposable facemask at all times in the room and gloves when you touch or have contact with the patient's blood, body fluids, and/or secretions or excretions, such as sweat, saliva, sputum, nasal mucus, vomit, urine, or feces.  Ensure the mask fits over your nose and mouth tightly, and do not touch it during use.  Throw out disposable facemasks and gloves after using them. Do not reuse.  Wash your hands immediately after removing your facemask and gloves.  If your personal clothing becomes contaminated, carefully remove clothing and launder. Wash your hands after handling contaminated clothing.  Place all used disposable   facemasks, gloves, and other waste in a lined container before disposing them with other household waste.  Remove gloves and wash your hands immediately after handling these items.  Do not share dishes, glasses, or other household items with the patient  Avoid sharing household items. You should not share dishes, drinking glasses, cups, eating utensils, towels, bedding, or other items with a patient who is confirmed to have, or being evaluated for, COVID-19 infection.  After the person uses these items,  you should wash them thoroughly with soap and water.  Wash laundry thoroughly  Immediately remove and wash clothes or bedding that have blood, body fluids, and/or secretions or excretions, such as sweat, saliva, sputum, nasal mucus, vomit, urine, or feces, on them.  Wear gloves when handling laundry from the patient.  Read and follow directions on labels of laundry or clothing items and detergent. In general, wash and dry with the warmest temperatures recommended on the label.  Clean all areas the individual has used often  Clean all touchable surfaces, such as counters, tabletops, doorknobs, bathroom fixtures, toilets, phones, keyboards, tablets, and bedside tables, every day. Also, clean any surfaces that may have blood, body fluids, and/or secretions or excretions on them.  Wear gloves when cleaning surfaces the patient has come in contact with.  Use a diluted bleach solution (e.g., dilute bleach with 1 part bleach and 10 parts water) or a household disinfectant with a label that says EPA-registered for coronaviruses. To make a bleach solution at home, add 1 tablespoon of bleach to 1 quart (4 cups) of water. For a larger supply, add  cup of bleach to 1 gallon (16 cups) of water.  Read labels of cleaning products and follow recommendations provided on product labels. Labels contain instructions for safe and effective use of the cleaning product including precautions you should take when applying the product, such as wearing gloves or eye protection and making sure you have good ventilation during use of the product.  Remove gloves and wash hands immediately after cleaning.  Monitor yourself for signs and symptoms of illness Caregivers and household members are considered close contacts, should monitor their health, and will be asked to limit movement outside of the home to the extent possible. Follow the monitoring steps for close contacts listed on the symptom monitoring form.  

## 2020-08-10 NOTE — Progress Notes (Signed)
Patient will discharge home via transportation with home oxygen. Education on medications will be provided.

## 2020-08-10 NOTE — Progress Notes (Signed)
SATURATION QUALIFICATIONS: (This note is used to comply with regulatory documentation for home oxygen)  Patient Saturations on Room Air at Rest = 89%  Patient Saturations on Room Air while Ambulating = 86%  Patient Saturations on 2 Liters of oxygen while Ambulating = 90%  Please briefly explain why patient needs home oxygen: Patient desats with ambulation. Patient needs to keep oxygen levels >90%.

## 2020-08-11 ENCOUNTER — Telehealth: Payer: Self-pay

## 2020-08-11 NOTE — Telephone Encounter (Signed)
Transition Care Management Follow-up Telephone Call  Date of discharge and from where:[facility] on 08/10/2020 How have you been since you were released from the hospital? [facility]  Any questions or concerns? No questions/concerns reported.  Items Reviewed: Did the pt receive and understand the discharge instructions provided? Stated that have the instructions and have no questions.  Medications obtained and verified? She said that they have the medication list  and the hospital staff reviewed them in detail prior to discharge. She said that he has all of the medications and has  no questions.  Any new allergies since your discharge? None reported  Do you have support at home? Yes,children Other (ie: DME, Home Health, etc)      Discharged on 2L Q2 via nasal canula / Contacted by [employer] Functional Questionnaire: (I = Independent and D = Dependent) ADL's:  Independent.      Follow up appointments reviewed:    PCP Hospital f/u appt confirmed? With MD Theresa Pearson 11:10am  [facility] f/u appt confirmed? None scheduled at this time   Are transportation arrangements needed? Per wife,no. They have transportation    If their condition worsens, is the pt aware to call  their PCP or go to the ED? Yes.Made pt aware if condition worsen or start experiencing rapid weight gain, chest pain, diff breathing, SOB, high fevers, or bleading to refer imediately to ED for further evaluation.   Was the patient provided with contact information for the PCP's office or ED? He has the phone number   Was the pt encouraged to call back with questions or concerns?yes

## 2020-08-13 LAB — CULTURE, BLOOD (ROUTINE X 2)
Culture: NO GROWTH
Culture: NO GROWTH
Special Requests: ADEQUATE

## 2020-08-14 ENCOUNTER — Encounter: Payer: Self-pay | Admitting: Internal Medicine

## 2020-08-14 ENCOUNTER — Other Ambulatory Visit: Payer: Self-pay

## 2020-08-14 ENCOUNTER — Other Ambulatory Visit: Payer: Self-pay | Admitting: Internal Medicine

## 2020-08-14 ENCOUNTER — Ambulatory Visit: Payer: Self-pay | Attending: Internal Medicine | Admitting: Internal Medicine

## 2020-08-14 DIAGNOSIS — I10 Essential (primary) hypertension: Secondary | ICD-10-CM

## 2020-08-14 DIAGNOSIS — J4520 Mild intermittent asthma, uncomplicated: Secondary | ICD-10-CM

## 2020-08-14 DIAGNOSIS — F411 Generalized anxiety disorder: Secondary | ICD-10-CM

## 2020-08-14 DIAGNOSIS — J9601 Acute respiratory failure with hypoxia: Secondary | ICD-10-CM | POA: Insufficient documentation

## 2020-08-14 DIAGNOSIS — U071 COVID-19: Secondary | ICD-10-CM | POA: Insufficient documentation

## 2020-08-14 DIAGNOSIS — Z09 Encounter for follow-up examination after completed treatment for conditions other than malignant neoplasm: Secondary | ICD-10-CM

## 2020-08-14 MED ORDER — CHLORTHALIDONE 50 MG PO TABS: 50.0000 mg | ORAL_TABLET | Freq: Every day | ORAL | 1 refills | Status: DC

## 2020-08-14 MED ORDER — METOPROLOL TARTRATE 50 MG PO TABS: ORAL_TABLET | ORAL | 1 refills | Status: DC

## 2020-08-14 MED ORDER — ALBUTEROL SULFATE HFA 108 (90 BASE) MCG/ACT IN AERS: 2.0000 | INHALATION_SPRAY | Freq: Four times a day (QID) | RESPIRATORY_TRACT | 5 refills | Status: DC | PRN

## 2020-08-14 MED ORDER — LISINOPRIL 40 MG PO TABS: 40.0000 mg | ORAL_TABLET | Freq: Every day | ORAL | 1 refills | Status: DC

## 2020-08-14 MED ORDER — BENZONATATE 100 MG PO CAPS: 100.0000 mg | ORAL_CAPSULE | Freq: Three times a day (TID) | ORAL | 0 refills | Status: DC | PRN

## 2020-08-14 MED ORDER — ESCITALOPRAM OXALATE 20 MG PO TABS: 20.0000 mg | ORAL_TABLET | Freq: Every day | ORAL | 1 refills | Status: DC

## 2020-08-14 MED FILL — ESCITALOPRAM 20 MG TABLET: 20 | 30 days supply | Qty: 30 | Fill #0

## 2020-08-14 MED FILL — LISINOPRIL 40 MG TABLET: 40 | 30 days supply | Qty: 30 | Fill #0

## 2020-08-14 MED FILL — BENZONATATE 100 MG CAPS: 100 | 10 days supply | Qty: 30 | Fill #0

## 2020-08-14 MED FILL — ALBUTEROL SULFATE HFA 108 (: 108 (90 BAS | 25 days supply | Qty: 18 | Fill #0

## 2020-08-14 MED FILL — CHLORTHALIDONE 50 MG TABLET: 50 | 30 days supply | Qty: 30 | Fill #0

## 2020-08-14 MED FILL — METOPROLOL TARTRATE 50 MG T: 50 | 30 days supply | Qty: 60 | Fill #0

## 2020-08-14 NOTE — Progress Notes (Signed)
Virtual Visit via Video Note  I connected with Theresa Pearson on 08/14/20 at 12:06 p.m by a video enabled telemedicine application and verified that I am speaking with the correct person using two identifiers.  Location: Patient: home Provider: office The patient and myself participated in this encounter. I discussed the limitations of evaluation and management by telemedicine and the availability of in person appointments. The patient expressed understanding and agreed to proceed.  History of Present Illness: Patient with history of HTN, prediabetes, anxiety disorder, fibromyalgia, HL, vitamin D deficiency, mild intermittent asthma, mild microcytic anemia  Purpose of today's visit is transition of care. Date of hospitalization 1/25-27/2022 Date of call from case worker/RN: 08/11/2020  Patient hospitalized with worsening dyspnea and cough x3 weeks.  Found to be hypoxic and Covid test positive.  She was not started on remdesivir as she was almost 3 weeks out from the onset of symptoms.  Placed on steroids with plan to complete 10-day course.  She was initially started on Rocephin and azithromycin due to concern for bacterial superinfection.  Patient refused cephalosporins due to its relation with penicillin allergy.  Plan is to continue doxycycline to complete a total of 5 days.  She was discharged home on O2.  Today: Patient reports that she is feeling better.  Still a little short of breath and some cough and wheezing but overall better.  Reports some wheezing and chest tightness at night for which she takes Tylenol and albuterol.  She is requesting refill on albuterol inhaler. She is on 2 L of O2.  Pulse ox at rest is around 95 and with ambulation between 92 to 93%. Patient was not vaccinated against COVID-19.  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 Requesting refill on blood pressure medications.   Outpatient Encounter Medications as of 08/14/2020  Medication Sig   acetaminophen (TYLENOL)  500 MG tablet Take 500 mg by mouth every 6 (six) hours as needed for mild pain.   albuterol (VENTOLIN HFA) 108 (90 Base) MCG/ACT inhaler Inhale 2 puffs into the lungs every 6 (six) hours as needed for wheezing or shortness of breath.   chlorthalidone (HYGROTON) 50 MG tablet Take 1 tablet (50 mg total) by mouth daily.   cholecalciferol (VITAMIN D3) 25 MCG (1000 UNIT) tablet Take 1,000 Units by mouth daily.   dexamethasone (DECADRON) 6 MG tablet Take 1 tablet (6 mg total) by mouth daily for 7 days.   diclofenac sodium (VOLTAREN) 1 % GEL Apply 2 g topically 4 (four) times daily. (Patient taking differently: Apply 2 g topically 4 (four) times daily as needed (pain).)   dicyclomine (BENTYL) 10 MG capsule Take 1-2 capsules (10-20 mg total) by mouth every 8 (eight) hours as needed for spasms.   escitalopram (LEXAPRO) 20 MG tablet Take 1 tablet (20 mg total) by mouth daily.   hydrOXYzine (ATARAX/VISTARIL) 25 MG tablet 1/2 to 1 tid prn anxiety (Patient taking differently: Take 12.5-25 mg by mouth every 8 (eight) hours as needed for anxiety. 1/2 to 1 tid prn anxiety)   lisinopril (ZESTRIL) 40 MG tablet Take 1 tablet (40 mg total) by mouth daily.   metoprolol tartrate (LOPRESSOR) 50 MG tablet TAKE 1 TABLET(50 MG) BY MOUTH TWICE DAILY   Multiple Vitamins-Minerals (MULTIVITAMIN PO) Take 1 tablet by mouth daily.   omeprazole (PRILOSEC) 20 MG capsule Take 1 capsule (20 mg total) by mouth daily.   potassium chloride SA (KLOR-CON) 20 MEQ tablet Take 1 tablet (20 mEq total) by mouth 2 (two) times daily.  vitamin B-12 (CYANOCOBALAMIN) 1000 MCG tablet Take 1,000 mcg by mouth daily.   zinc gluconate 50 MG tablet Take 50 mg by mouth daily.   No facility-administered encounter medications on file as of 08/14/2020.      Observations/Objective: Lab Results  Component Value Date   WBC 9.8 08/10/2020   HGB 11.3 (L) 08/10/2020   HCT 36.5 08/10/2020   MCV 72.3 (L) 08/10/2020   PLT 385 08/10/2020      Chemistry      Component Value Date/Time   NA 140 08/10/2020 0405   NA 140 02/02/2020 1616   K 3.7 08/10/2020 0405   CL 108 08/10/2020 0405   CO2 23 08/10/2020 0405   BUN 18 08/10/2020 0405   BUN 15 02/02/2020 1616   CREATININE 0.67 08/10/2020 0405   CREATININE 0.68 06/21/2016 1543      Component Value Date/Time   CALCIUM 8.6 (L) 08/10/2020 0405   ALKPHOS 71 08/10/2020 0405   AST 27 08/10/2020 0405   ALT 31 08/10/2020 0405   BILITOT 0.4 08/10/2020 0405   BILITOT 0.3 02/02/2020 1616       Assessment and Plan: 1. Hospital discharge follow-up 2. Acute respiratory failure with hypoxia (HCC) 3. COVID-19 virus infection -Patient to complete the doxycycline.  She has completed her steroid.  She will continue to monitor her pulse ox.  We will have her come in in about 3 weeks to recheck and see whether she still needs the O2.  Refill sent on albuterol inhaler.  Advised that she can come out of quarantine after about 10 days provided her respiratory symptoms continue to improve she does not have fever where she has to use antipyretic.  Encouraged her to have COVID-19 vaccine series starting in about 3 months.  4. Essential hypertension - lisinopril (ZESTRIL) 40 MG tablet; Take 1 tablet (40 mg total) by mouth daily.  Dispense: 90 tablet; Refill: 1 - chlorthalidone (HYGROTON) 50 MG tablet; Take 1 tablet (50 mg total) by mouth daily.  Dispense: 90 tablet; Refill: 1 - metoprolol tartrate (LOPRESSOR) 50 MG tablet; TAKE 1 TABLET(50 MG) BY MOUTH TWICE DAILY  Dispense: 180 tablet; Refill: 1  5. Mild intermittent asthma without complication - albuterol (VENTOLIN HFA) 108 (90 Base) MCG/ACT inhaler; Inhale 2 puffs into the lungs every 6 (six) hours as needed for wheezing or shortness of breath.  Dispense: 18 g; Refill: 5  6. GAD (generalized anxiety disorder) Patient requested refill on Lexapro. - escitalopram (LEXAPRO) 20 MG tablet; Take 1 tablet (20 mg total) by mouth daily.  Dispense: 90  tablet; Refill: 1   Follow Up Instructions: 3 wks   I discussed the assessment and treatment plan with the patient. The patient was provided an opportunity to ask questions and all were answered. The patient agreed with the plan and demonstrated an understanding of the instructions.   The patient was advised to call back or seek an in-person evaluation if the symptoms worsen or if the condition fails to improve as anticipated.  I provided 12 minutes of non-face-to-face time during this encounter.   Karle Plumber, MD

## 2020-08-16 ENCOUNTER — Ambulatory Visit: Payer: Self-pay | Admitting: *Deleted

## 2020-08-16 NOTE — Telephone Encounter (Signed)
  Pt called in stating she just now vomited once some stomach acid and had diarrhea at the same time.   Her stomach hurt right before she vomited but she is feeling much better now. She has not eaten anything today so had some coffee with cream.   "Maybe the creamer was bad I don't know what caused this".   "I'm drinking plenty of water".  I went over the home care advice with her.  I encouraged her to take some Pepto Bismol since she had some that it would help the vomiting and diarrhea.   She was agreeable to this.  She also took a Omeprazole just now for the acid.   I let her know that was good.  She was agreeable with monitoring herself to see if she vomited or had diarrhea anymore.     Reason for Disposition . MILD vomiting with diarrhea  Answer Assessment - Initial Assessment Questions 1. VOMITING SEVERITY: "How many times have you vomited in the past 24 hours?"     - MILD:  1 - 2 times/day    - MODERATE: 3 - 5 times/day, decreased oral intake without significant weight loss or symptoms of dehydration    - SEVERE: 6 or more times/day, vomits everything or nearly everything, with significant weight loss, symptoms of dehydration      I vomited once.   It was acid.   I had diarrhea at the same time.   I had coffee   I took omeprazole 15 minutes.  2. ONSET: "When did the vomiting begin?"      Before I called just now.  I drinking water 3. FLUIDS: "What fluids or food have you vomited up today?" "Have you been able to keep any fluids down?"     I'm drinking water 4. ABDOMINAL PAIN: "Are your having any abdominal pain?" If yes : "How bad is it and what does it feel like?" (e.g., crampy, dull, intermittent, constant)      It's hurting on my left side.     5. DIARRHEA: "Is there any diarrhea?" If Yes, ask: "How many times today?"      Once just a few minutes ago. 6. CONTACTS: "Is there anyone else in the family with the same symptoms?"      No   I'm recovering from covid and pneumonia in  both lungs. 7. CAUSE: "What do you think is causing your vomiting?"     Creamer maybe bad. 8. HYDRATION STATUS: "Any signs of dehydration?" (e.g., dry mouth [not only dry lips], too weak to stand) "When did you last urinate?"     No 9. OTHER SYMPTOMS: "Do you have any other symptoms?" (e.g., fever, headache, vertigo, vomiting blood or coffee grounds, recent head injury)     I'm feeling much better now. 10. PREGNANCY: "Is there any chance you are pregnant?" "When was your last menstrual period?"       Not asked due to age  Protocols used: [facility]

## 2020-08-18 ENCOUNTER — Telehealth: Payer: Self-pay | Admitting: Internal Medicine

## 2020-08-18 NOTE — Telephone Encounter (Signed)
Copied from [place] [id number]. Topic: Appointment Scheduling - Scheduling Inquiry for Clinic >> Aug 17, 2020  4:48 PM Greggory Keen D wrote: Pt needs to schedule an appt for Covid pneumonia HFU.  She got out of the hospital on 1/27.  She is on oxygen.  Needs hospital fu asap  CB#  [phone]  Called patient and LVM advising her to call [phone] to schedule a HFU ASAP. If patient calls back and all appointments are too far away please advise patient to call [phone] [facility]) to get scheduled with the mobile medicine unit.

## 2020-08-21 ENCOUNTER — Encounter: Payer: Self-pay | Admitting: Physician Assistant

## 2020-08-21 ENCOUNTER — Telehealth (INDEPENDENT_AMBULATORY_CARE_PROVIDER_SITE_OTHER): Payer: HRSA Program | Admitting: Physician Assistant

## 2020-08-21 DIAGNOSIS — U071 COVID-19: Secondary | ICD-10-CM

## 2020-08-21 NOTE — Progress Notes (Signed)
Established Patient Office Visit  Subjective:  Patient ID: Theresa Pearson, female    DOB: 31-May-1966  Age: 55 y.o. MRN: [id number]  CC:  Chief Complaint  Patient presents with  . Covid Positive   Virtual Visit via Telephone Note  I connected with Ulis Rias on 08/21/20 at  8:30 AM EST by telephone and verified that I am speaking with the correct person using two identifiers.  Location: Patient: Home Provider: Primary Care at [facility]   I discussed the limitations, risks, security and privacy concerns of performing an evaluation and management service by telephone and the availability of in person appointments. I also discussed with the patient that there may be a patient responsible charge related to this service. The patient expressed understanding and agreed to proceed.   History of Present Illness:  Shailey Uhl reports that she continues to have a productive cough with yellow sputum, states that she has also been having a pressure headache for the last 3 days.  Reports the pressure on the right and backside of her head.  Reports that she has been using over-the-counter sinus medication and Tylenol with some relief.  Reports inhaler and Tessalon Perles are helping with cough and shortness of breath.  States that she is having a headache - pressure pain on the right side and back of head - sinus pills and tylenol , states this offer some relief, is able to sleep.  States that she has had an episode of lightheadedness yesterday while she was preparing a meal.  Reports that she had to sit down and rest for to resolve.  Reports episode of diarrhea yesterday, denies nausea or vomiting.  Is eating and drinking okay.  States that she is staying very well-hydrated.      Observations/Objective:  Medical history and current medications reviewed, no physical exam completed     Past Medical History:  Diagnosis Date  . Anemia   . Anxiety   . Arthritis   . Asthma   .  Chronic headaches   . Depression   . Diverticulosis   . Fibromyalgia   . Gallstones   . GERD (gastroesophageal reflux disease)   . Hypertension   . Pneumonia   . Right leg DVT ([place])   . Sleep apnea     Past Surgical History:  Procedure Laterality Date  . ABDOMINAL HYSTERECTOMY  2008   for  fibroids   . CESAREAN SECTION    . CHOLECYSTECTOMY      Family History  Problem Relation Age of Onset  . CAD Mother   . Diabetes Mellitus II Mother   . Stroke Mother   . Colon polyps Mother   . Crohn's disease Mother   . Allergy (severe) Sister   . Colon polyps Sister   . Allergy (severe) Son   . Prostate cancer Maternal Grandfather   . Diabetes Maternal Grandmother   . Hypertension Maternal Grandmother   . Heart attack Maternal Grandmother     Social History   Socioeconomic History  . Marital status: Divorced    Spouse name: Not on file  . Number of children: 2  . Years of education: Not on file  . Highest education level: Not on file  Occupational History  . Occupation: truck Geophysicist/field seismologist  Tobacco Use  . Smoking status: Never Smoker  . Smokeless tobacco: Never Used  Substance and Sexual Activity  . Alcohol use: Yes    Alcohol/week: 1.0 - 2.0 standard drink    Types: 1 -  2 Glasses of wine per week    Comment: occoasional.  . Drug use: No  . Sexual activity: Yes  Other Topics Concern  . Not on file  Social History Narrative  . Not on file   Social Determinants of Health   Financial Resource Strain: Not on file  Food Insecurity: Not on file  Transportation Needs: Not on file  Physical Activity: Not on file  Stress: Not on file  Social Connections: Not on file  Intimate Partner Violence: Not on file    Outpatient Medications Prior to Visit  Medication Sig Dispense Refill  . acetaminophen (TYLENOL) 500 MG tablet Take 500 mg by mouth every 6 (six) hours as needed for mild pain.    Marland Kitchen albuterol (VENTOLIN HFA) 108 (90 Base) MCG/ACT inhaler Inhale 2 puffs into the lungs  every 6 (six) hours as needed for wheezing or shortness of breath. 18 g 5  . benzonatate (TESSALON) 100 MG capsule Take 1 capsule (100 mg total) by mouth 3 (three) times daily as needed for cough. 30 capsule 0  . chlorthalidone (HYGROTON) 50 MG tablet Take 1 tablet (50 mg total) by mouth daily. 90 tablet 1  . cholecalciferol (VITAMIN D3) 25 MCG (1000 UNIT) tablet Take 1,000 Units by mouth daily.    . diclofenac sodium (VOLTAREN) 1 % GEL Apply 2 g topically 4 (four) times daily. (Patient taking differently: Apply 2 g topically 4 (four) times daily as needed (pain).) 100 g 0  . dicyclomine (BENTYL) 10 MG capsule Take 1-2 capsules (10-20 mg total) by mouth every 8 (eight) hours as needed for spasms. 90 capsule 5  . escitalopram (LEXAPRO) 20 MG tablet Take 1 tablet (20 mg total) by mouth daily. 90 tablet 1  . hydrOXYzine (ATARAX/VISTARIL) 25 MG tablet 1/2 to 1 tid prn anxiety (Patient taking differently: Take 12.5-25 mg by mouth every 8 (eight) hours as needed for anxiety. 1/2 to 1 tid prn anxiety) 60 tablet 1  . lisinopril (ZESTRIL) 40 MG tablet Take 1 tablet (40 mg total) by mouth daily. 90 tablet 1  . metoprolol tartrate (LOPRESSOR) 50 MG tablet TAKE 1 TABLET(50 MG) BY MOUTH TWICE DAILY 180 tablet 1  . Multiple Vitamins-Minerals (MULTIVITAMIN PO) Take 1 tablet by mouth daily.    Marland Kitchen omeprazole (PRILOSEC) 20 MG capsule Take 1 capsule (20 mg total) by mouth daily. 90 capsule 3  . potassium chloride SA (KLOR-CON) 20 MEQ tablet Take 1 tablet (20 mEq total) by mouth 2 (two) times daily. 60 tablet 3  . vitamin B-12 (CYANOCOBALAMIN) 1000 MCG tablet Take 1,000 mcg by mouth daily.    Marland Kitchen zinc gluconate 50 MG tablet Take 50 mg by mouth daily.     No facility-administered medications prior to visit.    Allergies  Allergen Reactions  . Penicillins Shortness Of Breath and Rash    ROS Review of Systems  Constitutional: Negative for chills and fever.  HENT: Positive for congestion.   Eyes: Negative.    Respiratory: Positive for cough and shortness of breath. Negative for wheezing.   Cardiovascular: Negative.   Gastrointestinal: Positive for diarrhea. Negative for nausea and vomiting.  Genitourinary: Negative.   Musculoskeletal: Negative.   Skin: Negative.   Neurological: Positive for headaches. Negative for syncope, weakness and light-headedness.  Hematological: Negative.   Psychiatric/Behavioral: Negative.       Objective:     There were no vitals taken for this visit. Wt Readings from Last 3 Encounters:  02/02/20 287 lb (130.2 kg)  03/12/19  261 lb (118.4 kg)  06/16/18 272 lb (123.4 kg)     Health Maintenance Due  Topic Date Due  . COVID-19 Vaccine (1) Never done  . COLONOSCOPY (Pts 45-3yrs Insurance coverage will need to be confirmed)  Never done  . MAMMOGRAM  Never done  . INFLUENZA VACCINE  02/13/2020    There are no preventive care reminders to display for this patient.  Lab Results  Component Value Date   TSH 1.990 02/02/2020   Lab Results  Component Value Date   WBC 9.8 08/10/2020   HGB 11.3 (L) 08/10/2020   HCT 36.5 08/10/2020   MCV 72.3 (L) 08/10/2020   PLT 385 08/10/2020   Lab Results  Component Value Date   NA 140 08/10/2020   K 3.7 08/10/2020   CO2 23 08/10/2020   GLUCOSE 188 (H) 08/10/2020   BUN 18 08/10/2020   CREATININE 0.67 08/10/2020   BILITOT 0.4 08/10/2020   ALKPHOS 71 08/10/2020   AST 27 08/10/2020   ALT 31 08/10/2020   PROT 7.3 08/10/2020   ALBUMIN 3.4 (L) 08/10/2020   CALCIUM 8.6 (L) 08/10/2020   ANIONGAP 9 08/10/2020   Lab Results  Component Value Date   CHOL 191 02/02/2020   Lab Results  Component Value Date   HDL 37 (L) 02/02/2020   Lab Results  Component Value Date   LDLCALC 129 (H) 02/02/2020   Lab Results  Component Value Date   TRIG 129 08/08/2020   Lab Results  Component Value Date   CHOLHDL 5.2 (H) 02/02/2020   Lab Results  Component Value Date   HGBA1C 5.9 (H) 02/02/2020      Assessment &  Plan:   Problem List Items Addressed This Visit      Other   COVID-19 virus infection - Primary      No orders of the defined types were placed in this encounter.  Assessment and Plan:   1. COVID-19 virus infection Patient was once again encouraged to follow-up with the post Covid care center.  Patient is unable to use ibuprofen due to GI adverse effects.  Encourage patient to continue to stay well-hydrated, over-the-counter pain relievers for headaches.  Red flags given for prompt reevaluation  Follow Up Instructions:    I discussed the assessment and treatment plan with the patient. The patient was provided an opportunity to ask questions and all were answered. The patient agreed with the plan and demonstrated an understanding of the instructions.   The patient was advised to call back or seek an in-person evaluation if the symptoms worsen or if the condition fails to improve as anticipated.  I provided 22 minutes of non-face-to-face time during this encounter.     Follow-up: Return if symptoms worsen or fail to improve.    Loraine Grip Janaiya Beauchesne, PA-C

## 2020-08-21 NOTE — Progress Notes (Signed)
Patient verified DOB Patient has not taken medication today an Patient complains of yellow productive cough. Patient complains of diarrhea on yesterday. Patient complains of HA for 3 day with no relief located temporal. Patient denies fevers at home. Patient shares taste and smell is present with appetite being fine. Patients inhaler and tessalon has helped.

## 2020-08-21 NOTE — Patient Instructions (Signed)
I encourage you to stay well-hydrated, try adding a small amount of caffeine to your regimen to help with your headaches.  I encourage you to reach out to the post Covid care center for further evaluation.  Please let us know if there is anything else we can do for you  Kennieth Rad, PA-C Physician Assistant [place] [email or web address]    Tension Headache, Adult A tension headache is a feeling of pain, pressure, or aching over the front and sides of the head. The pain can be dull, or it can feel tight. There are two types of tension headache:  Episodic tension headache. This is when the headaches happen fewer than 15 days a month.  Chronic tension headache. This is when the headaches happen more than 15 days a month during a 83-month period. A tension headache can last from 30 minutes to several days. It is the most common kind of headache. Tension headaches are not normally associated with nausea or vomiting, and they do not get worse with physical activity. What are the causes? The exact cause of this condition is not known. Tension headaches are often triggered by stress, anxiety, or depression. Other triggers may include:  Alcohol.  Too much caffeine or caffeine withdrawal.  Respiratory infections, such as colds, flu, or sinus infections.  Dental problems or teeth clenching.  Fatigue.  Holding your head and neck in the same position for a long period of time, such as while using a computer.  Smoking.  Arthritis of the neck. What are the signs or symptoms? Symptoms of this condition include:  A feeling of pressure or tightness around the head.  Dull, aching head pain.  Pain over the front and sides of the head.  Tenderness in the muscles of the head, neck, and shoulders. How is this diagnosed? This condition may be diagnosed based on your symptoms, your medical history, and a physical exam. If your  symptoms are severe or unusual, you may have imaging tests, such as a CT scan or an MRI of your head. Your vision may also be checked. How is this treated? This condition may be treated with lifestyle changes and with medicines that help relieve symptoms. Follow these instructions at home: Managing pain  Take over-the-counter and prescription medicines only as told by your health care provider.  When you have a headache, lie down in a dark, quiet room.  If directed, put ice on your head and neck. To do this: ? Put ice in a plastic bag. ? Place a towel between your skin and the bag. ? Leave the ice on for 20 minutes, 2-3 times a day. ? Remove the ice if your skin turns bright red. This is very important. If you cannot feel pain, heat, or cold, you have a greater risk of damage to the area.  If directed, apply heat to the back of your neck as often as told by your health care provider. Use the heat source that your health care provider recommends, such as a moist heat pack or a heating pad. ? Place a towel between your skin and the heat source. ? Leave the heat on for 20-30 minutes. ? Remove the heat if your skin turns bright red. This is especially important if you are unable to feel pain, heat, or cold. You have a greater risk of getting burned. Eating and drinking  Eat meals on a regular schedule.  If you drink alcohol: ? Limit how much you have to:  0-1 drink a day for women who are not pregnant.  0-2 drinks a day for men. ? Know how much alcohol is in your drink. In the [place] one drink equals one 12 oz bottle of beer (355 mL), one 5 oz glass of wine (148 mL), or one 1 oz glass of hard liquor (44 mL).  Drink enough fluid to keep your urine pale yellow.  Decrease your caffeine intake, or stop using caffeine. Lifestyle  Get 7-9 hours of sleep each night, or get the amount of sleep recommended by your health care provider.  At bedtime, remove computers, phones, and tablets from  your room.  Find ways to manage your stress. This may include: ? Exercise. ? Deep breathing exercises. ? Yoga. ? Listening to music. ? Positive mental imagery.  Try to sit up straight and avoid tensing your muscles.  Do not use any products that contain nicotine or tobacco. These include cigarettes, chewing tobacco, and vaping devices, such as e-cigarettes. If you need help quitting, ask your health care provider. General instructions  Avoid any headache triggers. Keep a journal to help find out what may trigger your headaches. For example, write down: ? What you eat and drink. ? How much sleep you get. ? Any change to your diet or medicines.  Keep all follow-up visits. This is important.   Contact a health care provider if:  Your headache does not get better.  Your headache comes back.  You are sensitive to sounds, light, or smells because of a headache.  You have nausea or you vomit.  Your stomach hurts. Get help right away if:  You suddenly develop a severe headache, along with any of the following: ? A stiff neck. ? Nausea and vomiting. ? Confusion. ? Weakness in one part or one side of your body. ? Double vision or loss of vision. ? Shortness of breath. ? Rash. ? Unusual sleepiness. ? Fever or chills. ? Trouble speaking. ? Pain in your eye or ear. ? Trouble walking or balancing. ? Feeling faint or passing out. Summary  A tension headache is a feeling of pain, pressure, or aching over the front and sides of the head.  A tension headache can last from 30 minutes to several days. It is the most common kind of headache.  This condition may be diagnosed based on your symptoms, your medical history, and a physical exam.  This condition may be treated with lifestyle changes and with medicines that help relieve symptoms. This information is not intended to replace advice given to you by your health care provider. Make sure you discuss any questions you have with  your health care provider. Document Revised: 03/30/2020 Document Reviewed: 03/30/2020 Elsevier Patient Education  2021 [employer].

## 2020-08-23 ENCOUNTER — Emergency Department (HOSPITAL_COMMUNITY): Payer: Medicaid Other | Admitting: Certified Registered Nurse Anesthetist

## 2020-08-23 ENCOUNTER — Emergency Department (HOSPITAL_COMMUNITY): Payer: Medicaid Other

## 2020-08-23 ENCOUNTER — Inpatient Hospital Stay (HOSPITAL_COMMUNITY)
Admission: EM | Admit: 2020-08-23 | Discharge: 2020-08-25 | DRG: 253 | Disposition: A | Discharge status: Home / Self Care | Payer: Medicaid Other | Attending: Vascular Surgery | Admitting: Vascular Surgery

## 2020-08-23 ENCOUNTER — Encounter (HOSPITAL_COMMUNITY): Payer: Self-pay

## 2020-08-23 ENCOUNTER — Encounter (HOSPITAL_COMMUNITY)
Admission: EM | Disposition: A | Discharge status: Home / Self Care | Payer: Self-pay | Source: Home / Self Care | Attending: Vascular Surgery

## 2020-08-23 DIAGNOSIS — Z79899 Other long term (current) drug therapy: Secondary | ICD-10-CM | POA: Diagnosis not present

## 2020-08-23 DIAGNOSIS — R0602 Shortness of breath: Secondary | ICD-10-CM | POA: Diagnosis present

## 2020-08-23 DIAGNOSIS — Z8371 Family history of colonic polyps: Secondary | ICD-10-CM | POA: Diagnosis not present

## 2020-08-23 DIAGNOSIS — F32A Depression, unspecified: Secondary | ICD-10-CM | POA: Diagnosis present

## 2020-08-23 DIAGNOSIS — U099 Post covid-19 condition, unspecified: Secondary | ICD-10-CM | POA: Diagnosis present

## 2020-08-23 DIAGNOSIS — F419 Anxiety disorder, unspecified: Secondary | ICD-10-CM | POA: Diagnosis present

## 2020-08-23 DIAGNOSIS — Z86718 Personal history of other venous thrombosis and embolism: Secondary | ICD-10-CM | POA: Diagnosis not present

## 2020-08-23 DIAGNOSIS — Z8249 Family history of ischemic heart disease and other diseases of the circulatory system: Secondary | ICD-10-CM

## 2020-08-23 DIAGNOSIS — Z90710 Acquired absence of both cervix and uterus: Secondary | ICD-10-CM

## 2020-08-23 DIAGNOSIS — Z823 Family history of stroke: Secondary | ICD-10-CM

## 2020-08-23 DIAGNOSIS — Z833 Family history of diabetes mellitus: Secondary | ICD-10-CM | POA: Diagnosis not present

## 2020-08-23 DIAGNOSIS — Z8042 Family history of malignant neoplasm of prostate: Secondary | ICD-10-CM | POA: Diagnosis not present

## 2020-08-23 DIAGNOSIS — M797 Fibromyalgia: Secondary | ICD-10-CM | POA: Diagnosis present

## 2020-08-23 DIAGNOSIS — I10 Essential (primary) hypertension: Secondary | ICD-10-CM | POA: Diagnosis present

## 2020-08-23 DIAGNOSIS — J45909 Unspecified asthma, uncomplicated: Secondary | ICD-10-CM | POA: Diagnosis present

## 2020-08-23 DIAGNOSIS — Z6841 Body Mass Index (BMI) 40.0 and over, adult: Secondary | ICD-10-CM | POA: Diagnosis not present

## 2020-08-23 DIAGNOSIS — I998 Other disorder of circulatory system: Secondary | ICD-10-CM | POA: Diagnosis present

## 2020-08-23 DIAGNOSIS — Z9981 Dependence on supplemental oxygen: Secondary | ICD-10-CM

## 2020-08-23 DIAGNOSIS — Z880 Allergy status to penicillin: Secondary | ICD-10-CM | POA: Diagnosis not present

## 2020-08-23 DIAGNOSIS — I742 Embolism and thrombosis of arteries of the upper extremities: Principal | ICD-10-CM | POA: Diagnosis present

## 2020-08-23 DIAGNOSIS — K219 Gastro-esophageal reflux disease without esophagitis: Secondary | ICD-10-CM | POA: Diagnosis present

## 2020-08-23 DIAGNOSIS — Z8616 Personal history of COVID-19: Secondary | ICD-10-CM

## 2020-08-23 HISTORY — PX: THROMBECTOMY W/ EMBOLECTOMY: SHX2507

## 2020-08-23 LAB — APTT: aPTT: 32 seconds (ref 24–36)

## 2020-08-23 LAB — CBC WITH DIFFERENTIAL/PLATELET
Abs Immature Granulocytes: 0.04 10*3/uL (ref 0.00–0.07)
Basophils Absolute: 0 10*3/uL (ref 0.0–0.1)
Basophils Relative: 0 %
Eosinophils Absolute: 0.1 10*3/uL (ref 0.0–0.5)
Eosinophils Relative: 1 %
HCT: 35.1 % — ABNORMAL LOW (ref 36.0–46.0)
Hemoglobin: 11.1 g/dL — ABNORMAL LOW (ref 12.0–15.0)
Immature Granulocytes: 0 %
Lymphocytes Relative: 21 %
Lymphs Abs: 2.1 10*3/uL (ref 0.7–4.0)
MCH: 22.4 pg — ABNORMAL LOW (ref 26.0–34.0)
MCHC: 31.6 g/dL (ref 30.0–36.0)
MCV: 70.9 fL — ABNORMAL LOW (ref 80.0–100.0)
Monocytes Absolute: 0.8 10*3/uL (ref 0.1–1.0)
Monocytes Relative: 8 %
Neutro Abs: 7 10*3/uL (ref 1.7–7.7)
Neutrophils Relative %: 70 %
Platelets: 309 10*3/uL (ref 150–400)
RBC: 4.95 MIL/uL (ref 3.87–5.11)
RDW: 17.2 % — ABNORMAL HIGH (ref 11.5–15.5)
WBC: 10 10*3/uL (ref 4.0–10.5)
nRBC: 0 % (ref 0.0–0.2)

## 2020-08-23 LAB — BASIC METABOLIC PANEL
Anion gap: 13 (ref 5–15)
BUN: 19 mg/dL (ref 6–20)
CO2: 26 mmol/L (ref 22–32)
Calcium: 8.9 mg/dL (ref 8.9–10.3)
Chloride: 101 mmol/L (ref 98–111)
Creatinine, Ser: 0.9 mg/dL (ref 0.44–1.00)
GFR, Estimated: >60 mL/min (ref >60)
Glucose, Bld: 101 mg/dL — ABNORMAL HIGH (ref 70–99)
Potassium: 4 mmol/L (ref 3.5–5.1)
Sodium: 140 mmol/L (ref 135–145)

## 2020-08-23 LAB — I-STAT CHEM 8, ED
BUN: 22 mg/dL — ABNORMAL HIGH (ref 6–20)
Calcium, Ion: 1.13 mmol/L — ABNORMAL LOW (ref 1.15–1.40)
Chloride: 101 mmol/L (ref 98–111)
Creatinine, Ser: 0.8 mg/dL (ref 0.44–1.00)
Glucose, Bld: 100 mg/dL — ABNORMAL HIGH (ref 70–99)
HCT: 36 % (ref 36.0–46.0)
Hemoglobin: 12.2 g/dL (ref 12.0–15.0)
Potassium: 4.6 mmol/L (ref 3.5–5.1)
Sodium: 138 mmol/L (ref 135–145)
TCO2: 30 mmol/L (ref 22–32)

## 2020-08-23 LAB — PROTIME-INR
INR: 1 (ref 0.8–1.2)
Prothrombin Time: 12.6 seconds (ref 11.4–15.2)

## 2020-08-23 LAB — GLUCOSE, CAPILLARY: Glucose-Capillary: 110 mg/dL — ABNORMAL HIGH (ref 70–99)

## 2020-08-23 SURGERY — THROMBECTOMY ARTERIOVENOUS GORE-TEX GRAFT
Anesthesia: General | Site: Arm Upper | Laterality: Right

## 2020-08-23 MED ORDER — FENTANYL CITRATE (PF) 100 MCG/2ML IJ SOLN
50.0000 ug | Freq: Once | INTRAMUSCULAR | Status: AC
Start: 2020-08-23 — End: 2020-08-23
  Administered 2020-08-23: 50 ug via INTRAVENOUS
  Filled 2020-08-23: qty 2

## 2020-08-23 MED ORDER — OXYCODONE HCL 5 MG/5ML PO SOLN
5.0000 mg | Freq: Once | ORAL | Status: AC | PRN
Start: 2020-08-23 — End: 2020-08-23

## 2020-08-23 MED ORDER — HEPARIN SODIUM (PORCINE) 1000 UNIT/ML IJ SOLN
INTRAMUSCULAR | Status: DC | PRN
  Administered 2020-08-23: 12000 [IU] via INTRAVENOUS

## 2020-08-23 MED ORDER — ONDANSETRON HCL 4 MG/2ML IJ SOLN: 4.0000 mg | Freq: Once | INTRAMUSCULAR | Status: AC

## 2020-08-23 MED ORDER — LIDOCAINE 2% (20 MG/ML) 5 ML SYRINGE
INTRAMUSCULAR | Status: AC
  Filled 2020-08-23: qty 5

## 2020-08-23 MED ORDER — ONDANSETRON HCL 4 MG/2ML IJ SOLN
INTRAMUSCULAR | Status: AC
  Administered 2020-08-23: 4 mg via INTRAVENOUS
  Filled 2020-08-23: qty 2

## 2020-08-23 MED ORDER — OXYCODONE-ACETAMINOPHEN 5-325 MG PO TABS
1.0000 | ORAL_TABLET | ORAL | Status: DC | PRN
  Administered 2020-08-23: 1 via ORAL
  Administered 2020-08-24 – 2020-08-25 (×5): 2 via ORAL
  Filled 2020-08-23: qty 2
  Filled 2020-08-23: qty 1
  Filled 2020-08-23 (×3): qty 2

## 2020-08-23 MED ORDER — HEPARIN (PORCINE) 25000 UT/250ML-% IV SOLN
1900.0000 [IU]/h | INTRAVENOUS | Status: AC
  Administered 2020-08-23: 1200 [IU]/h via INTRAVENOUS
  Administered 2020-08-24: 1400 [IU]/h via INTRAVENOUS
  Administered 2020-08-25: 1900 [IU]/h via INTRAVENOUS
  Filled 2020-08-23 (×4): qty 250

## 2020-08-23 MED ORDER — METOPROLOL TARTRATE 50 MG PO TABS
50.0000 mg | ORAL_TABLET | Freq: Two times a day (BID) | ORAL | Status: DC
  Administered 2020-08-23 – 2020-08-25 (×4): 50 mg via ORAL
  Filled 2020-08-23 (×5): qty 1

## 2020-08-23 MED ORDER — PROPOFOL 10 MG/ML IV BOLUS
INTRAVENOUS | Status: AC
  Filled 2020-08-23: qty 20

## 2020-08-23 MED ORDER — FENTANYL CITRATE (PF) 250 MCG/5ML IJ SOLN
INTRAMUSCULAR | Status: AC
  Filled 2020-08-23: qty 5

## 2020-08-23 MED ORDER — LACTATED RINGERS IV SOLN: INTRAVENOUS | Status: DC | PRN

## 2020-08-23 MED ORDER — BISACODYL 5 MG PO TBEC: 5.0000 mg | DELAYED_RELEASE_TABLET | Freq: Every day | ORAL | Status: DC | PRN

## 2020-08-23 MED ORDER — ONDANSETRON HCL 4 MG/2ML IJ SOLN
4.0000 mg | Freq: Four times a day (QID) | INTRAMUSCULAR | Status: DC | PRN
  Administered 2020-08-24 (×2): 4 mg via INTRAVENOUS
  Filled 2020-08-23 (×2): qty 2

## 2020-08-23 MED ORDER — ONDANSETRON HCL 4 MG/2ML IJ SOLN
INTRAMUSCULAR | Status: DC | PRN
  Administered 2020-08-23: 4 mg via INTRAVENOUS

## 2020-08-23 MED ORDER — VANCOMYCIN HCL 1000 MG IV SOLR
INTRAVENOUS | Status: DC | PRN
  Administered 2020-08-23: 1000 mg via INTRAVENOUS

## 2020-08-23 MED ORDER — ROCURONIUM BROMIDE 10 MG/ML (PF) SYRINGE
PREFILLED_SYRINGE | INTRAVENOUS | Status: AC
  Filled 2020-08-23: qty 10

## 2020-08-23 MED ORDER — SUGAMMADEX SODIUM 200 MG/2ML IV SOLN
INTRAVENOUS | Status: DC | PRN
  Administered 2020-08-23: 300 mg via INTRAVENOUS

## 2020-08-23 MED ORDER — LIDOCAINE 2% (20 MG/ML) 5 ML SYRINGE
INTRAMUSCULAR | Status: DC | PRN
  Administered 2020-08-23: 60 mg via INTRAVENOUS

## 2020-08-23 MED ORDER — OXYCODONE HCL 5 MG PO TABS
ORAL_TABLET | ORAL | Status: AC
  Filled 2020-08-23: qty 1

## 2020-08-23 MED ORDER — GUAIFENESIN-DM 100-10 MG/5ML PO SYRP: 15.0000 mL | ORAL_SOLUTION | ORAL | Status: DC | PRN

## 2020-08-23 MED ORDER — PROPOFOL 10 MG/ML IV BOLUS
INTRAVENOUS | Status: DC | PRN
  Administered 2020-08-23: 50 mg via INTRAVENOUS
  Administered 2020-08-23: 100 mg via INTRAVENOUS

## 2020-08-23 MED ORDER — DEXAMETHASONE SODIUM PHOSPHATE 10 MG/ML IJ SOLN
INTRAMUSCULAR | Status: DC | PRN
  Administered 2020-08-23: 5 mg via INTRAVENOUS

## 2020-08-23 MED ORDER — SODIUM CHLORIDE 0.9 % IV SOLN
INTRAVENOUS | Status: DC | PRN
  Administered 2020-08-23: 500 mL

## 2020-08-23 MED ORDER — LISINOPRIL 40 MG PO TABS
40.0000 mg | ORAL_TABLET | Freq: Every day | ORAL | Status: DC
  Administered 2020-08-24 – 2020-08-25 (×2): 40 mg via ORAL
  Filled 2020-08-23 (×2): qty 1

## 2020-08-23 MED ORDER — ALBUTEROL SULFATE HFA 108 (90 BASE) MCG/ACT IN AERS
2.0000 | INHALATION_SPRAY | Freq: Four times a day (QID) | RESPIRATORY_TRACT | Status: DC | PRN
  Administered 2020-08-24 – 2020-08-25 (×3): 2 via RESPIRATORY_TRACT
  Filled 2020-08-23 (×2): qty 6.7

## 2020-08-23 MED ORDER — HYDRALAZINE HCL 20 MG/ML IJ SOLN
INTRAMUSCULAR | Status: AC
  Administered 2020-08-23: 5 mg via INTRAVENOUS
  Filled 2020-08-23: qty 1

## 2020-08-23 MED ORDER — ACETAMINOPHEN 325 MG PO TABS: 325.0000 mg | ORAL_TABLET | ORAL | Status: DC | PRN

## 2020-08-23 MED ORDER — PHENOL 1.4 % MT LIQD: 1.0000 | OROMUCOSAL | Status: DC | PRN

## 2020-08-23 MED ORDER — POLYETHYLENE GLYCOL 3350 17 G PO PACK: 17.0000 g | PACK | Freq: Every day | ORAL | Status: DC | PRN

## 2020-08-23 MED ORDER — METOPROLOL TARTRATE 5 MG/5ML IV SOLN: 2.0000 mg | INTRAVENOUS | Status: DC | PRN

## 2020-08-23 MED ORDER — MORPHINE SULFATE (PF) 2 MG/ML IV SOLN
2.0000 mg | INTRAVENOUS | Status: DC | PRN
  Administered 2020-08-24 (×2): 2 mg via INTRAVENOUS
  Filled 2020-08-23 (×2): qty 1

## 2020-08-23 MED ORDER — FENTANYL CITRATE (PF) 250 MCG/5ML IJ SOLN
INTRAMUSCULAR | Status: DC | PRN
  Administered 2020-08-23: 50 ug via INTRAVENOUS
  Administered 2020-08-23: 100 ug via INTRAVENOUS

## 2020-08-23 MED ORDER — IOHEXOL 350 MG/ML SOLN
100.0000 mL | Freq: Once | INTRAVENOUS | Status: AC | PRN
  Administered 2020-08-23: 100 mL via INTRAVENOUS

## 2020-08-23 MED ORDER — ESCITALOPRAM OXALATE 20 MG PO TABS
20.0000 mg | ORAL_TABLET | Freq: Every day | ORAL | Status: DC
  Administered 2020-08-24 – 2020-08-25 (×2): 20 mg via ORAL
  Filled 2020-08-23 (×2): qty 1

## 2020-08-23 MED ORDER — 0.9 % SODIUM CHLORIDE (POUR BTL) OPTIME
TOPICAL | Status: DC | PRN
  Administered 2020-08-23: 1000 mL

## 2020-08-23 MED ORDER — DEXAMETHASONE SODIUM PHOSPHATE 10 MG/ML IJ SOLN
INTRAMUSCULAR | Status: AC
  Filled 2020-08-23: qty 1

## 2020-08-23 MED ORDER — HYDRALAZINE HCL 20 MG/ML IJ SOLN
5.0000 mg | INTRAMUSCULAR | Status: AC | PRN
  Administered 2020-08-23: 5 mg via INTRAVENOUS

## 2020-08-23 MED ORDER — FENTANYL CITRATE (PF) 100 MCG/2ML IJ SOLN
INTRAMUSCULAR | Status: AC
  Administered 2020-08-23: 50 ug via INTRAVENOUS
  Filled 2020-08-23: qty 2

## 2020-08-23 MED ORDER — SODIUM CHLORIDE 0.9 % IV SOLN
INTRAVENOUS | Status: AC
  Filled 2020-08-23: qty 1.2

## 2020-08-23 MED ORDER — ACETAMINOPHEN 650 MG RE SUPP: 325.0000 mg | RECTAL | Status: DC | PRN

## 2020-08-23 MED ORDER — LABETALOL HCL 5 MG/ML IV SOLN
10.0000 mg | INTRAVENOUS | Status: DC | PRN
  Administered 2020-08-23: 10 mg via INTRAVENOUS
  Filled 2020-08-23 (×2): qty 4

## 2020-08-23 MED ORDER — PHENYLEPHRINE HCL-NACL 10-0.9 MG/250ML-% IV SOLN
INTRAVENOUS | Status: DC | PRN
  Administered 2020-08-23: 30 ug/min via INTRAVENOUS

## 2020-08-23 MED ORDER — LIDOCAINE HCL (PF) 1 % IJ SOLN
INTRAMUSCULAR | Status: AC
  Filled 2020-08-23: qty 30

## 2020-08-23 MED ORDER — MORPHINE SULFATE (PF) 2 MG/ML IV SOLN
INTRAVENOUS | Status: AC
  Administered 2020-08-23: 1 mg via INTRAVENOUS
  Filled 2020-08-23: qty 1

## 2020-08-23 MED ORDER — HYDROXYZINE HCL 25 MG PO TABS
12.5000 mg | ORAL_TABLET | Freq: Three times a day (TID) | ORAL | Status: DC | PRN
  Administered 2020-08-23 – 2020-08-24 (×3): 25 mg via ORAL
  Filled 2020-08-23 (×3): qty 1

## 2020-08-23 MED ORDER — HEMOSTATIC AGENTS (NO CHARGE) OPTIME
TOPICAL | Status: DC | PRN
  Administered 2020-08-23: 1 via TOPICAL

## 2020-08-23 MED ORDER — SODIUM CHLORIDE 0.9 % IV SOLN: INTRAVENOUS | Status: DC

## 2020-08-23 MED ORDER — ALUM & MAG HYDROXIDE-SIMETH 200-200-20 MG/5ML PO SUSP: 15.0000 mL | ORAL | Status: DC | PRN

## 2020-08-23 MED ORDER — ROCURONIUM BROMIDE 100 MG/10ML IV SOLN
INTRAVENOUS | Status: DC | PRN
  Administered 2020-08-23: 50 mg via INTRAVENOUS

## 2020-08-23 MED ORDER — VANCOMYCIN HCL IN DEXTROSE 1-5 GM/200ML-% IV SOLN
INTRAVENOUS | Status: AC
  Filled 2020-08-23: qty 200

## 2020-08-23 MED ORDER — FENTANYL CITRATE (PF) 100 MCG/2ML IJ SOLN
25.0000 ug | INTRAMUSCULAR | Status: DC | PRN
  Administered 2020-08-23 (×2): 50 ug via INTRAVENOUS

## 2020-08-23 MED ORDER — PHENYLEPHRINE HCL (PRESSORS) 10 MG/ML IV SOLN
INTRAVENOUS | Status: AC
  Filled 2020-08-23: qty 2

## 2020-08-23 MED ORDER — ONDANSETRON HCL 4 MG/2ML IJ SOLN
INTRAMUSCULAR | Status: AC
  Filled 2020-08-23: qty 2

## 2020-08-23 MED ORDER — OXYCODONE HCL 5 MG PO TABS
5.0000 mg | ORAL_TABLET | Freq: Once | ORAL | Status: AC | PRN
  Administered 2020-08-23: 5 mg via ORAL

## 2020-08-23 SURGICAL SUPPLY — 36 items
ARMBAND PINK RESTRICT EXTREMIT (MISCELLANEOUS) ×2 IMPLANT
CANISTER SUCT 3000ML PPV (MISCELLANEOUS) ×2 IMPLANT
CATH EMB 3FR 40CM (CATHETERS) ×2 IMPLANT
CATH EMB 4FR 40CM (CATHETERS) ×2 IMPLANT
CATH EMB 4FR 80CM (CATHETERS) ×2 IMPLANT
CLIP VESOCCLUDE MED 6/CT (CLIP) ×2 IMPLANT
CLIP VESOCCLUDE SM WIDE 6/CT (CLIP) ×2 IMPLANT
COVER PROBE W GEL 5X96 (DRAPES) ×4 IMPLANT
COVER WAND RF STERILE (DRAPES) ×2 IMPLANT
DERMABOND ADVANCED (GAUZE/BANDAGES/DRESSINGS) ×1
DERMABOND ADVANCED .7 DNX12 (GAUZE/BANDAGES/DRESSINGS) ×1 IMPLANT
ELECT REM PT RETURN 9FT ADLT (ELECTROSURGICAL) ×2
ELECTRODE REM PT RTRN 9FT ADLT (ELECTROSURGICAL) ×1 IMPLANT
GLOVE BIO SURGEON STRL SZ7.5 (GLOVE) ×2 IMPLANT
GLOVE SRG 8 PF TXTR STRL LF DI (GLOVE) ×1 IMPLANT
GLOVE SURG UNDER POLY LF SZ8 (GLOVE) ×1
GOWN STRL REUS W/ TWL LRG LVL3 (GOWN DISPOSABLE) ×2 IMPLANT
GOWN STRL REUS W/ TWL XL LVL3 (GOWN DISPOSABLE) ×2 IMPLANT
GOWN STRL REUS W/TWL LRG LVL3 (GOWN DISPOSABLE) ×2
GOWN STRL REUS W/TWL XL LVL3 (GOWN DISPOSABLE) ×2
HEMOSTAT SPONGE AVITENE ULTRA (HEMOSTASIS) IMPLANT
HEMOSTAT SURGICEL 2X14 (HEMOSTASIS) ×2 IMPLANT
KIT BASIN OR (CUSTOM PROCEDURE TRAY) ×2 IMPLANT
KIT TURNOVER KIT B (KITS) ×2 IMPLANT
LOOP VESSEL MINI RED (MISCELLANEOUS) ×2 IMPLANT
NS IRRIG 1000ML POUR BTL (IV SOLUTION) ×2 IMPLANT
PACK CV ACCESS (CUSTOM PROCEDURE TRAY) ×2 IMPLANT
PAD ARMBOARD 7.5X6 YLW CONV (MISCELLANEOUS) ×4 IMPLANT
SUT MNCRL AB 4-0 PS2 18 (SUTURE) ×2 IMPLANT
SUT PROLENE 5 0 C 1 24 (SUTURE) ×2 IMPLANT
SUT PROLENE 6 0 BV (SUTURE) ×12 IMPLANT
SUT VIC AB 3-0 SH 27 (SUTURE) ×1
SUT VIC AB 3-0 SH 27X BRD (SUTURE) ×1 IMPLANT
TOWEL GREEN STERILE (TOWEL DISPOSABLE) ×2 IMPLANT
UNDERPAD 30X36 HEAVY ABSORB (UNDERPADS AND DIAPERS) ×2 IMPLANT
WATER STERILE IRR 1000ML POUR (IV SOLUTION) ×2 IMPLANT

## 2020-08-23 NOTE — Progress Notes (Signed)
ANTICOAGULATION CONSULT NOTE - Initial Consult  Pharmacy Consult for heparin Indication: ischemic RUE  Allergies  Allergen Reactions  . Penicillins Shortness Of Breath and Rash     Patient Measurements: Height: 5\' 5"  (165.1 cm) Weight: 125.2 kg (276 lb) IBW/kg (Calculated) : 57 Heparin Dosing Weight: 87.4 kg (using estimated pt wt)  Vital Signs: Temp: 97.8 F (36.6 C) (02/09 1426) Temp Source: Oral (02/09 1426) BP: 152/83 (02/09 1324) Pulse Rate: 79 (02/09 1324)  Labs: Recent Labs    08/23/20 1254 08/23/20 1304  HGB 11.1* 12.2  HCT 35.1* 36.0  PLT 309  --   APTT 32  --   LABPROT 12.6  --   INR 1.0  --   CREATININE 0.90 0.80    Estimated Creatinine Clearance: 108.2 mL/min (by C-G formula based on SCr of 0.8 mg/dL).   Medical History: Past Medical History:  Diagnosis Date  . Anemia   . Anxiety   . Arthritis   . Asthma   . Chronic headaches   . Depression   . Diverticulosis   . Fibromyalgia   . Gallstones   . GERD (gastroesophageal reflux disease)   . Hypertension   . Pneumonia   . Right leg DVT ([place])   . Sleep apnea     Medications:  Scheduled:  . fentaNYL      . fentaNYL       Infusions:  . sodium chloride    . heparin    . vancomycin      Assessment: 55 yr old woman with hx of recent COVID infection and remote DVT in leg (on no anticoagulation PTA) was admitted today with ischemic RUE. CTA done at [facility] prior to transfer to [facility] was of poor quality and difficult to interpret, per provider note. Pt is S/P RUE thrombectomy 2/9. Pharmacy is consulted to dose IV heparin.   MD started heparin infusion at 1200 units/hr - discussed and okay to titrate to goal without bolus tonight. Hgb 12.2, plt 308. No s/sx of bleeding.   Goal of Therapy:  Heparin level 0.3-0.7 units/ml Monitor platelets by anticoagulation protocol: Yes   Plan:  Start heparin infusion at 1200 units/hr  Get heparin level in 6 hours Monitor daily HL, CBC, and for  s/sx of bleeding   Antonietta Jewel, PharmD, [place] Pharmacist  Phone: [phone] 08/23/2020 6:58 PM  Please check AMION for all [place] phone numbers After 10:00 PM, call [place] [phone]

## 2020-08-23 NOTE — ED Triage Notes (Signed)
Pt presents with c/o right arm pain. Pt had Covid 2 weeks ago and has been on O2 since. Pt denies any injury to the right arm, has been hurting since earlier today.

## 2020-08-23 NOTE — ED Notes (Signed)
Charge nurse at [facility] made aware patient is coming over and is being accepted by Hulan Fess, EDP.  [facility] called for transport.

## 2020-08-23 NOTE — ED Notes (Signed)
Cortni PA at bedside

## 2020-08-23 NOTE — Anesthesia Procedure Notes (Signed)
Procedure Name: Intubation Date/Time: 08/23/2020 4:03 PM Performed by: Janene Harvey, CRNA Pre-anesthesia Checklist: Patient identified, Emergency Drugs available, Suction available and Patient being monitored Patient Re-evaluated:Patient Re-evaluated prior to induction Oxygen Delivery Method: Circle system utilized Preoxygenation: Pre-oxygenation with 100% oxygen Induction Type: IV induction Ventilation: Mask ventilation without difficulty and Oral airway inserted - appropriate to patient size Laryngoscope Size: Mac and 4 Grade View: Grade I Tube type: Oral Tube size: 7.0 mm Number of attempts: 1 Airway Equipment and Method: Stylet and Oral airway Placement Confirmation: ETT inserted through vocal cords under direct vision,  positive ETCO2 and breath sounds checked- equal and bilateral Secured at: 22 cm Tube secured with: Tape Dental Injury: Teeth and Oropharynx as per pre-operative assessment

## 2020-08-23 NOTE — Transfer of Care (Signed)
Immediate Anesthesia Transfer of Care Note  Patient: Theresa Pearson  Procedure(s) Performed: Right Upper Extremity Thrombectomy (Right Arm Upper)  Patient Location: PACU  Anesthesia Type:General  Level of Consciousness: awake, alert  and oriented  Airway & Oxygen Therapy: Patient Spontanous Breathing and Patient connected to nasal cannula oxygen  Post-op Assessment: Report given to RN and Post -op Vital signs reviewed and stable  Post vital signs: Reviewed and stable  Last Vitals:  Vitals Value Taken Time  BP 128/91 08/23/20 1734  Temp    Pulse 83 08/23/20 1749  Resp 19 08/23/20 1749  SpO2 100 % 08/23/20 1749  Vitals shown include unvalidated device data.  Last Pain:  Vitals:   08/23/20 1521  TempSrc:   PainSc: 3          Complications: No complications documented.

## 2020-08-23 NOTE — H&P (Signed)
H&P    Reason for Consult:  Ischemic right arm Referring Physician:  Elvina Sidle ED MRN #:  [id number]  History of Present Illness: This is a 55 y.o. female with history of hypertension, asthma, fibromyalgia, remote DVT in the leg that vascular surgery was consulted with concern for ischemic right upper extremity.  Patient presented to the [facility] ED today with acute onset of pain in her right arm that started at 11 AM.  She had numbness in the hand as well.  She was noted to have palpable pulses in all extremities except her right arm.  CTA of the upper extremity was ordered vascular surgery was consulted.  She states she is having pain in her entire arm now.  She was recently diagnosed with Covid about 2 weeks ago and has been on home oxygen.  Past Medical History:  Diagnosis Date  . Anemia   . Anxiety   . Arthritis   . Asthma   . Chronic headaches   . Depression   . Diverticulosis   . Fibromyalgia   . Gallstones   . GERD (gastroesophageal reflux disease)   . Hypertension   . Pneumonia   . Right leg DVT ([place])   . Sleep apnea     Past Surgical History:  Procedure Laterality Date  . ABDOMINAL HYSTERECTOMY  2008   for  fibroids   . CESAREAN SECTION    . CHOLECYSTECTOMY      Allergies  Allergen Reactions  . Penicillins Shortness Of Breath and Rash    Prior to Admission medications   Medication Sig Start Date End Date Taking? Authorizing Provider  acetaminophen (TYLENOL) 500 MG tablet Take 500 mg by mouth every 6 (six) hours as needed for mild pain.    [provider]  albuterol (VENTOLIN HFA) 108 (90 Base) MCG/ACT inhaler Inhale 2 puffs into the lungs every 6 (six) hours as needed for wheezing or shortness of breath. 08/14/20   Ladell Pier, MD  benzonatate (TESSALON) 100 MG capsule Take 1 capsule (100 mg total) by mouth 3 (three) times daily as needed for cough. 08/14/20   Ladell Pier, MD  chlorthalidone (HYGROTON) 50 MG tablet Take 1 tablet (50  mg total) by mouth daily. 08/14/20   Ladell Pier, MD  cholecalciferol (VITAMIN D3) 25 MCG (1000 UNIT) tablet Take 1,000 Units by mouth daily.    [provider]  diclofenac sodium (VOLTAREN) 1 % GEL Apply 2 g topically 4 (four) times daily. Patient taking differently: Apply 2 g topically 4 (four) times daily as needed (pain). 04/21/17   Thurman Coyer, DO  dicyclomine (BENTYL) 10 MG capsule Take 1-2 capsules (10-20 mg total) by mouth every 8 (eight) hours as needed for spasms. 03/12/19   Fulp, Cammie, MD  escitalopram (LEXAPRO) 20 MG tablet Take 1 tablet (20 mg total) by mouth daily. 08/14/20   Ladell Pier, MD  hydrOXYzine (ATARAX/VISTARIL) 25 MG tablet 1/2 to 1 tid prn anxiety Patient taking differently: Take 12.5-25 mg by mouth every 8 (eight) hours as needed for anxiety. 1/2 to 1 tid prn anxiety 02/02/20   Argentina Donovan, PA-C  lisinopril (ZESTRIL) 40 MG tablet Take 1 tablet (40 mg total) by mouth daily. 08/14/20   Ladell Pier, MD  metoprolol tartrate (LOPRESSOR) 50 MG tablet TAKE 1 TABLET(50 MG) BY MOUTH TWICE DAILY 08/14/20   Ladell Pier, MD  Multiple Vitamins-Minerals (MULTIVITAMIN PO) Take 1 tablet by mouth daily.    [provider]  omeprazole (PRILOSEC) 20 MG capsule Take 1 capsule (20 mg total) by mouth daily. 02/02/20   Argentina Donovan, PA-C  potassium chloride SA (KLOR-CON) 20 MEQ tablet Take 1 tablet (20 mEq total) by mouth 2 (two) times daily. 02/02/20   Argentina Donovan, PA-C  vitamin B-12 (CYANOCOBALAMIN) 1000 MCG tablet Take 1,000 mcg by mouth daily.    [provider]  zinc gluconate 50 MG tablet Take 50 mg by mouth daily.    [provider]    Social History   Socioeconomic History  . Marital status: Divorced    Spouse name: Not on file  . Number of children: 2  . Years of education: Not on file  . Highest education level: Not on file  Occupational History  . Occupation: truck Geophysicist/field seismologist  Tobacco Use  . Smoking  status: Never Smoker  . Smokeless tobacco: Never Used  Substance and Sexual Activity  . Alcohol use: Yes    Alcohol/week: 1.0 - 2.0 standard drink    Types: 1 - 2 Glasses of wine per week    Comment: occoasional.  . Drug use: No  . Sexual activity: Yes  Other Topics Concern  . Not on file  Social History Narrative  . Not on file   Social Determinants of Health   Financial Resource Strain: Not on file  Food Insecurity: Not on file  Transportation Needs: Not on file  Physical Activity: Not on file  Stress: Not on file  Social Connections: Not on file  Intimate Partner Violence: Not on file     Family History  Problem Relation Age of Onset  . CAD Mother   . Diabetes Mellitus II Mother   . Stroke Mother   . Colon polyps Mother   . Crohn's disease Mother   . Allergy (severe) Sister   . Colon polyps Sister   . Allergy (severe) Son   . Prostate cancer Maternal Grandfather   . Diabetes Maternal Grandmother   . Hypertension Maternal Grandmother   . Heart attack Maternal Grandmother     ROS: [x]  Positive   [ ]  Negative   [ ]  All sytems reviewed and are negative  Cardiovascular: []  chest pain/pressure []  palpitations []  SOB lying flat []  DOE []  pain in legs while walking []  pain in legs at rest []  pain in legs at night []  non-healing ulcers []  hx of DVT []  swelling in legs  Pulmonary: []  productive cough []  asthma/wheezing []  home O2  Neurologic: []  weakness in []  arms []  legs []  numbness in []  arms []  legs []  hx of CVA []  mini stroke [] difficulty speaking or slurred speech []  temporary loss of vision in one eye []  dizziness  Hematologic: []  hx of cancer []  bleeding problems []  problems with blood clotting easily  Endocrine:   []  diabetes []  thyroid disease  GI []  vomiting blood []  blood in stool  GU: []  CKD/renal failure []  HD--[]  M/W/F or []  T/T/S []  burning with urination []  blood in urine  Psychiatric: []  anxiety []   depression  Musculoskeletal: []  arthritis []  joint pain  Integumentary: []  rashes []  ulcers  Constitutional: []  fever []  chills   Physical Examination  Vitals:   08/23/20 1324 08/23/20 1426  BP: (!) 152/83   Pulse: 79   Resp: (!) 21   Temp:  97.8 F (36.6 C)  SpO2: 100%    There is no height or weight on file to calculate BMI.  General:  WDWN in NAD Gait:  Not observed HENT: WNL, normocephalic Pulmonary: normal non-labored breathing Cardiac: regular, without  Murmurs, rubs or gallops Abdomen: soft, NT/ND Skin: without rashes Vascular Exam/Pulses: Right subclavian artery is palpable above the clavicle.  No palpable axillary or radial pulse in the right arm. Left radial and axillary pulse palpable Bilateral DP pulses easily palpable Extremities: with ischemic changes right hand Musculoskeletal: no muscle wasting or atrophy  Neurologic: A&O X 3; Appropriate Affect ; SENSATION: normal; MOTOR FUNCTION:  moving all extremities equally. Speech is fluent/normal   CBC    Component Value Date/Time   WBC 10.0 08/23/2020 1254   RBC 4.95 08/23/2020 1254   HGB 12.2 08/23/2020 1304   HGB 11.4 02/02/2020 1616   HCT 36.0 08/23/2020 1304   HCT 37.2 02/02/2020 1616   PLT 309 08/23/2020 1254   PLT 413 02/02/2020 1616   MCV 70.9 (L) 08/23/2020 1254   MCV 73 (L) 02/02/2020 1616   MCH 22.4 (L) 08/23/2020 1254   MCHC 31.6 08/23/2020 1254   RDW 17.2 (H) 08/23/2020 1254   RDW 17.5 (H) 02/02/2020 1616   LYMPHSABS 2.1 08/23/2020 1254   LYMPHSABS 2.8 02/02/2020 1616   MONOABS 0.8 08/23/2020 1254   EOSABS 0.1 08/23/2020 1254   EOSABS 0.1 02/02/2020 1616   BASOSABS 0.0 08/23/2020 1254   BASOSABS 0.0 02/02/2020 1616    BMET    Component Value Date/Time   NA 138 08/23/2020 1304   NA 140 02/02/2020 1616   K 4.6 08/23/2020 1304   CL 101 08/23/2020 1304   CO2 26 08/23/2020 1254   GLUCOSE 100 (H) 08/23/2020 1304   BUN 22 (H) 08/23/2020 1304   BUN 15 02/02/2020 1616    CREATININE 0.80 08/23/2020 1304   CREATININE 0.68 06/21/2016 1543   CALCIUM 8.9 08/23/2020 1254   GFRNONAA >60 08/23/2020 1254   GFRNONAA >89 06/21/2016 1543   GFRAA 115 02/02/2020 1616   GFRAA >89 06/21/2016 1543    COAGS: Lab Results  Component Value Date   INR 1.0 08/23/2020     Non-Invasive Vascular Imaging:    CTA right upper extremity very poor quality.   ASSESSMENT/PLAN: This is a 55 y.o. female that was recently diagnosed with Covid about 2 weeks ago presents with likely acutely ischemic right upper extremity.  She had a CTA at [facility] prior to transfer that is of very poor quality and difficult to interpret.  On exam she has easily palpable subclavian pulse but no palpable axillary or radial pulse whereas all of her other extremities have easily palpable pulses.  I recommended a right upper extremity thrombectomy and possible angiogram.  Risk and benefits discussed.  We will proceed to the operating room.     Marty Heck, MD [facility] Office: [place]

## 2020-08-23 NOTE — ED Provider Notes (Signed)
[facility] DEPT Provider Note   CSN: [id number] Arrival date & time: 08/23/20  1204     History Chief Complaint  Patient presents with  . Arm Pain    Theresa Pearson is a 55 y.o. female.  HPI   55 y/o F with a h/o anemia, anxiety, arthritis, asthma, chronic headaches, depression, fibromyalgia, gallstones, GERD, hypertension, pneumonia, VTE, sleep apnea, who presents to the emergency department today for evaluation of arm pain.  States pain was sudden in onset and located to the entire right upper extremity.  Describes as a throbbing pain.  Also states she feels like her circulation is decreased and she has tingling to her hand.  She also notes that she feels the right upper extremity is somewhat weak. Initially pain was 10/10, however it has improved somewhat.  Denies any chest pain.  She does report that she was recently diagnosed with Covid and has been on oxygen secondary to this.  She states that since being diagnosed with Covid she has been having intermittent palpitations.  She denies any recent falls or trauma.  She has never had similar pain and states that this feels different than her fibromyalgia.  Past Medical History:  Diagnosis Date  . Anemia   . Anxiety   . Arthritis   . Asthma   . Chronic headaches   . Depression   . Diverticulosis   . Fibromyalgia   . Gallstones   . GERD (gastroesophageal reflux disease)   . Hypertension   . Pneumonia   . Right leg DVT ([place])   . Sleep apnea     Patient Active Problem List   Diagnosis Date Noted  . COVID-19 virus infection 08/14/2020  . Acute respiratory failure with hypoxia ([place]) 08/14/2020  . Multifocal pneumonia 08/08/2020  . Sepsis due to pneumococcus ([place]) 08/08/2020  . Prolonged QT interval 08/08/2020  . Epigastric pain 06/24/2016  . Bloody diarrhea 06/24/2016  . Bilateral chronic knee pain 10/16/2015  . Severe obesity (BMI >= 40) ([place]) 10/16/2015  . Anemia 09/19/2015  . Vitamin D  deficiency 09/19/2015  . Bilateral impacted cerumen 09/19/2015  . GAD (generalized anxiety disorder) 03/04/2014  . Hypokalemia 06/27/2013  . HTN (hypertension) 06/27/2013  . Sleep apnea 06/27/2013    Past Surgical History:  Procedure Laterality Date  . ABDOMINAL HYSTERECTOMY  2008   for  fibroids   . CESAREAN SECTION    . CHOLECYSTECTOMY       OB History   No obstetric history on file.     Family History  Problem Relation Age of Onset  . CAD Mother   . Diabetes Mellitus II Mother   . Stroke Mother   . Colon polyps Mother   . Crohn's disease Mother   . Allergy (severe) Sister   . Colon polyps Sister   . Allergy (severe) Son   . Prostate cancer Maternal Grandfather   . Diabetes Maternal Grandmother   . Hypertension Maternal Grandmother   . Heart attack Maternal Grandmother     Social History   Tobacco Use  . Smoking status: Never Smoker  . Smokeless tobacco: Never Used  Substance Use Topics  . Alcohol use: Yes    Alcohol/week: 1.0 - 2.0 standard drink    Types: 1 - 2 Glasses of wine per week    Comment: occoasional.  . Drug use: No    Home Medications Prior to Admission medications   Medication Sig Start Date End Date Taking? Authorizing Provider  acetaminophen (TYLENOL)  500 MG tablet Take 500 mg by mouth every 6 (six) hours as needed for mild pain.    [provider]  albuterol (VENTOLIN HFA) 108 (90 Base) MCG/ACT inhaler Inhale 2 puffs into the lungs every 6 (six) hours as needed for wheezing or shortness of breath. 08/14/20   Ladell Pier, MD  benzonatate (TESSALON) 100 MG capsule Take 1 capsule (100 mg total) by mouth 3 (three) times daily as needed for cough. 08/14/20   Ladell Pier, MD  chlorthalidone (HYGROTON) 50 MG tablet Take 1 tablet (50 mg total) by mouth daily. 08/14/20   Ladell Pier, MD  cholecalciferol (VITAMIN D3) 25 MCG (1000 UNIT) tablet Take 1,000 Units by mouth daily.    [provider]  diclofenac sodium  (VOLTAREN) 1 % GEL Apply 2 g topically 4 (four) times daily. Patient taking differently: Apply 2 g topically 4 (four) times daily as needed (pain). 04/21/17   Thurman Coyer, DO  dicyclomine (BENTYL) 10 MG capsule Take 1-2 capsules (10-20 mg total) by mouth every 8 (eight) hours as needed for spasms. 03/12/19   Fulp, Cammie, MD  escitalopram (LEXAPRO) 20 MG tablet Take 1 tablet (20 mg total) by mouth daily. 08/14/20   Ladell Pier, MD  hydrOXYzine (ATARAX/VISTARIL) 25 MG tablet 1/2 to 1 tid prn anxiety Patient taking differently: Take 12.5-25 mg by mouth every 8 (eight) hours as needed for anxiety. 1/2 to 1 tid prn anxiety 02/02/20   Argentina Donovan, PA-C  lisinopril (ZESTRIL) 40 MG tablet Take 1 tablet (40 mg total) by mouth daily. 08/14/20   Ladell Pier, MD  metoprolol tartrate (LOPRESSOR) 50 MG tablet TAKE 1 TABLET(50 MG) BY MOUTH TWICE DAILY 08/14/20   Ladell Pier, MD  Multiple Vitamins-Minerals (MULTIVITAMIN PO) Take 1 tablet by mouth daily.    [provider]  omeprazole (PRILOSEC) 20 MG capsule Take 1 capsule (20 mg total) by mouth daily. 02/02/20   Argentina Donovan, PA-C  potassium chloride SA (KLOR-CON) 20 MEQ tablet Take 1 tablet (20 mEq total) by mouth 2 (two) times daily. 02/02/20   Argentina Donovan, PA-C  vitamin B-12 (CYANOCOBALAMIN) 1000 MCG tablet Take 1,000 mcg by mouth daily.    [provider]  zinc gluconate 50 MG tablet Take 50 mg by mouth daily.    [provider]    Allergies    Penicillins  Review of Systems   Review of Systems  Constitutional: Negative for fever.  HENT: Negative for ear pain and sore throat.   Eyes: Negative for visual disturbance.  Respiratory: Positive for shortness of breath (on o2 since dx w covid).   Cardiovascular: Negative for chest pain.  Gastrointestinal: Negative for abdominal pain, constipation, diarrhea, nausea and vomiting.  Genitourinary: Negative for dysuria and hematuria.   Musculoskeletal: Negative for back pain.       RUE pain  Skin: Negative for rash.  Neurological: Positive for weakness and numbness.  All other systems reviewed and are negative.   Physical Exam Updated Vital Signs BP (!) 152/83 (BP Location: Left Leg)   Pulse 79   Temp 97.8 F (36.6 C) (Oral)   Resp (!) 21   SpO2 100%   Physical Exam Vitals and nursing note reviewed.  Constitutional:      General: She is not in acute distress.    Appearance: She is well-developed and well-nourished.  HENT:     Head: Normocephalic and atraumatic.  Eyes:     Conjunctiva/sclera: Conjunctivae  normal.  Cardiovascular:     Rate and Rhythm: Normal rate and regular rhythm.     Pulses: Normal pulses.          Radial pulses are 2+ on the left side.     Heart sounds: Normal heart sounds. No murmur heard.     Comments: Unable to palpate the right radial pulse. Able to doppler the pulse on the right but it is faint. Pulmonary:     Effort: Pulmonary effort is normal. No respiratory distress.     Breath sounds: Normal breath sounds. No wheezing, rhonchi or rales.  Abdominal:     General: Bowel sounds are normal.     Palpations: Abdomen is soft.     Tenderness: There is no abdominal tenderness.  Musculoskeletal:        General: No edema.     Cervical back: Neck supple.  Skin:    General: Skin is warm and dry.  Neurological:     Mental Status: She is alert.  Psychiatric:        Mood and Affect: Mood and affect normal.     ED Results / Procedures / Treatments   Labs (all labs ordered are listed, but only abnormal results are displayed) Labs Reviewed  CBC WITH DIFFERENTIAL/PLATELET - Abnormal; Notable for the following components:      Result Value   Hemoglobin 11.1 (*)    HCT 35.1 (*)    MCV 70.9 (*)    MCH 22.4 (*)    RDW 17.2 (*)    All other components within normal limits  BASIC METABOLIC PANEL - Abnormal; Notable for the following components:   Glucose, Bld 101 (*)    All other  components within normal limits  I-STAT CHEM 8, ED - Abnormal; Notable for the following components:   BUN 22 (*)    Glucose, Bld 100 (*)    Calcium, Ion 1.13 (*)    All other components within normal limits  APTT  PROTIME-INR    EKG None  Radiology CT ANGIO UP EXTREM RIGHT W &/OR WO CONTRAST  Result Date: 08/23/2020 CLINICAL DATA:  55 year old female with history of right upper extremity pain, concern for dissection. EXAM: CT ANGIOGRAPHY UPPER RIGHT EXTREMITY TECHNIQUE: 100 mL Omnipaque 350, intravenous CONTRAST:  187mL OMNIPAQUE IOHEXOL 350 MG/ML SOLN COMPARISON:  None. FINDINGS: Unfortunately this is a nondiagnostic study. Poor arterial bolus timing in addition to quantum mottle secondary to adjacent trunk. Faint visualization of the brachial veins within the right upper extremity. Otherwise no arterial enhancement. The central veins are patent. The visualized mediastinum is within normal limits. There is trace right basilar subsegmental atelectasis. The visualized abdominal and pelvic contents are within normal limits. No acute osseous abnormality is identified. Review of the MIP images confirms the above findings. IMPRESSION: Nondiagnostic upper extremity CT angiography due to multiple factors including quantum mottle and bolus timing. No acute abnormality visualized in the included thorax, abdomen, or pelvis. If there is further clinical concern for arterial etiology of right arm pain, recommend right upper extremity arterial duplex and/or Interventional Radiology consultation for direct angiography. These results were called by telephone at the time of interpretation on 08/23/2020 at 3:07 pm to provider [facility] , who verbally acknowledged these results. Ruthann Cancer, MD [facility] Radiology Electronically Signed   By: Ruthann Cancer MD   On: 08/23/2020 15:18   DG Chest Portable 1 View  Result Date: 08/23/2020 CLINICAL DATA:  Right arm  pain.  EXAM: PORTABLE CHEST 1 VIEW COMPARISON:  August 07, 2020. FINDINGS: Stable cardiomediastinal silhouette. No pneumothorax or pleural effusion is noted. Minimal bibasilar subsegmental atelectasis is noted. Bony thorax is unremarkable. IMPRESSION: Minimal bibasilar subsegmental atelectasis. Electronically Signed   By: Marijo Conception M.D.   On: 08/23/2020 13:19    Procedures Procedures   CRITICAL CARE Performed by: Rodney Booze   Total critical care time: 40 minutes  Critical care time was exclusive of separately billable procedures and treating other patients.  Critical care was necessary to treat or prevent imminent or life-threatening deterioration.  Critical care was time spent personally by me on the following activities: development of treatment plan with patient and/or surrogate as well as nursing, discussions with consultants, evaluation of patient's response to treatment, examination of patient, obtaining history from patient or surrogate, ordering and performing treatments and interventions, ordering and review of laboratory studies, ordering and review of radiographic studies, pulse oximetry and re-evaluation of patient's condition.   Medications Ordered in ED Medications  vancomycin (VANCOCIN) 1-5 GM/200ML-% IVPB (has no administration in time range)  fentaNYL (SUBLIMAZE) injection 50 mcg (50 mcg Intravenous Given 08/23/20 1331)  ondansetron (ZOFRAN) injection 4 mg (4 mg Intravenous Given 08/23/20 1331)  iohexol (OMNIPAQUE) 350 MG/ML injection 100 mL (100 mLs Intravenous Contrast Given 08/23/20 1347)    ED Course  I have reviewed the triage vital signs and the nursing notes.  Pertinent labs & imaging results that were available during my care of the patient were reviewed by me and considered in my medical decision making (see chart for details).    MDM Rules/Calculators/A&P                          55 y/o F presenting with sudden onset RUE pain this AM. Unable to  palpate RUE pulse. Radial/ulnar pulses faintly heard with doppler. Vascular paged  12:44 PM CONSULT with Dr. Carlis Abbott with vascular surgery. Recommends transferring the patient to [facility] for eval via ED to ED transfer. Recommends CTA RUE and holding on heparin until result.  12:55 PM Discussed case with Dr. Reather Converse who accepts patient for transfer  Reviewed/interpreted labs CBC with mild anemia BMP unremarkable APTT wnl INR wnl  EKG with NSR, LVH, borderline prolonged QT  CXR - Minimal bibasilar subsegmental atelectasis. No widened mediustinum CTA chest - Nondiagnostic upper extremity CT angiography due to multiple factors including quantum mottle and bolus timing. No acute abnormality visualized in the included thorax, abdomen, or pelvis. If there is further clinical concern for arterial etiology of right arm pain, recommend right upper extremity arterial duplex and/or Interventional Radiology consultation for direct angiography.  Pt transferred to [facility] Emergency Department.  Discussed CT result with Dr. Carlis Abbott who is aware of result. Pt currently in pre-op. He will decide next steps.   Final Clinical Impression(s) / ED Diagnoses Final diagnoses:  Limb ischemia    Rx / DC Orders ED Discharge Orders    None       Bishop Dublin 08/23/20 1547    Milton Ferguson, MD 08/27/20 [phone]

## 2020-08-23 NOTE — Anesthesia Preprocedure Evaluation (Signed)

## 2020-08-23 NOTE — ED Notes (Signed)
Patient back from CT.

## 2020-08-23 NOTE — Anesthesia Postprocedure Evaluation (Signed)
Anesthesia Post Note  Patient: Theresa Pearson  Procedure(s) Performed: Right Upper Extremity Thrombectomy (Right Arm Upper)     Patient location during evaluation: PACU Anesthesia Type: General Level of consciousness: awake and alert Pain management: pain level controlled Vital Signs Assessment: post-procedure vital signs reviewed and stable Respiratory status: spontaneous breathing, nonlabored ventilation, respiratory function stable and patient connected to nasal cannula oxygen Cardiovascular status: blood pressure returned to baseline and stable Postop Assessment: no apparent nausea or vomiting Anesthetic complications: no   No complications documented.  Last Vitals:  Vitals:   08/23/20 1324 08/23/20 1426  BP: (!) 152/83   Pulse: 79   Resp: (!) 21   Temp:  36.6 C  SpO2: 100%     Last Pain:  Vitals:   08/23/20 1521  TempSrc:   PainSc: 3                  Kaye Mitro COKER

## 2020-08-23 NOTE — ED Notes (Signed)
Patient transported to CT 

## 2020-08-23 NOTE — Op Note (Addendum)
Date: August 23, 2020  Preoperative diagnosis: Acutely ischemic right arm  Postoperative diagnosis: Same  Procedure: Right upper extremity thrombectomy  Surgeon: Dr. Marty Heck, MD  Assistant: Risa Grill, PA  Indications: Patient is a 55 year old female who presented to the [facility] ED with acute onset right arm pain and numbness in the hand.  The ED could not find a pulse at the right wrist and the hand was reported as pale.  Ultimately CTA upper extremity was ordered and she was transferred here for further evaluation after risk benefits discussed.  I evaluated her in holding and although the CTA was poorly diagnostic she had acute symptoms with a palpable subclavian pulse and no axillary or radial pulse with numbness in the hand suggesting thromboembolic event.  I recommended thrombectomy of her right arm after risks and benefits discussed.  An assistant was needed for exposure and to expedite the case.  Findings: She had a high bifurcation up in the proximal arm near the axilla.  After cutdown here there was no palpable pulse.  I ultimately got control of the bifurcation and opened the axillary artery with 11 blade with no inflow.  I passed a #3 Fogarty proximal into the subclavian artery with no resistance and retrieved a large plug of thrombus and then had brisk pulsatile inflow.  We had good backbleeding from the bifurcation so Fogarty was not passed distally.  The arteriotomy was closed with interrupted 6-0 Prolene's.  She has a palpable pulse at the wrist  Anesthesia: General  Details: Patient was taken to the operating room after informed consent was obtained.  Placed on the operative table supine position.  Right arm was then prepped and draped in the usual sterile fashion up to the axilla after she was asleep.  I initially used ultrasound to evaluate her artery and the high bifurcation up near the axilla and this was marked.  I made a longitudinal incision over the  artery and dissected down through the subcutaneous tissue with bovie cautery and dissected out the artery.  The nerves and veins were protected here.  Ultimately I got Vesseloops on the bifurcation and opened the axillary artery with 11 blade scalpel extended with Potts scissors.  There was no inflow here.  There was backbleeding distally.  I passed a #3 Fogarty proximally with no resistance and then retrieved a large plug of thrombus (see picture below).  There was then brisk pulsatile inflow.  A Henley clamp was then used for proximal control.  I had good backbleeding from the vessels distally and on the CTA there was no distal occlusion and elected not to pass Fogarty's distally.  The arteriotomy was then closed with mulitple 6-0 Prolene interrupted fashion.  We had good hemostasis.  There was a palpable radial pulse at the wrist.  The incision was closed in multiple layers of 3-0 Vicryl and 4-0 Monocryl.  She will be started on heparin drip.  She remained stable throughout the case.  Complication: None  Condition: Stable    Marty Heck, MD [facility] [place] Office: [place]

## 2020-08-23 NOTE — ED Notes (Signed)
[facility] report given, ETA is 10 mins.

## 2020-08-24 ENCOUNTER — Encounter (HOSPITAL_COMMUNITY): Payer: Self-pay | Admitting: Vascular Surgery

## 2020-08-24 ENCOUNTER — Inpatient Hospital Stay (HOSPITAL_COMMUNITY): Payer: Medicaid Other

## 2020-08-24 ENCOUNTER — Other Ambulatory Visit: Payer: Self-pay

## 2020-08-24 DIAGNOSIS — I361 Nonrheumatic tricuspid (valve) insufficiency: Secondary | ICD-10-CM

## 2020-08-24 DIAGNOSIS — I742 Embolism and thrombosis of arteries of the upper extremities: Secondary | ICD-10-CM

## 2020-08-24 DIAGNOSIS — I10 Essential (primary) hypertension: Secondary | ICD-10-CM

## 2020-08-24 LAB — ECHOCARDIOGRAM COMPLETE BUBBLE STUDY
AR max vel: 1.63 cm2
AV Area VTI: 1.84 cm2
AV Area mean vel: 1.62 cm2
AV Mean grad: 8 mmHg
AV Peak grad: 14.6 mmHg
Ao pk vel: 1.91 m/s
Area-P 1/2: 2.76 cm2
MV VTI: 2.27 cm2
S' Lateral: 2.1 cm

## 2020-08-24 LAB — CBC
HCT: 34 % — ABNORMAL LOW (ref 36.0–46.0)
Hemoglobin: 10.6 g/dL — ABNORMAL LOW (ref 12.0–15.0)
MCH: 22.1 pg — ABNORMAL LOW (ref 26.0–34.0)
MCHC: 31.2 g/dL (ref 30.0–36.0)
MCV: 71 fL — ABNORMAL LOW (ref 80.0–100.0)
Platelets: 306 10*3/uL (ref 150–400)
RBC: 4.79 MIL/uL (ref 3.87–5.11)
RDW: 17.2 % — ABNORMAL HIGH (ref 11.5–15.5)
WBC: 15.1 10*3/uL — ABNORMAL HIGH (ref 4.0–10.5)
nRBC: 0 % (ref 0.0–0.2)

## 2020-08-24 LAB — HEPARIN LEVEL (UNFRACTIONATED)
Heparin Unfractionated: 0.21 IU/mL — ABNORMAL LOW (ref 0.30–0.70)
Heparin Unfractionated: 0.16 [IU]/mL — ABNORMAL LOW (ref 0.30–0.70)
Heparin Unfractionated: 0.19 IU/mL — ABNORMAL LOW (ref 0.30–0.70)

## 2020-08-24 MED ORDER — HYDROCORTISONE (PERIANAL) 2.5 % EX CREA
TOPICAL_CREAM | Freq: Two times a day (BID) | CUTANEOUS | Status: DC | PRN
  Filled 2020-08-24: qty 28.35

## 2020-08-24 NOTE — Progress Notes (Signed)
[place] for IV Heparin Indication: ischemic RUE  Allergies  Allergen Reactions  . Penicillins Shortness Of Breath and Rash     Patient Measurements: Height: 5\' 5"  (165.1 cm) Weight: 128.2 kg (282 lb 10.1 oz) IBW/kg (Calculated) : 57 Heparin Dosing Weight: 87.4 kg (using estimated pt wt)  Vital Signs: Temp: 97.7 F (36.5 C) (02/10 2009) Temp Source: Oral (02/10 2009) BP: 133/68 (02/10 2009) Pulse Rate: 83 (02/10 2009)  Labs: Recent Labs    08/23/20 1254 08/23/20 1304 08/24/20 0056 08/24/20 1224 08/24/20 2038  HGB 11.1* 12.2 10.6*  --   --   HCT 35.1* 36.0 34.0*  --   --   PLT 309  --  306  --   --   APTT 32  --   --   --   --   LABPROT 12.6  --   --   --   --   INR 1.0  --   --   --   --   HEPARINUNFRC  --   --  0.21* 0.16* 0.19*  CREATININE 0.90 0.80  --   --   --     Estimated Creatinine Clearance: 109.8 mL/min (by C-G formula based on SCr of 0.8 mg/dL).   Medical History: Past Medical History:  Diagnosis Date  . Anemia   . Anxiety   . Arthritis   . Asthma   . Chronic headaches   . Depression   . Diverticulosis   . Fibromyalgia   . Gallstones   . GERD (gastroesophageal reflux disease)   . Hypertension   . Pneumonia   . Right leg DVT ([place])   . Sleep apnea     Assessment: 55 yr old woman with hx of recent COVID infection and remote DVT in leg (on no anticoagulation PTA) was admitted on 2/9 with ischemic RUE. CTA done at [facility] prior to transfer to [facility] was of poor quality and difficult to interpret, per provider note. Pt is S/P RUE thrombectomy 2/9. Pharmacy was consulted to dose IV heparin.   Heparin level ~6.5 hrs after heparin infusion was increased to 1650 units/hr was 0.19 units/ml, which remains below the goal range for this pt. H/H 10.6/34.0, platelets 306. Per RN, no issues with IV or bleeding observed.  Goal of Therapy:  Heparin level 0.3-0.7 units/ml Monitor platelets by anticoagulation  protocol: Yes   Plan:  Increase heparin infusion to 1900 units/hr Check heparin level in 6 hrs Monitor daily heparin level, CBC Monitor for bleeding F/U transition to oral anticoagulant when able  Gillermina Hu, PharmD, BCPS, [facility] Clinical Pharmacist 08/24/2020

## 2020-08-24 NOTE — Progress Notes (Signed)
  Echocardiogram 2D Echocardiogram has been performed.  Theresa Pearson 08/24/2020, 3:11 PM

## 2020-08-24 NOTE — Progress Notes (Addendum)
  Progress Note    08/24/2020 7:36 AM 1 Day Post-Op  Subjective:  Some numbness in forearm but arm feels much better   Vitals:   08/23/20 2340 08/24/20 0339  BP: 120/62 (!) 118/55  Pulse: 97 93  Resp: 20 19  Temp: 98.2 F (36.8 C) 98.2 F (36.8 C)  SpO2: 98% 97%   Physical Exam: Lungs:  Non labored Incisions:  R axilla incision c/d/i Extremities:  Palpable and symmetrical DP and radial pulses Abdomen:  Soft, NT Neurologic: A&O  CBC    Component Value Date/Time   WBC 15.1 (H) 08/24/2020 0056   RBC 4.79 08/24/2020 0056   HGB 10.6 (L) 08/24/2020 0056   HGB 11.4 02/02/2020 1616   HCT 34.0 (L) 08/24/2020 0056   HCT 37.2 02/02/2020 1616   PLT 306 08/24/2020 0056   PLT 413 02/02/2020 1616   MCV 71.0 (L) 08/24/2020 0056   MCV 73 (L) 02/02/2020 1616   MCH 22.1 (L) 08/24/2020 0056   MCHC 31.2 08/24/2020 0056   RDW 17.2 (H) 08/24/2020 0056   RDW 17.5 (H) 02/02/2020 1616   LYMPHSABS 2.1 08/23/2020 1254   LYMPHSABS 2.8 02/02/2020 1616   MONOABS 0.8 08/23/2020 1254   EOSABS 0.1 08/23/2020 1254   EOSABS 0.1 02/02/2020 1616   BASOSABS 0.0 08/23/2020 1254   BASOSABS 0.0 02/02/2020 1616    BMET    Component Value Date/Time   NA 138 08/23/2020 1304   NA 140 02/02/2020 1616   K 4.6 08/23/2020 1304   CL 101 08/23/2020 1304   CO2 26 08/23/2020 1254   GLUCOSE 100 (H) 08/23/2020 1304   BUN 22 (H) 08/23/2020 1304   BUN 15 02/02/2020 1616   CREATININE 0.80 08/23/2020 1304   CREATININE 0.68 06/21/2016 1543   CALCIUM 8.9 08/23/2020 1254   GFRNONAA >60 08/23/2020 1254   GFRNONAA >89 06/21/2016 1543   GFRAA 115 02/02/2020 1616   GFRAA >89 06/21/2016 1543    INR    Component Value Date/Time   INR 1.0 08/23/2020 1254     Intake/Output Summary (Last 24 hours) at 08/24/2020 0736 Last data filed at 08/24/2020 0424 Gross per 24 hour  Intake 2115.54 ml  Output 1075 ml  Net 1040.54 ml     Assessment/Plan:  55 y.o. female is s/p acute R arm ischemia with  thromboembolectomy 1 Day Post-Op   R hand well perfused with palpable radial pulse Continue embolic workup with echo today Continue heparin for now; will transition to DOAC closer to d/c   Dagoberto Ligas, PA-C [facility] [phone] 08/24/2020 7:36 AM  I have seen and evaluated the patient. I agree with the PA note as documented above.  55 year old female postop day 1 status post right upper extremity thrombectomy.  Continue heparin this morning.  She has a palpable radial pulse at the wrist.  Her incision looks good up in the axilla.  Plan echocardiogram today to rule out cardioembolic thrombus.  Marty Heck, MD [facility] [place] Office: [phone]

## 2020-08-24 NOTE — Progress Notes (Signed)
[place] for heparin Indication: ischemic RUE  Allergies  Allergen Reactions  . Penicillins Shortness Of Breath and Rash     Patient Measurements: Height: 5\' 5"  (165.1 cm) Weight: 128.2 kg (282 lb 10.1 oz) IBW/kg (Calculated) : 57 Heparin Dosing Weight: 87.4 kg (using estimated pt wt)  Vital Signs: Temp: 98.2 F (36.8 C) (02/09 2340) Temp Source: Oral (02/09 2340) BP: 120/62 (02/09 2340) Pulse Rate: 97 (02/09 2340)  Labs: Recent Labs    08/23/20 1254 08/23/20 1304 08/24/20 0056  HGB 11.1* 12.2 10.6*  HCT 35.1* 36.0 34.0*  PLT 309  --  306  APTT 32  --   --   LABPROT 12.6  --   --   INR 1.0  --   --   HEPARINUNFRC  --   --  0.21*  CREATININE 0.90 0.80  --     Estimated Creatinine Clearance: 109.8 mL/min (by C-G formula based on SCr of 0.8 mg/dL).  Assessment: 55 y.o. female with RUE thrombus s/p thrombectomy for heparin  Goal of Therapy:  Heparin level 0.3-0.7 units/ml Monitor platelets by anticoagulation protocol: Yes   Plan:  Increase Heparin 1400 units/hr Check heparin level in 8 hours.  Phillis Knack, PharmD, BCPS

## 2020-08-24 NOTE — Progress Notes (Signed)
Noted referral for information on copays for Xarelto and Eliquis- pt listed has having no insurance. Would be eligible for 30 day free of either drug at time of discharge then would need to apply for patient assistance.

## 2020-08-24 NOTE — Progress Notes (Signed)
Theresa Pearson for heparin Indication: ischemic RUE  Allergies  Allergen Reactions  . Penicillins Shortness Of Breath and Rash     Patient Measurements: Height: 5\' 5"  (165.1 cm) Weight: 128.2 kg (282 lb 10.1 oz) IBW/kg (Calculated) : 57 Heparin Dosing Weight: 87.4 kg (using estimated pt wt)  Vital Signs: Temp: 98.5 F (36.9 C) (02/10 0800) Temp Source: Oral (02/10 0800) BP: 149/75 (02/10 0800) Pulse Rate: 92 (02/10 1022)  Labs: Recent Labs    08/23/20 1254 08/23/20 1304 08/24/20 0056 08/24/20 1224  HGB 11.1* 12.2 10.6*  --   HCT 35.1* 36.0 34.0*  --   PLT 309  --  306  --   APTT 32  --   --   --   LABPROT 12.6  --   --   --   INR 1.0  --   --   --   HEPARINUNFRC  --   --  0.21* 0.16*  CREATININE 0.90 0.80  --   --     Estimated Creatinine Clearance: 109.8 mL/min (by C-G formula based on SCr of 0.8 mg/dL).   Medical History: Past Medical History:  Diagnosis Date  . Anemia   . Anxiety   . Arthritis   . Asthma   . Chronic headaches   . Depression   . Diverticulosis   . Fibromyalgia   . Gallstones   . GERD (gastroesophageal reflux disease)   . Hypertension   . Pneumonia   . Right leg DVT ([place])   . Sleep apnea     Medications:  Scheduled:  . escitalopram  20 mg Oral Daily  . lisinopril  40 mg Oral Daily  . metoprolol tartrate  50 mg Oral BID   Infusions:  . heparin 1,400 Units/hr (08/24/20 1125)    Assessment: 55 yr old woman with hx of recent COVID infection and remote DVT in leg (on no anticoagulation PTA) was admitted today with ischemic RUE. CTA done at [facility] prior to transfer to [facility] was of poor quality and difficult to interpret, per provider note. Pt is S/P RUE thrombectomy 2/9. Pharmacy is consulted to dose IV heparin.   Heparin level remains subtherapeutic, no infusion issues or bleeding per RN.  Goal of Therapy:  Heparin level 0.3-0.7 units/ml Monitor platelets by anticoagulation protocol:  Yes   Plan:  Increase heparin to 1650 units/h Check heparin level in 6h   Arrie Senate, PharmD, [place], [facility] Clinical Pharmacist [phone] Please check AMION for all [facility] Pharmacy numbers 08/24/2020

## 2020-08-25 ENCOUNTER — Other Ambulatory Visit: Payer: Self-pay | Admitting: Physician Assistant

## 2020-08-25 LAB — CBC
HCT: 30.5 % — ABNORMAL LOW (ref 36.0–46.0)
Hemoglobin: 9.5 g/dL — ABNORMAL LOW (ref 12.0–15.0)
MCH: 22.4 pg — ABNORMAL LOW (ref 26.0–34.0)
MCHC: 31.1 g/dL (ref 30.0–36.0)
MCV: 71.8 fL — ABNORMAL LOW (ref 80.0–100.0)
Platelets: 302 10*3/uL (ref 150–400)
RBC: 4.25 MIL/uL (ref 3.87–5.11)
RDW: 17.4 % — ABNORMAL HIGH (ref 11.5–15.5)
WBC: 12.5 10*3/uL — ABNORMAL HIGH (ref 4.0–10.5)
nRBC: 0 % (ref 0.0–0.2)

## 2020-08-25 LAB — SURGICAL PATHOLOGY

## 2020-08-25 LAB — HEPARIN LEVEL (UNFRACTIONATED): Heparin Unfractionated: 0.33 IU/mL (ref 0.30–0.70)

## 2020-08-25 MED ORDER — OXYCODONE-ACETAMINOPHEN 5-325 MG PO TABS: 1.0000 | ORAL_TABLET | Freq: Four times a day (QID) | ORAL | 0 refills | Status: DC | PRN

## 2020-08-25 MED ORDER — APIXABAN 5 MG PO TABS: 5.0000 mg | ORAL_TABLET | Freq: Two times a day (BID) | ORAL | Status: DC

## 2020-08-25 MED ORDER — APIXABAN 5 MG PO TABS: 10.0000 mg | ORAL_TABLET | Freq: Two times a day (BID) | ORAL | 0 refills | Status: DC

## 2020-08-25 MED ORDER — APIXABAN 5 MG PO TABS
10.0000 mg | ORAL_TABLET | Freq: Two times a day (BID) | ORAL | Status: DC
  Administered 2020-08-25: 10 mg via ORAL
  Filled 2020-08-25: qty 2

## 2020-08-25 MED ORDER — APIXABAN 5 MG PO TABS: 5.0000 mg | ORAL_TABLET | Freq: Two times a day (BID) | ORAL | 1 refills | Status: DC

## 2020-08-25 MED FILL — OXYCODONE-APAP 5-325MG: 5-325 | 3 days supply | Qty: 15 | Fill #0

## 2020-08-25 MED FILL — ELIQUIS 5 MG TABLET: 5 | 30 days supply | Qty: 74 | Fill #0

## 2020-08-25 NOTE — Progress Notes (Addendum)
  Progress Note    08/25/2020 7:46 AM 2 Days Post-Op  Subjective:  No complaints   Vitals:   08/25/20 0036 08/25/20 0359  BP: (!) 117/54 (!) 109/51  Pulse: 78 65  Resp: 20 20  Temp: 97.9 F (36.6 C) 97.9 F (36.6 C)  SpO2: 97% 98%   Physical Exam: Lungs:  Non labored Incisions:  R axilla incision c/d/i Extremities:  Symmetrical radial pulses Neurologic: A&O  CBC    Component Value Date/Time   WBC 12.5 (H) 08/25/2020 0438   RBC 4.25 08/25/2020 0438   HGB 9.5 (L) 08/25/2020 0438   HGB 11.4 02/02/2020 1616   HCT 30.5 (L) 08/25/2020 0438   HCT 37.2 02/02/2020 1616   PLT 302 08/25/2020 0438   PLT 413 02/02/2020 1616   MCV 71.8 (L) 08/25/2020 0438   MCV 73 (L) 02/02/2020 1616   MCH 22.4 (L) 08/25/2020 0438   MCHC 31.1 08/25/2020 0438   RDW 17.4 (H) 08/25/2020 0438   RDW 17.5 (H) 02/02/2020 1616   LYMPHSABS 2.1 08/23/2020 1254   LYMPHSABS 2.8 02/02/2020 1616   MONOABS 0.8 08/23/2020 1254   EOSABS 0.1 08/23/2020 1254   EOSABS 0.1 02/02/2020 1616   BASOSABS 0.0 08/23/2020 1254   BASOSABS 0.0 02/02/2020 1616    BMET    Component Value Date/Time   NA 138 08/23/2020 1304   NA 140 02/02/2020 1616   K 4.6 08/23/2020 1304   CL 101 08/23/2020 1304   CO2 26 08/23/2020 1254   GLUCOSE 100 (H) 08/23/2020 1304   BUN 22 (H) 08/23/2020 1304   BUN 15 02/02/2020 1616   CREATININE 0.80 08/23/2020 1304   CREATININE 0.68 06/21/2016 1543   CALCIUM 8.9 08/23/2020 1254   GFRNONAA >60 08/23/2020 1254   GFRNONAA >89 06/21/2016 1543   GFRAA 115 02/02/2020 1616   GFRAA >89 06/21/2016 1543    INR    Component Value Date/Time   INR 1.0 08/23/2020 1254     Intake/Output Summary (Last 24 hours) at 08/25/2020 0746 Last data filed at 08/25/2020 0241 Gross per 24 hour  Intake 720 ml  Output 1700 ml  Net -980 ml     Assessment/Plan:  55 y.o. female is s/p thrombectomy RUE 2 Days Post-Op   R hand well perfused with palpable radial pulse Echo negative for embolic  source TOC/pharmacy working on assistance for anticoagulation Home on Eliquis or Xarelto this afternoon when the above is arranged   Dagoberto Ligas, PA-C [facility] [phone] 08/25/2020 7:46 AM  I have seen and evaluated the patient. I agree with the PA note as documented above.  Postop day 2 status post right upper extremity thrombectomy.  She has a palpable radial pulse that is excellent.  Her axillary incision looks good.  Echo yesterday showed no evidence of cardioembolic thrombus.  Shes also had a CTA.  Recent covid illness two weeks ago that may put her at higher risk.  Plan is to transition her to hopefully Eliquis today with assistance of pharmacy and then discharge.  Will arrange follow-up in 1 month.  I suspect she will need lifelong anticoagulation given this is a second thromboembolic event for her.  I offered her outpatient follow-up with heme-onc and she does not want to do that at this time.  Marty Heck, MD [facility] [place] Office: [phone]

## 2020-08-25 NOTE — Discharge Instructions (Signed)
Information on my medicine - ELIQUIS (apixaban)  This medication education was reviewed with me or my healthcare representative as part of my discharge preparation.    Why was Eliquis prescribed for you? Eliquis was prescribed to treat blood clots that may have been found in the veins of your legs (deep vein thrombosis) or in your lungs (pulmonary embolism) and to reduce the risk of them occurring again.  What do You need to know about Eliquis ? The starting dose is 10 mg (two 5 mg tablets) taken TWICE daily for the FIRST SEVEN (7) DAYS, then on Friday 09/01/20  the dose is reduced to ONE 5 mg tablet taken TWICE daily.  Eliquis may be taken with or without food.   Try to take the dose about the same time in the morning and in the evening. If you have difficulty swallowing the tablet whole please discuss with your pharmacist how to take the medication safely.  Take Eliquis exactly as prescribed and DO NOT stop taking Eliquis without talking to the doctor who prescribed the medication.  Stopping may increase your risk of developing a new blood clot.  Refill your prescription before you run out.  After discharge, you should have regular check-up appointments with your healthcare provider that is prescribing your Eliquis.    What do you do if you miss a dose? If a dose of ELIQUIS is not taken at the scheduled time, take it as soon as possible on the same day and twice-daily administration should be resumed. The dose should not be doubled to make up for a missed dose.  Important Safety Information A possible side effect of Eliquis is bleeding. You should call your healthcare provider right away if you experience any of the following: ? Bleeding from an injury or your nose that does not stop. ? Unusual colored urine (red or dark brown) or unusual colored stools (red or black). ? Unusual bruising for unknown reasons. ? A serious fall or if you hit your head (even if there is no  bleeding).  Some medicines may interact with Eliquis and might increase your risk of bleeding or clotting while on Eliquis. To help avoid this, consult your healthcare provider or pharmacist prior to using any new prescription or non-prescription medications, including herbals, vitamins, non-steroidal anti-inflammatory drugs (NSAIDs) and supplements.  This website has more information on Eliquis (apixaban): http://www.eliquis.com/eliquis/home  .

## 2020-08-25 NOTE — Discharge Summary (Signed)
Discharge Summary  Patient ID: Theresa Pearson [id number] 54 y.o. Feb 26, 1966  Admit date: 08/23/2020  Discharge date and time: 08/25/20   Admitting Physician: Marty Heck, MD   Discharge Physician: same  Admission Diagnoses: Embolus of axillary artery [facility]) [I74.2]  Discharge Diagnoses: same  Admission Condition: fair  Discharged Condition: fair  Indication for Admission: Acute ischemia of right upper extremity  Hospital Course: [name] is a 55 year old female who presented to the emergency department with acute ischemia of right upper extremity.  She was noted to have a thrombus in her right axillary artery.  She was brought to the operating room by Dr. Carlis Abbott on 08/23/2020 and underwent thromboembolectomy of right upper extremity.  She tolerated this procedure well and was admitted to the hospital postoperatively.  Hospital stay included embolic work-up.  CTA chest as well as echocardiogram were negative for embolic source.  Pharmacy and transition of care team arranged patient to transition to IV heparin to Eliquis at discharge which will be followed by the [facility].  She will follow-up with Dr. Carlis Abbott in 1 month.  It should also be noted that she maintained a easily palpable right radial pulse throughout her inpatient stay.  She was also prescribed 1 to 2 days of narcotic pain medication for continued postoperative pain control.  She will be discharged in stable condition.  Consults: None  Treatments: surgery: Thromboembolectomy of right upper extremity by Dr. Carlis Abbott on 08/23/2020  Discharge Exam: See progress note 08/25/20 Vitals:   08/25/20 0359 08/25/20 0755  BP: (!) 109/51 131/68  Pulse: 65 88  Resp: 20 20  Temp: 97.9 F (36.6 C) 98.6 F (37 C)  SpO2: 98% 99%     Disposition: Discharge disposition: 01-Home or Self Care       Patient Instructions:  Allergies as of 08/25/2020      Reactions   Penicillins Shortness Of Breath, Rash       Medication List    TAKE these medications   acetaminophen 500 MG tablet Commonly known as: TYLENOL Take 500 mg by mouth every 6 (six) hours as needed for mild pain.   albuterol 108 (90 Base) MCG/ACT inhaler Commonly known as: VENTOLIN HFA Inhale 2 puffs into the lungs every 6 (six) hours as needed for wheezing or shortness of breath.   apixaban 5 MG Tabs tablet Commonly known as: ELIQUIS Take 2 tablets (10 mg total) by mouth 2 (two) times daily for 7 days.   apixaban 5 MG Tabs tablet Commonly known as: ELIQUIS Take 1 tablet (5 mg total) by mouth 2 (two) times daily. Start taking on: September 01, 2020   benzonatate 100 MG capsule Commonly known as: TESSALON Take 1 capsule (100 mg total) by mouth 3 (three) times daily as needed for cough.   chlorthalidone 50 MG tablet Commonly known as: HYGROTON Take 1 tablet (50 mg total) by mouth daily.   cholecalciferol 25 MCG (1000 UNIT) tablet Commonly known as: VITAMIN D3 Take 1,000 Units by mouth daily.   diclofenac sodium 1 % Gel Commonly known as: Voltaren Apply 2 g topically 4 (four) times daily. What changed:   when to take this  reasons to take this   dicyclomine 10 MG capsule Commonly known as: BENTYL Take 1-2 capsules (10-20 mg total) by mouth every 8 (eight) hours as needed for spasms.   escitalopram 20 MG tablet Commonly known as: LEXAPRO Take 1 tablet (20 mg total) by mouth daily.   hydrOXYzine 25 MG tablet Commonly  known as: ATARAX/VISTARIL 1/2 to 1 tid prn anxiety What changed:   how much to take  how to take this  when to take this  reasons to take this   lisinopril 40 MG tablet Commonly known as: ZESTRIL Take 1 tablet (40 mg total) by mouth daily.   metoprolol tartrate 50 MG tablet Commonly known as: LOPRESSOR TAKE 1 TABLET(50 MG) BY MOUTH TWICE DAILY What changed:   how much to take  how to take this  when to take this   MULTIVITAMIN PO Take 1 tablet by mouth daily.   omeprazole  20 MG capsule Commonly known as: PRILOSEC Take 1 capsule (20 mg total) by mouth daily.   oxyCODONE-acetaminophen 5-325 MG tablet Commonly known as: PERCOCET/ROXICET Take 1 tablet by mouth every 6 (six) hours as needed for moderate pain.   potassium chloride SA 20 MEQ tablet Commonly known as: KLOR-CON Take 1 tablet (20 mEq total) by mouth 2 (two) times daily.   TYLENOL ALLERGY SINUS NIGHT PO Take 1 tablet by mouth every 4 (four) hours as needed (headache).   vitamin B-12 1000 MCG tablet Commonly known as: CYANOCOBALAMIN Take 1,000 mcg by mouth daily.   zinc gluconate 50 MG tablet Take 50 mg by mouth daily.      Activity: activity as tolerated Diet: regular diet Wound Care: keep wound clean and dry  Follow-up with Dr. Carlis Abbott in 1 month.  Signed: Dagoberto Ligas, PA-C 08/25/2020 10:31 AM [facility] Office: [phone]

## 2020-08-25 NOTE — Progress Notes (Signed)
[place] for heparin Indication: ischemic RUE  Allergies  Allergen Reactions  . Penicillins Shortness Of Breath and Rash     Patient Measurements: Height: 5\' 5"  (165.1 cm) Weight: 128.2 kg (282 lb 10.1 oz) IBW/kg (Calculated) : 57 Heparin Dosing Weight: 87.4 kg (using estimated pt wt)  Vital Signs: Temp: 97.9 F (36.6 C) (02/11 0359) Temp Source: Oral (02/11 0359) BP: 109/51 (02/11 0359) Pulse Rate: 65 (02/11 0359)  Labs: Recent Labs    08/23/20 1254 08/23/20 1304 08/23/20 1304 08/24/20 0056 08/24/20 1224 08/24/20 2038 08/25/20 0438  HGB 11.1* 12.2  --  10.6*  --   --  9.5*  HCT 35.1* 36.0  --  34.0*  --   --  30.5*  PLT 309  --   --  306  --   --  302  APTT 32  --   --   --   --   --   --   LABPROT 12.6  --   --   --   --   --   --   INR 1.0  --   --   --   --   --   --   HEPARINUNFRC  --   --    < > 0.21* 0.16* 0.19* 0.33  CREATININE 0.90 0.80  --   --   --   --   --    < > = values in this interval not displayed.    Estimated Creatinine Clearance: 109.8 mL/min (by C-G formula based on SCr of 0.8 mg/dL).   Medical History: Past Medical History:  Diagnosis Date  . Anemia   . Anxiety   . Arthritis   . Asthma   . Chronic headaches   . Depression   . Diverticulosis   . Fibromyalgia   . Gallstones   . GERD (gastroesophageal reflux disease)   . Hypertension   . Pneumonia   . Right leg DVT ([place])   . Sleep apnea     Medications:  Scheduled:  . escitalopram  20 mg Oral Daily  . lisinopril  40 mg Oral Daily  . metoprolol tartrate  50 mg Oral BID   Infusions:  . 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 [facility]A done at [facility] prior to transfer to [facility] was of poor quality and difficult to interpret, per provider note. Pt is S/P RUE thrombectomy 2/9. Pharmacy  is consulted to dose IV heparin.   2/11 AM update:  Heparin level therapeutic  Goal of Therapy:  Heparin level 0.3-0.7 units/ml Monitor platelets by anticoagulation protocol: Yes   Plan:  Cont heparin 1900 units/hr 1200 heparin level  Narda Bonds, PharmD, [place] Pharmacist Phone: [phone]

## 2020-08-25 NOTE — Progress Notes (Signed)
[place] for heparin > apixaban  Indication: ischemic RUE  Allergies  Allergen Reactions  . Penicillins Shortness Of Breath and Rash     Patient Measurements: Height: 5\' 5"  (165.1 cm) Weight: 128.2 kg (282 lb 10.1 oz) IBW/kg (Calculated) : 57 Heparin Dosing Weight: 87.4 kg (using estimated pt wt)  Vital Signs: Temp: 98.6 F (37 C) (02/11 0755) Temp Source: Oral (02/11 0755) BP: 131/68 (02/11 0755) Pulse Rate: 88 (02/11 0755)  Labs: Recent Labs    08/23/20 1254 08/23/20 1304 08/23/20 1304 08/24/20 0056 08/24/20 1224 08/24/20 2038 08/25/20 0438  HGB 11.1* 12.2  --  10.6*  --   --  9.5*  HCT 35.1* 36.0  --  34.0*  --   --  30.5*  PLT 309  --   --  306  --   --  302  APTT 32  --   --   --   --   --   --   LABPROT 12.6  --   --   --   --   --   --   INR 1.0  --   --   --   --   --   --   HEPARINUNFRC  --   --    < > 0.21* 0.16* 0.19* 0.33  CREATININE 0.90 0.80  --   --   --   --   --    < > = values in this interval not displayed.    Estimated Creatinine Clearance: 109.8 mL/min (by C-G formula based on SCr of 0.8 mg/dL).   Medical History: Past Medical History:  Diagnosis Date  . Anemia   . Anxiety   . Arthritis   . Asthma   . Chronic headaches   . Depression   . Diverticulosis   . Fibromyalgia   . Gallstones   . GERD (gastroesophageal reflux disease)   . Hypertension   . Pneumonia   . Right leg DVT ([place])   . Sleep apnea     Medications:  Scheduled:  . apixaban  10 mg Oral BID   Followed by  . [START ON 09/01/2020] apixaban  5 mg Oral BID  . escitalopram  20 mg Oral Daily  . lisinopril  40 mg Oral Daily  . metoprolol tartrate  50 mg Oral BID   Infusions:  . 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 CTA done at [facility] prior to  transfer to [facility]. Pt is S/P RUE thrombectomy 2/9. Pharmacy is consulted to dose IV heparin.   Heparin drip 1900 uts/hr HL 0.33 at goal, CBC stable no bleeding.  Plan to change iv heparin to apixaban and DC home. Will give 30d free card and she is set up with [facility] to complete patient assistance for free med - as pt is without insurance but has applied for medicaid    Goal of Therapy:  Heparin level 0.3-0.7 units/ml Monitor platelets by anticoagulation protocol: Yes   Plan:  Stop heparin after apixaban started apixaban 10mg  BID x7 days then 5mg  BID Patient is educated    [employer].D. CPP, BCPS Clinical Pharmacist [phone] 08/25/2020 8:13 AM

## 2020-08-25 NOTE — TOC Transition Note (Addendum)
Transition of Care (TOC) - CM/SW Discharge Note Marvetta Gibbons RN, BSN Transitions of Care Unit 4E- RN Case Manager See Treatment Team for direct phone #    Patient Details  Name: Theresa Pearson MRN: [id number] Date of Birth: May 09, 1966  Transition of [facility]) CM/SW Contact:  Dawayne Patricia, RN Phone Number: 08/25/2020, 11:57 AM   Clinical Narrative:    Pt stable for transition home, pt has been started on Eliquis- TOC pharmacy to fill 30 day free and deliver to bedside, pt is un-insured and is followed at the [facility]- pt will f/u up with clinic for pt assistance program for Eliquis- pt assistance application provided to pt at the bedside.   Per pt she will need assistance with transportation home, will use [facility], pt is on home 02 baseline through [employer] however does not have a transport tank here, call made to [place] with [employer] to bring transport tank to room for pt for transport home needs.  02 tank to be delivered to room.    Unit secretary can call for [facility] once TOC has delivered meds and 02 tank delivered.   Final next level of care: Home/Self Care Barriers to Discharge: No Barriers Identified   Patient Goals and CMS Choice Patient states their goals for this hospitalization and ongoing recovery are:: return home   Choice offered to / list presented to : NA  Discharge Placement               Home        Discharge Plan and Services   Discharge Planning Services: CM Consult,Medication Assistance            DME Arranged: Oxygen (transport tank) DME Agency: [employer] Date DME Agency Contacted: 08/25/20 Time DME Agency Contacted: 1173 Representative spoke with at DME Agency: [place]: NA [place] Agency: NA        Social Determinants of Health ([place]) Interventions     Readmission Risk Interventions Readmission Risk Prevention Plan 08/25/2020  Transportation Screening Complete  PCP or Specialist Appt within 5-7 Days Complete   Home Care Screening Complete  Medication Review (RN CM) Complete  Some recent data might be hidden

## 2020-08-28 ENCOUNTER — Telehealth: Payer: Self-pay

## 2020-08-28 ENCOUNTER — Ambulatory Visit (INDEPENDENT_AMBULATORY_CARE_PROVIDER_SITE_OTHER): Payer: Self-pay | Admitting: Nurse Practitioner

## 2020-08-28 VITALS — BP 135/74 | HR 81 | Temp 97.1°F

## 2020-08-28 DIAGNOSIS — Z8616 Personal history of COVID-19: Secondary | ICD-10-CM | POA: Insufficient documentation

## 2020-08-28 DIAGNOSIS — I742 Embolism and thrombosis of arteries of the upper extremities: Secondary | ICD-10-CM

## 2020-08-28 DIAGNOSIS — J9601 Acute respiratory failure with hypoxia: Secondary | ICD-10-CM

## 2020-08-28 NOTE — Patient Instructions (Addendum)
History of covid Respiratory Failure:    Stay well hydrated  Stay active  Deep breathing exercises  May take tylenol or fever or pain  May take mucinex twice daily  Please keep upcoming appointment with PCP  Will need follow up imaging in 3 weeks  May wean O2 to keep sats above 93%  Continue current medications    Thromboembolectomy:  Please keep upcoming appointment with vascular  Continue Eliquis  Keep incision clean and dry:    Tissue Adhesive Wound Care Some cuts and wounds can be closed with skin glue (tissue adhesive). Skin glue holds the skin together and helps your wound heal faster. Skin glue goes away on its own as your wound gets better. It is important to take good care of your wound at home while it heals. Follow these instructions at home: Wound care  If a bandage (dressing) was put on the wound, keep it clean and dry.  Follow instructions from your doctor about how often to change the bandage. ? Wash your hands for at least 20 seconds with soap and water before and after you change your bandage. If you cannot use soap and water, use hand sanitizer. ? Change the bandage as often as told by your doctor. ? Leave skin glue in place. It will fall off on its own after 7-10 days.  Do not scratch, rub, or pick at the skin glue.  Do not put tape over the skin glue. The skin glue could come off when you take the tape off.  Protect the wound from another injury.  Check your wound area every day for signs of infection. Check for: ? More redness, swelling, or pain. ? Fluid or blood. ? Warmth. ? Pus or a bad smell.      Bathing  Do not take baths, swim, or use a hot tub until your doctor approves. You may only be allowed to take sponge baths. Ask your doctor if you may take showers. ? Showers are usually allowed 24 hours after treatment. ? Cover the dressing with a watertight covering when you take a shower.  Do not soak the area where skin glue has  been used.  Do not use soaps or creams on your wound. Eating and drinking  Eat healthy foods to help the wound heal. As told by your doctor, eat a diet that includes protein, vitamin A, vitamin C, and other nutrients. You should eat: ? Foods rich in protein. These include meat, fish, eggs, dairy, beans, and nuts. ? Foods rich in vitamin A. These include carrots and dark green, leafy vegetables. ? Foods rich in vitamin C. These include oranges, tomatoes, broccoli, and peppers.  Drink enough fluid to keep your pee (urine) pale yellow. General instructions  Protect your wound from the sun when you are outside for the first 6 months, or for as long as told by your doctor. Put on sunscreen with an SPF of 30 or higher around the scar, or cover it up.  Take over-the-counter and prescription medicines only as told by your doctor.  Do not use any products that contain nicotine or tobacco, such as cigarettes, e-cigarettes, and chewing tobacco. These can delay wound healing. If you need help quitting, ask your doctor.  Keep all follow-up visits as told by your doctor. This is important. Contact a doctor if:  The glue used on your wound gets soaked with blood or falls off before your wound has healed. The glue may need to be replaced.  You have a fever or chills. Get help right away if:  Your wound breaks open.  You have any of these signs of infection: ? More redness, swelling, or pain around your wound. ? A red streak at the area around your wound. ? Fluid or blood coming from your wound. ? Warmth coming from your wound. ? Pus or a bad smell coming from your wound.  You get a rash after the glue is put on. Summary  Some cuts and wounds can be closed with skin glue (tissue adhesive). Skin glue holds the skin together and helps your wound heal faster.  It is important to take good care of your wound at home while it heals.  Wash your hands for at least 20 seconds with soap and water  before and after you change your bandage.  Eat healthy foods.  Check your wound every day for signs of infection. This information is not intended to replace advice given to you by your health care provider. Make sure you discuss any questions you have with your health care provider. Document Revised: 05/28/2019 Document Reviewed: 05/28/2019 Elsevier Patient Education  [place].    Follow up:  Follow up in 2 weeks or sooner if needed

## 2020-08-28 NOTE — Telephone Encounter (Signed)
Transition Care Management Follow-up Telephone Call  Date of discharge and from where: 08/25/2020, [facility]   How have you been since you were released from the hospital? She said it has not been easy.  She was concerned after she arrived home 08/25/2020 when she became lightheaded. She said that her O2 sat was 93%. She increased her O2 to 3L/min and has been using that continuously.  No lightheadedness since then.  Any questions or concerns? Yes - noted above  Items Reviewed:  Did the pt receive and understand the discharge instructions provided? Yes   Medications obtained and verified? Yes  -she has all medications and did not have any questions about the med regime.  She stated the correct orders for eliquis.   Other? No   Any new allergies since your discharge? No   Do you have support at home? Yes , he sons  [place] and Equipment/Supplies: Were home health services ordered? no If so, what is the name of the agency? n/a  Has the agency set up a time to come to the patient's home? not applicable Were any new equipment or medical supplies ordered?  No What is the name of the medical supply agency? n/a Were you able to get the supplies/equipment? not applicable Do you have any questions related to the use of the equipment or supplies? No   She already has O2 at home from [facility].   Functional Questionnaire: (I = Independent and D = Dependent) ADLs: independent  Follow up appointments reviewed:   PCP Hospital f/u appt confirmed? Yes appointment today, 08/28/2020 at [facility] [place] [facility].  Also has appointment with Dr Wynetta Emery 09/05/2020  [facility] f/u appt confirmed? Yes  - [facility] 09/12/2020.   Are transportation arrangements needed? she said she usually drives but [facility] transportation has been arranged for her appointment today  If their condition worsens, is the pt aware to call PCP or go to the Emergency Dept.? Yes  Was the patient provided with contact  information for the PCP's office or ED? Yes  Was to pt encouraged to call back with questions or concerns? Yes

## 2020-08-28 NOTE — Assessment & Plan Note (Signed)
Respiratory Failure:    Stay well hydrated  Stay active  Deep breathing exercises  May take tylenol or fever or pain  May take mucinex twice daily  Please keep upcoming appointment with PCP  Will need follow up imaging in 3 weeks  May wean O2 to keep sats above 93%  Continue current medications    Thromboembolectomy:  Please keep upcoming appointment with vascular  Continue Eliquis  Keep incision clean and dry:

## 2020-08-28 NOTE — Progress Notes (Signed)
@Patient  ID: Theresa Pearson, female    DOB: 08/18/1965, 55 y.o.   MRN: [id number]  Chief Complaint  Patient presents with   Hospitalization Follow-up    Admitted 08/23/20 - 08/25/20 - thromboembolectomy    Referring provider: Ladell Pier, MD   55 year old female with history of hypertension, right leg DVT, asthma, sleep apnea, GERD, anxiety, depression, fibromyalgia  HPI  Patient presents today for post COVID care clinic visit/transition of care visit. Patient was originally admitted to the hospital on 08/08/2020 for Covid pneumonia and acute hypoxic respiratory failure. She was discharged home on oxygen at 2 L nasal cannula. She returned to the hospital and was readmitted on 08/23/2020 for right limb ischemia. She underwent thromboembolectomy of right upper arm by Dr. Carlis Abbott on 08/23/2020. She does have healing incision to the right axilla area. There are no signs or symptoms of infection today noted. She has had no drainage. She does have slight bruising to the area. She has been keeping the area clean and dry. She was discharged home on Eliquis and is expected to have to continue on this for life. She does have remote history of DVT to lower extremity. Patient states that she is still short of breath with exertion but does still have oxygen. Her O2 sats dropped a few days ago with exertion so she has been using her O2 at 3 L nasal cannula continuously. Her O2 sats on oxygen upon arrival today are 100%. We discussed that she can try to wean O2 as tolerated to keep O2 sats above 93%. Patient is ambulating well. She does have her upcoming appointments for follow-up with PCP vascular surgery scheduled and is aware of these appointment times. She does have all the medications that she is prescribed at this time and does not need refills. Denies f/c/s, n/v/d, hemoptysis, PND, chest pain or edema.      Allergies  Allergen Reactions   Penicillins Shortness Of Breath and Rash    Immunization  History  Administered Date(s) Administered   Influenza,inj,Quad PF,6+ Mos 09/18/2015, 06/16/2018, 03/12/2019    Past Medical History:  Diagnosis Date   Anemia    Anxiety    Arthritis    Asthma    Chronic headaches    Depression    Diverticulosis    Fibromyalgia    Gallstones    GERD (gastroesophageal reflux disease)    Hypertension    Pneumonia    Right leg DVT ([place])    Sleep apnea     Tobacco History: Social History   Tobacco Use  Smoking Status Never Smoker  Smokeless Tobacco Never Used   Counseling given: Yes   Outpatient Encounter Medications as of 08/28/2020  Medication Sig   acetaminophen (TYLENOL) 500 MG tablet Take 500 mg by mouth every 6 (six) hours as needed for mild pain.   albuterol (VENTOLIN HFA) 108 (90 Base) MCG/ACT inhaler Inhale 2 puffs into the lungs every 6 (six) hours as needed for wheezing or shortness of breath.   apixaban (ELIQUIS) 5 MG TABS tablet Take 2 tablets (10 mg total) by mouth 2 (two) times daily for 7 days.   [START ON 09/01/2020] apixaban (ELIQUIS) 5 MG TABS tablet Take 1 tablet (5 mg total) by mouth 2 (two) times daily.   benzonatate (TESSALON) 100 MG capsule Take 1 capsule (100 mg total) by mouth 3 (three) times daily as needed for cough.   chlorthalidone (HYGROTON) 50 MG tablet Take 1 tablet (50 mg total) by mouth daily.   cholecalciferol (  VITAMIN D3) 25 MCG (1000 UNIT) tablet Take 1,000 Units by mouth daily.   diclofenac sodium (VOLTAREN) 1 % GEL Apply 2 g topically 4 (four) times daily. (Patient taking differently: Apply 2 g topically 4 (four) times daily as needed (pain).)   dicyclomine (BENTYL) 10 MG capsule Take 1-2 capsules (10-20 mg total) by mouth every 8 (eight) hours as needed for spasms.   diphenhydrAMINE-PSE-APAP (TYLENOL ALLERGY SINUS NIGHT PO) Take 1 tablet by mouth every 4 (four) hours as needed (headache).   escitalopram (LEXAPRO) 20 MG tablet Take 1 tablet (20 mg total) by mouth daily.    hydrOXYzine (ATARAX/VISTARIL) 25 MG tablet 1/2 to 1 tid prn anxiety (Patient taking differently: Take 12.5-25 mg by mouth every 8 (eight) hours as needed for anxiety. 1/2 to 1 tid prn anxiety)   lisinopril (ZESTRIL) 40 MG tablet Take 1 tablet (40 mg total) by mouth daily.   metoprolol tartrate (LOPRESSOR) 50 MG tablet TAKE 1 TABLET(50 MG) BY MOUTH TWICE DAILY (Patient taking differently: Take 50 mg by mouth 2 (two) times daily. TAKE 1 TABLET(50 MG) BY MOUTH TWICE DAILY)   Multiple Vitamins-Minerals (MULTIVITAMIN PO) Take 1 tablet by mouth daily.   omeprazole (PRILOSEC) 20 MG capsule Take 1 capsule (20 mg total) by mouth daily.   oxyCODONE-acetaminophen (PERCOCET/ROXICET) 5-325 MG tablet Take 1 tablet by mouth every 6 (six) hours as needed for moderate pain.   potassium chloride SA (KLOR-CON) 20 MEQ tablet Take 1 tablet (20 mEq total) by mouth 2 (two) times daily.   vitamin B-12 (CYANOCOBALAMIN) 1000 MCG tablet Take 1,000 mcg by mouth daily.   zinc gluconate 50 MG tablet Take 50 mg by mouth daily.   No facility-administered encounter medications on file as of 08/28/2020.     Review of Systems  Review of Systems  Constitutional: Negative.  Negative for fatigue and fever.  HENT: Negative.   Respiratory: Positive for shortness of breath. Negative for cough.   Cardiovascular: Negative.  Negative for chest pain, palpitations and leg swelling.  Gastrointestinal: Negative.   Skin:       Incision to right axilla   Allergic/Immunologic: Negative.   Neurological: Negative.   Psychiatric/Behavioral: Negative.        Physical Exam  BP 135/74    Pulse 81    Temp (!) 97.1 F (36.2 C)    SpO2 100% Comment: 3 L  Wt Readings from Last 5 Encounters:  08/23/20 282 lb 10.1 oz (128.2 kg)  02/02/20 287 lb (130.2 kg)  03/12/19 261 lb (118.4 kg)  06/16/18 272 lb (123.4 kg)  08/06/17 279 lb 12.8 oz (126.9 kg)     Physical Exam Vitals and nursing note reviewed.  Constitutional:       General: She is not in acute distress.    Appearance: She is well-developed and well-nourished.  Cardiovascular:     Rate and Rhythm: Normal rate and regular rhythm.  Pulmonary:     Effort: Pulmonary effort is normal.     Breath sounds: Normal breath sounds.  Skin:    Comments: Healing incision noted to right axilla. No drainage, no signs or symptoms of inflammation or infection noted.   Neurological:     Mental Status: She is alert and oriented to person, place, and time.  Psychiatric:        Mood and Affect: Mood and affect and mood normal.        Behavior: Behavior normal.       Imaging: DG Chest 2 View  Result  Date: 08/07/2020 CLINICAL DATA:  Shortness of breath EXAM: CHEST - 2 VIEW COMPARISON:  08/06/2016 FINDINGS: Streaky perihilar opacities with patchy left lung base ground-glass opacity. No pleural effusion. Normal heart size. No pneumothorax. IMPRESSION: Streaky perihilar opacities with patchy left lung base ground-glass opacity, suspicious for pneumonia, possible atypical or viral pneumonia Electronically Signed   By: Donavan Foil M.D.   On: 08/07/2020 23:21   CT Chest Wo Contrast  Result Date: 08/08/2020 CLINICAL DATA:  Shortness of breath, cough EXAM: CT CHEST WITHOUT CONTRAST TECHNIQUE: Multidetector CT imaging of the chest was performed following the standard protocol without IV contrast. COMPARISON:  Chest x-ray 08/07/2020 FINDINGS: Cardiovascular: Heart is normal size. Aorta is normal caliber. Mediastinum/Nodes: Borderline scattered mediastinal lymph nodes. No visible axillary or hilar adenopathy. Lungs/Pleura: Patchy bilateral predominantly peripheral ground-glass airspace opacities are noted concerning for atypical infection. No effusions. Upper Abdomen: Imaging into the upper abdomen demonstrates no acute findings. Musculoskeletal: Chest wall soft tissues are unremarkable. No acute bony abnormality. IMPRESSION: Patchy peripheral ground-glass opacities concerning for  atypical/viral pneumonia. COVID pneumonia could have this appearance. Electronically Signed   By: Rolm Baptise M.D.   On: 08/08/2020 17:21   CT ANGIO UP EXTREM RIGHT W &/OR WO CONTRAST  Result Date: 08/23/2020 CLINICAL DATA:  55 year old female with history of right upper extremity pain, concern for dissection. EXAM: CT ANGIOGRAPHY UPPER RIGHT EXTREMITY TECHNIQUE: 100 mL Omnipaque 350, intravenous CONTRAST:  114mL OMNIPAQUE IOHEXOL 350 MG/ML SOLN COMPARISON:  None. FINDINGS: Unfortunately this is a nondiagnostic study. Poor arterial bolus timing in addition to quantum mottle secondary to adjacent trunk. Faint visualization of the brachial veins within the right upper extremity. Otherwise no arterial enhancement. The central veins are patent. The visualized mediastinum is within normal limits. There is trace right basilar subsegmental atelectasis. The visualized abdominal and pelvic contents are within normal limits. No acute osseous abnormality is identified. Review of the MIP images confirms the above findings. IMPRESSION: Nondiagnostic upper extremity CT angiography due to multiple factors including quantum mottle and bolus timing. No acute abnormality visualized in the included thorax, abdomen, or pelvis. If there is further clinical concern for arterial etiology of right arm pain, recommend right upper extremity arterial duplex and/or Interventional Radiology consultation for direct angiography. These results were called by telephone at the time of interpretation on 08/23/2020 at 3:07 pm to provider [facility] , who verbally acknowledged these results. Ruthann Cancer, MD [facility] Electronically Signed   By: Ruthann Cancer MD   On: 08/23/2020 15:18   DG Chest Portable 1 View  Result Date: 08/23/2020 CLINICAL DATA:  Right arm pain. EXAM: PORTABLE CHEST 1 VIEW COMPARISON:  August 07, 2020. FINDINGS: Stable cardiomediastinal silhouette. No  pneumothorax or pleural effusion is noted. Minimal bibasilar subsegmental atelectasis is noted. Bony thorax is unremarkable. IMPRESSION: Minimal bibasilar subsegmental atelectasis. Electronically Signed   By: Marijo Conception M.D.   On: 08/23/2020 13:19   ECHOCARDIOGRAM COMPLETE BUBBLE STUDY  Result Date: 08/24/2020    ECHOCARDIOGRAM REPORT   Patient Name:   Theresa Pearson Date of Exam: 08/24/2020 Medical Rec #:  [id number]      Height:       65.0 in Accession #:    [id number]     Weight:       282.6 lb Date of Birth:  1966-02-25      BSA:          2.291 m Patient Age:    34 years  BP:           145/77 mmHg Patient Gender: F              HR:           76 bpm. Exam Location:  Inpatient Procedure: 2D Echo, Cardiac Doppler, Color Doppler and Saline Contrast Bubble            Study Indications:    Embolus of axillary artery  History:        Patient has prior history of Echocardiogram examinations, most                 recent 12/21/2014. Risk Factors:Hypertension. H/O Covid-19                 infection.  Sonographer:    Clayton Lefort RDCS (AE) Referring Phys: [id number] [facility]  Sonographer Comments: Image acquisition challenging due to patient body habitus. IMPRESSIONS  1. Left ventricular ejection fraction, by estimation, is 65 to 70%. The left ventricle has normal function. The left ventricle has no regional wall motion abnormalities. There is mild concentric left ventricular hypertrophy. Left ventricular diastolic parameters were normal.  2. Right ventricular systolic function is normal. The right ventricular size is normal. There is normal pulmonary artery systolic pressure.  3. Right atrial size was mildly dilated.  4. The mitral valve is normal in structure. Trivial mitral valve regurgitation. No evidence of mitral stenosis.  5. The aortic valve is tricuspid. Aortic valve regurgitation is not visualized. No aortic stenosis is present.  6. The inferior vena cava is dilated in size with <50% respiratory  variability, suggesting right atrial pressure of 15 mmHg.  7. Agitated saline contrast bubble study was negative, with no evidence of any interatrial shunt. Comparison(s): No significant change from prior study. Conclusion(s)/Recommendation(s): Normal biventricular function without evidence of hemodynamically significant valvular heart disease. No intracardiac source of embolism detected on this transthoracic study. A transesophageal echocardiogram is recommended to exclude cardiac source of embolism if clinically indicated. FINDINGS  Left Ventricle: Left ventricular ejection fraction, by estimation, is 65 to 70%. The left ventricle has normal function. The left ventricle has no regional wall motion abnormalities. The left ventricular internal cavity size was normal in size. There is  mild concentric left ventricular hypertrophy. Left ventricular diastolic parameters were normal. Right Ventricle: The right ventricular size is normal. No increase in right ventricular wall thickness. Right ventricular systolic function is normal. There is normal pulmonary artery systolic pressure. The tricuspid regurgitant velocity is 1.88 m/s, and  with an assumed right atrial pressure of 15 mmHg, the estimated right ventricular systolic pressure is 54.6 mmHg. Left Atrium: Left atrial size was normal in size. Right Atrium: Right atrial size was mildly dilated. Pericardium: There is no evidence of pericardial effusion. Mitral Valve: The mitral valve is normal in structure. Trivial mitral valve regurgitation. No evidence of mitral valve stenosis. MV peak gradient, 5.9 mmHg. The mean mitral valve gradient is 2.0 mmHg. Tricuspid Valve: The tricuspid valve is normal in structure. Tricuspid valve regurgitation is mild . No evidence of tricuspid stenosis. Aortic Valve: The aortic valve is tricuspid. Aortic valve regurgitation is not visualized. No aortic stenosis is present. Aortic valve mean gradient measures 8.0 mmHg. Aortic valve peak  gradient measures 14.6 mmHg. Aortic valve area, by VTI measures 1.84  cm. Pulmonic Valve: The pulmonic valve was not well visualized. Pulmonic valve regurgitation is not visualized. No evidence of pulmonic stenosis. Aorta: The aortic root, ascending aorta and  aortic arch are all structurally normal, with no evidence of dilitation or obstruction. Venous: The inferior vena cava is dilated in size with less than 50% respiratory variability, suggesting right atrial pressure of 15 mmHg. IAS/Shunts: The atrial septum is grossly normal. Agitated saline contrast was given intravenously to evaluate for intracardiac shunting. Agitated saline contrast bubble study was negative, with no evidence of any interatrial shunt.  LEFT VENTRICLE PLAX 2D LVIDd:         4.70 cm  Diastology LVIDs:         2.10 cm  LV e' medial:    7.29 cm/s LV PW:         1.20 cm  LV E/e' medial:  13.6 LV IVS:        1.50 cm  LV e' lateral:   8.81 cm/s LVOT diam:     1.80 cm  LV E/e' lateral: 11.2 LV SV:         68 LV SV Index:   30 LVOT Area:     2.54 cm  RIGHT VENTRICLE             IVC RV Basal diam:  3.60 cm     IVC diam: 2.20 cm RV S prime:     12.10 cm/s TAPSE (M-mode): 2.9 cm LEFT ATRIUM             Index       RIGHT ATRIUM           Index LA diam:        3.00 cm 1.31 cm/m  RA Area:     19.10 cm LA Vol (A2C):   32.8 ml 14.31 ml/m RA Volume:   54.10 ml  23.61 ml/m LA Vol (A4C):   46.5 ml 20.29 ml/m LA Biplane Vol: 39.2 ml 17.11 ml/m  AORTIC VALVE AV Area (Vmax):    1.63 cm AV Area (Vmean):   1.62 cm AV Area (VTI):     1.84 cm AV Vmax:           191.00 cm/s AV Vmean:          132.000 cm/s AV VTI:            0.370 m AV Peak Grad:      14.6 mmHg AV Mean Grad:      8.0 mmHg LVOT Vmax:         122.00 cm/s LVOT Vmean:        83.800 cm/s LVOT VTI:          0.267 m LVOT/AV VTI ratio: 0.72  AORTA Ao Root diam: 2.90 cm Ao Asc diam:  2.60 cm MITRAL VALVE               TRICUSPID VALVE MV Area (PHT): 2.76 cm    TR Peak grad:   14.1 mmHg MV Area VTI:    2.27 cm    TR Vmax:        188.00 cm/s MV Peak grad:  5.9 mmHg MV Mean grad:  2.0 mmHg    SHUNTS MV Vmax:       1.21 m/s    Systemic VTI:  0.27 m MV Vmean:      67.1 cm/s   Systemic Diam: 1.80 cm MV Decel Time: 275 msec MV E velocity: 99.00 cm/s MV A velocity: 93.50 cm/s MV E/A ratio:  1.06 Buford Dresser MD Electronically signed by Buford Dresser MD Signature Date/Time: 08/24/2020/4:15:45 PM    Final  Assessment & Plan:   History of COVID-19 Respiratory Failure:    Stay well hydrated  Stay active  Deep breathing exercises  May take tylenol or fever or pain  May take mucinex twice daily  Please keep upcoming appointment with PCP  Will need follow up imaging in 3 weeks  May wean O2 to keep sats above 93%  Continue current medications    Thromboembolectomy:  Please keep upcoming appointment with vascular  Continue Eliquis  Keep incision clean and dry:       Fenton Foy, NP 08/28/2020

## 2020-08-30 ENCOUNTER — Emergency Department (HOSPITAL_COMMUNITY): Payer: Medicaid Other

## 2020-08-30 ENCOUNTER — Encounter (HOSPITAL_COMMUNITY): Payer: Self-pay | Admitting: Emergency Medicine

## 2020-08-30 ENCOUNTER — Ambulatory Visit: Payer: Self-pay | Admitting: *Deleted

## 2020-08-30 ENCOUNTER — Emergency Department (HOSPITAL_COMMUNITY)
Admission: EM | Admit: 2020-08-30 | Discharge: 2020-08-30 | Disposition: A | Discharge status: Home / Self Care | Payer: Medicaid Other | Attending: Emergency Medicine | Admitting: Emergency Medicine

## 2020-08-30 DIAGNOSIS — J45909 Unspecified asthma, uncomplicated: Secondary | ICD-10-CM | POA: Diagnosis not present

## 2020-08-30 DIAGNOSIS — Z8616 Personal history of COVID-19: Secondary | ICD-10-CM | POA: Insufficient documentation

## 2020-08-30 DIAGNOSIS — I10 Essential (primary) hypertension: Secondary | ICD-10-CM | POA: Insufficient documentation

## 2020-08-30 DIAGNOSIS — R0789 Other chest pain: Secondary | ICD-10-CM | POA: Diagnosis not present

## 2020-08-30 DIAGNOSIS — Z79899 Other long term (current) drug therapy: Secondary | ICD-10-CM | POA: Insufficient documentation

## 2020-08-30 DIAGNOSIS — Z7901 Long term (current) use of anticoagulants: Secondary | ICD-10-CM | POA: Insufficient documentation

## 2020-08-30 LAB — CBC
HCT: 32.3 % — ABNORMAL LOW (ref 36.0–46.0)
Hemoglobin: 9.8 g/dL — ABNORMAL LOW (ref 12.0–15.0)
MCH: 22.1 pg — ABNORMAL LOW (ref 26.0–34.0)
MCHC: 30.3 g/dL (ref 30.0–36.0)
MCV: 72.9 fL — ABNORMAL LOW (ref 80.0–100.0)
Platelets: 309 10*3/uL (ref 150–400)
RBC: 4.43 MIL/uL (ref 3.87–5.11)
RDW: 17.6 % — ABNORMAL HIGH (ref 11.5–15.5)
WBC: 8 10*3/uL (ref 4.0–10.5)
nRBC: 0 % (ref 0.0–0.2)

## 2020-08-30 LAB — TROPONIN I (HIGH SENSITIVITY)
Troponin I (High Sensitivity): 3 ng/L (ref <18)
Troponin I (High Sensitivity): 6 ng/L (ref <18)

## 2020-08-30 LAB — BASIC METABOLIC PANEL
Anion gap: 9 (ref 5–15)
BUN: 17 mg/dL (ref 6–20)
CO2: 27 mmol/L (ref 22–32)
Calcium: 9.3 mg/dL (ref 8.9–10.3)
Chloride: 104 mmol/L (ref 98–111)
Creatinine, Ser: 0.78 mg/dL (ref 0.44–1.00)
GFR, Estimated: >60 mL/min (ref >60)
Glucose, Bld: 102 mg/dL — ABNORMAL HIGH (ref 70–99)
Potassium: 3.8 mmol/L (ref 3.5–5.1)
Sodium: 140 mmol/L (ref 135–145)

## 2020-08-30 LAB — I-STAT BETA HCG BLOOD, ED (MC, WL, AP ONLY): I-stat hCG, quantitative: <5 m[IU]/mL (ref <5)

## 2020-08-30 MED ORDER — IOHEXOL 350 MG/ML SOLN
100.0000 mL | Freq: Once | INTRAVENOUS | Status: AC | PRN
  Administered 2020-08-30: 59 mL via INTRAVENOUS

## 2020-08-30 NOTE — ED Triage Notes (Signed)
Pt arrives to ER from home via [facility] with chief complaint of chest pain. She recently was admitted due to a blood clot in her right arm. She has been on 3L [place] since covid in January. She first began to have left sided chest pain at 4am this morning. No back pain she did have some shortness of breath and used her inhaler. She has been having sob since covid.

## 2020-08-30 NOTE — Telephone Encounter (Signed)
Pt called in c/o having chest pain/tightness this morning.   She is covid positive on Jan. 25 and has had bilateral pneumonia.   She is now coughing up dark yellow sputum.   She mentioned she had emergency surgery on her arm last week to have a blood clot removed.  At this point I instructed her to go straight to the ED that she could possibly have a clot in her lung with being covid positive and having had pneumonia too.    She was agreeable to this plan and is having someone take her to the ED now.    Reason for Disposition . Sounds like a life-threatening emergency to the triager    covid positive and just had a blood clot removed from her arm last week emergency surgery now having chest pain.  Answer Assessment - Initial Assessment Questions 1. COVID-19 DIAGNOSIS: "Who made your COVID-19 diagnosis?" "Was it confirmed by a positive lab test or self-test?" If not diagnosed by a doctor (or NP/PA), ask "Are there lots of cases (community spread) where you live?" Note: See public health department website, if unsure.     Covid positive Jan. 25, 2022.    2. COVID-19 EXPOSURE: "Was there any known exposure to COVID before the symptoms began?" CDC Definition of close contact: within 6 feet (2 meters) for a total of 15 minutes or more over a 24-hour period.      *No Answer* 3. ONSET: "When did the COVID-19 symptoms start?"      Jan. 25, 2022  4. WORST SYMPTOM: "What is your worst symptom?" (e.g., cough, fever, shortness of breath, muscle aches)     My chest is waking me up hurting.   I used my inhaler and the pain went away this morning.   My chest is tight feeling.    The after covid care LPN said my sleep apnea probably flared up as a result of the covid.  The pain in my chest and I'm sweating.   I didn't check my temperature last night.   It was normal when  I was sweating before 5. COUGH: "Do you have a cough?" If Yes, ask: "How bad is the cough?"       Yes coughing a lot.  A little dark  yellow mucus.   6. FEVER: "Do you have a fever?" If Yes, ask: "What is your temperature, how was it measured, and when did it start?"     No 7. RESPIRATORY STATUS: "Describe your breathing?" (e.g., shortness of breath, wheezing, unable to speak)      I had pneumonia already with the covid.   I had a clot in my arm last week and I had emergency surgery to remove it.   I instructed her at this point to go straight to the ED now that the chest pain could be a clot in her lung. 8. BETTER-SAME-WORSE: "Are you getting better, staying the same or getting worse compared to yesterday?"  If getting worse, ask, "In what way?"     Just not getting better and now my chest is hurting. 9. HIGH RISK DISEASE: "Do you have any chronic medical problems?" (e.g., asthma, heart or lung disease, weak immune system, obesity, etc.)     Yes had a blood clot removed from her arm last week emergency surgery.  Covid positive on Jan. 25, 2022 10. VACCINE: "Have you had the COVID-19 vaccine?" If Yes, ask: "Which one, how many shots, when did you get it?"  Not asked 11. BOOSTER: "Have you received your COVID-19 booster?" If Yes, ask: "Which one and when did you get it?"       Not asked 12. PREGNANCY: "Is there any chance you are pregnant?" "When was your last menstrual period?"       N/A 13. OTHER SYMPTOMS: "Do you have any other symptoms?"  (e.g., chills, fatigue, headache, loss of smell or taste, muscle pain, sore throat)       *No Answer* 14. O2 SATURATION MONITOR:  "Do you use an oxygen saturation monitor (pulse oximeter) at home?" If Yes, ask "What is your reading (oxygen level) today?" "What is your usual oxygen saturation reading?" (e.g., 95%)       *No Answer*  Protocols used: CORONAVIRUS (COVID-19) DIAGNOSED OR SUSPECTED-A-AH

## 2020-08-30 NOTE — ED Provider Notes (Signed)
[facility] EMERGENCY DEPARTMENT Provider Note   CSN: [id number] Arrival date & time: 08/30/20  1013     History Chief Complaint  Patient presents with  . Chest Pain    Theresa Pearson is a 55 y.o. female presents at the prompting of her PCP with concern for left-sided chest pain that is sharp in nature that woke her from her sleep at 4 AM.  She endorses associated chest tightness, and took her albuterol inhaler at that time with immediate relief of her chest Pain.  She endorses residual chest tightness, however states that this is at her baseline since her discharge from the hospital and for management of COVID-19 in January 2022.  Patient is on 2 L of supplemental oxygen since her discharge for COVID-19, and endorses that her shortness of breath is unchanged from prior.  She denies any palpitations, denies chest pain at this time.  Denies abdominal pain, nausea, vomiting or diarrhea, fevers, chills at home.  She does endorse one episode of diaphoresis yesterday, but did not have a fever at that time.  Additionally she endorses hoarseness of voice since her surgery few days ago.  She denies sore throat or trouble swallowing.  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  Patient has been on Eliquis, 10 mg twice per day since discharge, is scheduled to transition to 5 mg twice daily this week.  She states she has been compliant with her medication as prescribed, and has follow-up appointment scheduled with her primary care doctor for 09/05/2020.  She endorses some soreness around her incision site, but denies any new numbness, tingling, weakness in the right hand.  I personally reviewed this patient's medical records.  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   HPI  HPI: A 55 year old patient with a history of hypertension, hypercholesterolemia and obesity presents for evaluation of chest pain. Initial onset of pain was more than 6 hours ago. The patient's chest pain is well-localized, is described as heaviness/pressure/tightness, is sharp and is not worse with exertion. The patient reports some diaphoresis. The patient's chest pain is middle- or left-sided and does not radiate to the arms/jaw/neck. The patient does not complain of nausea. The patient has no history of stroke, has no history of peripheral artery disease, has not smoked in the past 90 days, denies any history of treated diabetes and has no relevant family history of coronary artery disease (first degree relative at less than age 43).   Past Medical History:  Diagnosis Date  . Anemia   . Anxiety   . Arthritis   . Asthma   . Chronic headaches   . Depression   . Diverticulosis   . Fibromyalgia   . Gallstones   . GERD (gastroesophageal reflux disease)   . Hypertension   . Pneumonia   . Right leg DVT ([place])   . Sleep apnea     Patient Active Problem List   Diagnosis Date Noted  . History of COVID-19 08/28/2020  . 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 COVID-19 virus infection 08/14/2020  . Acute respiratory failure with hypoxia ([place]) 08/14/2020  . Multifocal pneumonia 08/08/2020  . Sepsis due to pneumococcus (  [place]) 08/08/2020  . Prolonged QT interval 08/08/2020  . Epigastric pain 06/24/2016  . Bloody diarrhea 06/24/2016  . Bilateral chronic knee pain 10/16/2015  . Severe obesity (BMI >= 40) ([place]) 10/16/2015  . Anemia 09/19/2015  . Vitamin D deficiency 09/19/2015  . Bilateral impacted cerumen 09/19/2015  . GAD (generalized anxiety disorder) 03/04/2014  . Hypokalemia 06/27/2013  . HTN (hypertension) 06/27/2013  . Sleep apnea  06/27/2013    Past Surgical History:  Procedure Laterality Date  . ABDOMINAL HYSTERECTOMY  2008   for  fibroids   . CESAREAN SECTION    . CHOLECYSTECTOMY    . THROMBECTOMY W/ EMBOLECTOMY Right 08/23/2020   Procedure: Right Upper Extremity Thrombectomy;  Surgeon: Marty Heck, MD;  Location: [facility] OR;  Service: Vascular;  Laterality: Right;     OB History   No obstetric history on file.     Family History  Problem Relation Age of Onset  . CAD Mother   . Diabetes Mellitus II Mother   . Stroke Mother   . Colon polyps Mother   . Crohn's disease Mother   . Allergy (severe) Sister   . Colon polyps Sister   . Allergy (severe) Son   . Prostate cancer Maternal Grandfather   . Diabetes Maternal Grandmother   . Hypertension Maternal Grandmother   . Heart attack Maternal Grandmother     Social History   Tobacco Use  . Smoking status: Never Smoker  . Smokeless tobacco: Never Used  Substance Use Topics  . Alcohol use: Yes    Alcohol/week: 1.0 - 2.0 standard drink    Types: 1 - 2 Glasses of wine per week    Comment: occoasional.  . Drug use: No    Home Medications Prior to Admission medications   Medication Sig Start Date End Date Taking? Authorizing Provider  acetaminophen (TYLENOL) 500 MG tablet Take 500 mg by mouth every 6 (six) hours as needed for mild pain.    [provider]  albuterol (VENTOLIN HFA) 108 (90 Base) MCG/ACT inhaler Inhale 2 puffs into the lungs every 6 (six) hours as needed for wheezing or shortness of breath. 08/14/20   Ladell Pier, MD  apixaban (ELIQUIS) 5 MG TABS tablet Take 2 tablets (10 mg total) by mouth 2 (two) times daily for 7 days. 08/25/20 09/01/20  Dagoberto Ligas, PA-C  apixaban (ELIQUIS) 5 MG TABS tablet Take 1 tablet (5 mg total) by mouth 2 (two) times daily. 09/01/20   Dagoberto Ligas, PA-C  benzonatate (TESSALON) 100 MG capsule Take 1 capsule (100 mg total) by mouth 3 (three) times daily as needed for cough. 08/14/20    Ladell Pier, MD  chlorthalidone (HYGROTON) 50 MG tablet Take 1 tablet (50 mg total) by mouth daily. 08/14/20   Ladell Pier, MD  cholecalciferol (VITAMIN D3) 25 MCG (1000 UNIT) tablet Take 1,000 Units by mouth daily.    [provider]  diclofenac sodium (VOLTAREN) 1 % GEL Apply 2 g topically 4 (four) times daily. Patient taking differently: Apply 2 g topically 4 (four) times daily as needed (pain). 04/21/17   Thurman Coyer, DO  dicyclomine (BENTYL) 10 MG capsule Take 1-2 capsules (10-20 mg total) by mouth every 8 (eight) hours as needed for spasms. 03/12/19   Fulp, Cammie, MD  diphenhydrAMINE-PSE-APAP (TYLENOL ALLERGY SINUS NIGHT PO) Take 1 tablet by mouth every 4 (four) hours as needed (headache).    [provider]  escitalopram (LEXAPRO) 20 MG tablet Take 1 tablet (20 mg  total) by mouth daily. 08/14/20   Ladell Pier, MD  hydrOXYzine (ATARAX/VISTARIL) 25 MG tablet 1/2 to 1 tid prn anxiety Patient taking differently: Take 12.5-25 mg by mouth every 8 (eight) hours as needed for anxiety. 1/2 to 1 tid prn anxiety 02/02/20   Argentina Donovan, PA-C  lisinopril (ZESTRIL) 40 MG tablet Take 1 tablet (40 mg total) by mouth daily. 08/14/20   Ladell Pier, MD  metoprolol tartrate (LOPRESSOR) 50 MG tablet TAKE 1 TABLET(50 MG) BY MOUTH TWICE DAILY Patient taking differently: Take 50 mg by mouth 2 (two) times daily. TAKE 1 TABLET(50 MG) BY MOUTH TWICE DAILY 08/14/20   Ladell Pier, MD  Multiple Vitamins-Minerals (MULTIVITAMIN PO) Take 1 tablet by mouth daily.    [provider]  omeprazole (PRILOSEC) 20 MG capsule Take 1 capsule (20 mg total) by mouth daily. 02/02/20   Argentina Donovan, PA-C  oxyCODONE-acetaminophen (PERCOCET/ROXICET) 5-325 MG tablet Take 1 tablet by mouth every 6 (six) hours as needed for moderate pain. 08/25/20   Dagoberto Ligas, PA-C  potassium chloride SA (KLOR-CON) 20 MEQ tablet Take 1 tablet (20 mEq total) by mouth 2 (two) times  daily. 02/02/20   Argentina Donovan, PA-C  vitamin B-12 (CYANOCOBALAMIN) 1000 MCG tablet Take 1,000 mcg by mouth daily.    [provider]  zinc gluconate 50 MG tablet Take 50 mg by mouth daily.    [provider]    Allergies    Penicillins  Review of Systems   Review of Systems  Constitutional: Positive for diaphoresis. Negative for activity change, appetite change, chills, fatigue and fever.  HENT: Negative.   Eyes: Negative.   Respiratory: Positive for cough, chest tightness and shortness of breath.   Cardiovascular: Positive for chest pain. Negative for palpitations and leg swelling.  Gastrointestinal: Negative.   Genitourinary: Negative.   Musculoskeletal: Negative.   Skin: Negative.   Neurological: Negative.   Hematological: Bruises/bleeds easily.       On eliquis    Physical Exam Updated Vital Signs BP 93/66   Pulse 73   Temp 97.7 F (36.5 C) (Oral)   Resp 15   SpO2 100%   Physical Exam Vitals and nursing note reviewed.  Constitutional:      General: She is not in acute distress.    Appearance: She is obese. She is not ill-appearing or toxic-appearing.     Interventions: Nasal cannula in place.  HENT:     Head: Normocephalic and atraumatic.     Nose: Nose normal.     Mouth/Throat:     Mouth: Mucous membranes are moist.     Pharynx: Oropharynx is clear. Uvula midline. No oropharyngeal exudate or posterior oropharyngeal erythema.     Tonsils: No tonsillar exudate.  Eyes:     General: Lids are normal. Vision grossly intact.        Right eye: No discharge.        Left eye: No discharge.     Extraocular Movements: Extraocular movements intact.     Conjunctiva/sclera: Conjunctivae normal.     Pupils: Pupils are equal, round, and reactive to light.  Neck:     Trachea: Trachea and phonation normal.  Cardiovascular:     Rate and Rhythm: Normal rate and regular rhythm.     Pulses: Normal pulses.          Radial pulses are 2+ on the right side  and 2+ on the left side.       Dorsalis  pedis pulses are 2+ on the right side and 2+ on the left side.     Heart sounds: Normal heart sounds. No murmur heard.   Pulmonary:     Effort: Pulmonary effort is normal. No tachypnea, accessory muscle usage or respiratory distress.     Breath sounds: Examination of the left-lower field reveals decreased breath sounds. Decreased breath sounds present. No wheezing or rales.     Comments: On 2.5 L supplemental O2 via [place] Chest:     Chest wall: Tenderness present. No deformity, swelling, crepitus or edema.    Abdominal:     General: Bowel sounds are normal. There is no distension.     Tenderness: There is no abdominal tenderness.  Musculoskeletal:        General: No deformity.       Arms:     Cervical back: Full passive range of motion without pain, normal range of motion and neck supple. No crepitus. No pain with movement, spinous process tenderness or muscular tenderness.     Right lower leg: No tenderness. No edema.     Left lower leg: No tenderness. No edema.  Lymphadenopathy:     Cervical: No cervical adenopathy.  Skin:    General: Skin is warm and dry.     Capillary Refill: Capillary refill takes less than 2 seconds.  Neurological:     General: No focal deficit present.     Mental Status: She is alert and oriented to person, place, and time. Mental status is at baseline.     Sensory: Sensation is intact.     Motor: Motor function is intact.     Coordination: Coordination is intact.     Gait: Gait is intact.  Psychiatric:        Mood and Affect: Mood normal.     ED Results / Procedures / Treatments   Labs (all labs ordered are listed, but only abnormal results are displayed) Labs Reviewed  BASIC METABOLIC PANEL - Abnormal; Notable for the following components:      Result Value   Glucose, Bld 102 (*)    All other components within normal limits  CBC - Abnormal; Notable for the following components:   Hemoglobin 9.8 (*)    HCT  32.3 (*)    MCV 72.9 (*)    MCH 22.1 (*)    RDW 17.6 (*)    All other components within normal limits  I-STAT BETA HCG BLOOD, ED ([facility], [facility], [facility] ONLY)  TROPONIN I (HIGH SENSITIVITY)  TROPONIN I (HIGH SENSITIVITY)    EKG EKG Interpretation  Date/Time:  Wednesday August 30 2020 10:30:05 EST Ventricular Rate:  75 PR Interval:  172 QRS Duration: 78 QT Interval:  388 QTC Calculation: 433 R Axis:   28 Text Interpretation: Normal sinus rhythm Cannot rule out Anterior infarct , age undetermined Abnormal ECG Confirmed by Dene Gentry [phone]) on 08/30/2020 12:13:19 PM   Radiology DG Chest 2 View  Result Date: 08/30/2020 CLINICAL DATA:  Shortness of breath with cough EXAM: CHEST - 2 VIEW COMPARISON:  August 23, 2020 chest radiograph; chest radiograph August 07, 2020; chest CT August 08, 2020 FINDINGS: Areas of airspace opacity of cleared since previous study. There are areas of atelectatic change in each lung base. Heart size and pulmonary vascularity are normal. No adenopathy. No bone lesions. IMPRESSION: Atelectatic change in each lung base. Areas of previously noted airspace opacity of cleared. No new opacity evident. Heart size normal. Electronically Signed   By: Gwyndolyn Saxon  Jasmine December III M.D.   On: 08/30/2020 11:06   CT Angio Chest PE W/Cm &/Or Wo Cm  Result Date: 08/30/2020 CLINICAL DATA:  Positive D-dimer, chest tightness EXAM: CT ANGIOGRAPHY CHEST WITH CONTRAST TECHNIQUE: Multidetector CT imaging of the chest was performed using the standard protocol during bolus administration of intravenous contrast. Multiplanar CT image reconstructions and MIPs were obtained to evaluate the vascular anatomy. CONTRAST:  18mL OMNIPAQUE IOHEXOL 350 MG/ML SOLN COMPARISON:  08/08/2020 FINDINGS: Cardiovascular: No filling defects in the pulmonary arteries to suggest pulmonary emboli. Cardiomegaly. Aorta normal caliber. Mediastinum/Nodes: No mediastinal, hilar, or axillary adenopathy. Trachea and esophagus are  unremarkable. Thyroid unremarkable. Lungs/Pleura: Linear atelectasis in the lower lobes. No confluent opacities or effusions. Upper Abdomen: Imaging into the upper abdomen demonstrates no acute findings. Musculoskeletal: Chest wall soft tissues are unremarkable. No acute bony abnormality. Review of the MIP images confirms the above findings. IMPRESSION: No evidence of pulmonary embolus. Mild cardiomegaly. Bibasilar atelectasis. Electronically Signed   By: Rolm Baptise M.D.   On: 08/30/2020 14:17    Procedures Procedures   Medications Ordered in ED Medications  iohexol (OMNIPAQUE) 350 MG/ML injection 100 mL (59 mLs Intravenous Contrast Given 08/30/20 1355)    ED Course  I have reviewed the triage vital signs and the nursing notes.  Pertinent labs & imaging results that were available during my care of the patient were reviewed by me and considered in my medical decision making (see chart for details).    MDM Rules/Calculators/A&P HEAR Score: 76                       55 year old female who was recently discharged from the hospital after thromboembolectomy for right upper extremity axillary artery thrombus, on Eliquis, presents today with episode of chest pain, chest tightness.  Patient on 2 L supplemental oxygen since discharge from the hospital in January 2022 following admission for hypoxic respiratory failure secondary to COVID-19.  Vital signs are normal on intake.  Cardiopulmonary exam is reassuring.  Lungs are clear to auscultation with minimal decreased breath sounds in the left lung base.  Abdominal exam is benign.  Patient is asymptomatic at this time, she is neurovascularly intact in all 4 extremities, with symmetric pulses in the radial artery.  Concern for possible PE given newly anticoagulated patient with history of recent artery thrombus, and history of DVT in 1995.  Laboratory states obtained in triage.  CBC with anemia, however stable hemoglobin of 9.8.  BMP unremarkable.   Troponin negative, 3, delta troponin pending.  Chest x-ray with bibasilar atelectasis, EKG with normal sinus rhythm, no STEMI.  Will proceed with delta troponin.  Role of CTA of the chest discussed with attending physician.  Feel be beneficial to rule out pulmonary embolus at this time given high risk of this patient newly on anticoagulation.    CT angiogram negative for pulmonary embolus.  Delta troponin negative, 6; patient's presentation does not appear consistent with ACS at this time.  Given reassuring physical exam, laboratory studies, vital signs, and imaging studies, no further work-up is warranted in the ED at this time.  Recommend close follow-up with your primary care doctor and adherence to anticoagulation as prescribed.  Korynne voiced understanding of her medical evaluation and treatment plan.  Each of her questions was answered to her expressed satisfaction.  Return precautions given.  Patient is well-appearing, stable, and appropriate for discharge at this time.  This chart was dictated using voice recognition software, Dragon. Despite the  best efforts of this provider to proofread and correct errors, errors may still occur which can change documentation meaning.  Final Clinical Impression(s) / ED Diagnoses Final diagnoses:  Other chest pain    Rx / DC Orders ED Discharge Orders    None       Aura Dials 08/30/20 1640    Valarie Merino, MD 09/01/20 [phone]

## 2020-08-30 NOTE — Discharge Instructions (Addendum)
You were evaluated in the emergency department today for your chest pain.  Your physical exam, vital signs, EKG, blood work, and CT scan of your chest were very reassuring.  There are no blood clots in your lungs, there is no new infection in your lungs.  You may continue to take your albuterol inhaler as needed at home for your chest tightness.  Please follow-up closely with your primary care doctor.  Suspect this pain may be secondary to persistent changes following your COVID-19 infection.  Return to the emergency department If you develop any new chest pain, palpitations, worsening shortness of breath, nausea or vomiting that does not stop, reduce your symptoms.

## 2020-09-05 ENCOUNTER — Encounter: Payer: Self-pay | Admitting: Internal Medicine

## 2020-09-05 ENCOUNTER — Other Ambulatory Visit: Payer: Self-pay

## 2020-09-05 ENCOUNTER — Other Ambulatory Visit: Payer: Self-pay | Admitting: Internal Medicine

## 2020-09-05 ENCOUNTER — Ambulatory Visit: Payer: Self-pay | Attending: Internal Medicine | Admitting: Internal Medicine

## 2020-09-05 VITALS — BP 120/82 | HR 70 | Resp 16 | Wt 285.4 lb

## 2020-09-05 DIAGNOSIS — I742 Embolism and thrombosis of arteries of the upper extremities: Secondary | ICD-10-CM

## 2020-09-05 DIAGNOSIS — J9601 Acute respiratory failure with hypoxia: Secondary | ICD-10-CM

## 2020-09-05 DIAGNOSIS — Z2821 Immunization not carried out because of patient refusal: Secondary | ICD-10-CM | POA: Insufficient documentation

## 2020-09-05 DIAGNOSIS — G4733 Obstructive sleep apnea (adult) (pediatric): Secondary | ICD-10-CM

## 2020-09-05 DIAGNOSIS — D509 Iron deficiency anemia, unspecified: Secondary | ICD-10-CM

## 2020-09-05 MED ORDER — APIXABAN 5 MG PO TABS: 5.0000 mg | ORAL_TABLET | Freq: Two times a day (BID) | ORAL | 3 refills | Status: DC

## 2020-09-05 MED ORDER — FERROUS SULFATE 325 (65 FE) MG PO TABS: 325.0000 mg | ORAL_TABLET | Freq: Every day | ORAL | 1 refills | Status: DC

## 2020-09-05 MED FILL — FERROUS SULFATE 325 MG TAB: 325 (65 FE) | 30 days supply | Qty: 30 | Fill #0

## 2020-09-05 NOTE — Progress Notes (Addendum)
Patient ID: Theresa Pearson, female    DOB: 1966-04-19  MRN: [id number]  CC: Transition of care visit Date of hospitalization 2/9-05/2021 Date of call from case worker 08/28/2020  Subjective: Theresa Pearson is a 55 y.o. female who presents for transition of care visit. Her concerns today include:  Patient with history of HTN, prediabetes, anxiety disorder, fibromyalgia, HL, vitamin D deficiency, mild intermittent asthma, mild microcytic anemia, COVID pneumonia 07/2020 on home O2.  Patient had telephone visit with me 3 weeks ago post hospitalization for Covid pneumonia.  She was discharged home on O2 2 L continuous.  Since then, she was hospitalized 2/9-05/2021 with acute ischemic limb of the right upper extremity.  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  She underwent thromboembolectomy of the right upper extremity by Dr. Carlis Abbott.  She was discharged home on Eliquis.  Noted to have slight worsening of anemia.  She was seen by pulmonary nurse practitioner Lazaro Arms on 08/28/2020.  She was encouraged to do deep breathing exercises.  Plan was to wean O2 to keep oxygen saturation above 93.  Seen in the emergency room 08/30/2020 with left-sided chest pain that woke her up from sleep.  EKG revealed no changes compared to EKG done on the 10th of this month.  Cardiac enzymes were negative.  CTA of the chest negative for PE.  Did show some atelectasis at the bases of the lungs.  CBC showed mild to moderate anemia with hemoglobin of 9.8.  Today: Patient reports numbness on the inner aspect of the right arm since having embolectomy.  States that she was told by Dr. Carlis Abbott that feelings should return with time.  She is currently on Eliquis.  No significant bruising or bleeding on the medication.  Gives history of DVT in the right lower extremity back in 2019 post partum.  States that she was told by Dr. Carlis Abbott that since this is the second time she has had a blood clot, she will need to be on  Eliquis indefinitely.  In regards to her breathing, she states that she still uses her albuterol inhaler about every 4-6 hours.  She feels that COVID has aggravated her OSA.  Endorses loud snoring, daytime sleepiness and feeling that she is suffocating at nights.  Reports having been diagnosed with OSA in the past and was on CPAP machine.  She had subsequent sleep test in 2016 that was good and was told that she can discontinue using the CPAP.  She has not had any lower extremity edema.  Still using oxygen 2 L continuously at home.  Patient Active Problem List   Diagnosis Date Noted  . Influenza vaccine refused 09/05/2020  . History of COVID-19 08/28/2020  . 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 COVID-19 virus infection 08/14/2020  . Acute respiratory failure with hypoxia ([place]) 08/14/2020  . Multifocal pneumonia 08/08/2020  . Sepsis due to pneumococcus ([place]) 08/08/2020  . Prolonged QT interval 08/08/2020  . Epigastric pain 06/24/2016  . Bloody diarrhea 06/24/2016  . Bilateral chronic knee pain 10/16/2015  . Severe obesity (BMI >= 40) ([place]) 10/16/2015  . Anemia 09/19/2015  . Vitamin D deficiency 09/19/2015  . Bilateral impacted cerumen 09/19/2015  . GAD (generalized anxiety disorder) 03/04/2014  . Hypokalemia 06/27/2013  . HTN (hypertension) 06/27/2013  . Sleep apnea 06/27/2013     Current Outpatient Medications on File Prior to Visit  Medication Sig Dispense Refill  . acetaminophen (TYLENOL) 500 MG tablet Take 500 mg  by mouth every 6 (six) hours as needed for mild pain.    Marland Kitchen albuterol (VENTOLIN HFA) 108 (90 Base) MCG/ACT inhaler Inhale 2 puffs into the lungs every 6 (six) hours as needed for wheezing or shortness of breath. 18 g 5  . benzonatate (TESSALON) 100 MG capsule Take 1 capsule (100 mg total) by mouth 3 (three) times daily as needed for cough. 30 capsule 0  . chlorthalidone (HYGROTON) 50 MG tablet Take 1 tablet (50 mg total) by mouth daily. 90 tablet 1  .  cholecalciferol (VITAMIN D3) 25 MCG (1000 UNIT) tablet Take 1,000 Units by mouth daily.    . diclofenac sodium (VOLTAREN) 1 % GEL Apply 2 g topically 4 (four) times daily. (Patient taking differently: Apply 2 g topically 4 (four) times daily as needed (pain).) 100 g 0  . dicyclomine (BENTYL) 10 MG capsule Take 1-2 capsules (10-20 mg total) by mouth every 8 (eight) hours as needed for spasms. 90 capsule 5  . diphenhydrAMINE-PSE-APAP (TYLENOL ALLERGY SINUS NIGHT PO) Take 1 tablet by mouth every 4 (four) hours as needed (headache).    . escitalopram (LEXAPRO) 20 MG tablet Take 1 tablet (20 mg total) by mouth daily. 90 tablet 1  . hydrOXYzine (ATARAX/VISTARIL) 25 MG tablet 1/2 to 1 tid prn anxiety (Patient taking differently: Take 12.5-25 mg by mouth every 8 (eight) hours as needed for anxiety. 1/2 to 1 tid prn anxiety) 60 tablet 1  . lisinopril (ZESTRIL) 40 MG tablet Take 1 tablet (40 mg total) by mouth daily. 90 tablet 1  . metoprolol tartrate (LOPRESSOR) 50 MG tablet TAKE 1 TABLET(50 MG) BY MOUTH TWICE DAILY (Patient taking differently: Take 50 mg by mouth 2 (two) times daily. TAKE 1 TABLET(50 MG) BY MOUTH TWICE DAILY) 180 tablet 1  . Multiple Vitamins-Minerals (MULTIVITAMIN PO) Take 1 tablet by mouth daily.    Marland Kitchen omeprazole (PRILOSEC) 20 MG capsule Take 1 capsule (20 mg total) by mouth daily. 90 capsule 3  . oxyCODONE-acetaminophen (PERCOCET/ROXICET) 5-325 MG tablet Take 1 tablet by mouth every 6 (six) hours as needed for moderate pain. 15 tablet 0  . potassium chloride SA (KLOR-CON) 20 MEQ tablet Take 1 tablet (20 mEq total) by mouth 2 (two) times daily. 60 tablet 3  . vitamin B-12 (CYANOCOBALAMIN) 1000 MCG tablet Take 1,000 mcg by mouth daily.    Marland Kitchen zinc gluconate 50 MG tablet Take 50 mg by mouth daily.     No current facility-administered medications on file prior to visit.    Allergies  Allergen Reactions  . Penicillins Shortness Of Breath and Rash    Social History   Socioeconomic History   . Marital status: Divorced    Spouse name: Not on file  . Number of children: 2  . Years of education: Not on file  . Highest education level: Not on file  Occupational History  . Occupation: truck Geophysicist/field seismologist  Tobacco Use  . Smoking status: Never Smoker  . Smokeless tobacco: Never Used  Substance and Sexual Activity  . Alcohol use: Yes    Alcohol/week: 1.0 - 2.0 standard drink    Types: 1 - 2 Glasses of wine per week    Comment: occoasional.  . Drug use: No  . Sexual activity: Yes  Other Topics Concern  . Not on file  Social History Narrative  . Not on file   Social Determinants of Health   Financial Resource Strain: Not on file  Food Insecurity: Not on file  Transportation Needs: Not on file  Physical Activity: Not on file  Stress: Not on file  Social Connections: Not on file  Intimate Partner Violence: Not on file    Family History  Problem Relation Age of Onset  . CAD Mother   . Diabetes Mellitus II Mother   . Stroke Mother   . Colon polyps Mother   . Crohn's disease Mother   . Allergy (severe) Sister   . Colon polyps Sister   . Allergy (severe) Son   . Prostate cancer Maternal Grandfather   . Diabetes Maternal Grandmother   . Hypertension Maternal Grandmother   . Heart attack Maternal Grandmother     Past Surgical History:  Procedure Laterality Date  . ABDOMINAL HYSTERECTOMY  2008   for  fibroids   . CESAREAN SECTION    . CHOLECYSTECTOMY    . THROMBECTOMY W/ EMBOLECTOMY Right 08/23/2020   Procedure: Right Upper Extremity Thrombectomy;  Surgeon: Marty Heck, MD;  Location: [facility] OR;  Service: Vascular;  Laterality: Right;    ROS: Review of Systems Negative except as stated above  PHYSICAL EXAM: BP 120/82   Pulse 70   Resp 16   Wt 285 lb 6.4 oz (129.5 kg)   SpO2 99%   BMI 47.49 kg/m   On 2 Liters Physical Exam Pulse ox on room air at rest 96% Pulse ox room air exertion 89 to 90%. Pulse ox on 1 L of oxygen resting 100% Pulse ox 1 L of O2  exertion 98 to 100%. General appearance - alert, well appearing, obese middle-aged African-American female and in no distress Mental status - normal mood, behavior, speech, dress, motor activity, and thought processes Chest - clear to auscultation, no wheezes, rales or rhonchi, symmetric air entry Heart - normal rate, regular rhythm, normal S1, S2, no murmurs, rubs, clicks or gallops Extremities -no lower extremity edema.  She has 3+ radial and brachial pulses.  Right and left arm are warm to touch.  No cyanosis noted.   CMP Latest Ref Rng & Units 08/30/2020 08/23/2020 08/23/2020  Glucose 70 - 99 mg/dL 102(H) 100(H) 101(H)  BUN 6 - 20 mg/dL 17 22(H) 19  Creatinine 0.44 - 1.00 mg/dL 0.78 0.80 0.90  Sodium 135 - 145 mmol/L 140 138 140  Potassium 3.5 - 5.1 mmol/L 3.8 4.6 4.0  Chloride 98 - 111 mmol/L 104 101 101  CO2 22 - 32 mmol/L 27 - 26  Calcium 8.9 - 10.3 mg/dL 9.3 - 8.9  Total Protein 6.5 - 8.1 g/dL - - -  Total Bilirubin 0.3 - 1.2 mg/dL - - -  Alkaline Phos 38 - 126 U/L - - -  AST 15 - 41 U/L - - -  ALT 0 - 44 U/L - - -   Lipid Panel     Component Value Date/Time   CHOL 191 02/02/2020 1616   TRIG 129 08/08/2020 1336   HDL 37 (L) 02/02/2020 1616   CHOLHDL 5.2 (H) 02/02/2020 1616   LDLCALC 129 (H) 02/02/2020 1616    CBC    Component Value Date/Time   WBC 8.0 08/30/2020 1028   RBC 4.43 08/30/2020 1028   HGB 9.8 (L) 08/30/2020 1028   HGB 11.4 02/02/2020 1616   HCT 32.3 (L) 08/30/2020 1028   HCT 37.2 02/02/2020 1616   PLT 309 08/30/2020 1028   PLT 413 02/02/2020 1616   MCV 72.9 (L) 08/30/2020 1028   MCV 73 (L) 02/02/2020 1616   MCH 22.1 (L) 08/30/2020 1028   MCHC 30.3 08/30/2020 1028  RDW 17.6 (H) 08/30/2020 1028   RDW 17.5 (H) 02/02/2020 1616   LYMPHSABS 2.1 08/23/2020 1254   LYMPHSABS 2.8 02/02/2020 1616   MONOABS 0.8 08/23/2020 1254   EOSABS 0.1 08/23/2020 1254   EOSABS 0.1 02/02/2020 1616   BASOSABS 0.0 08/23/2020 1254   BASOSABS 0.0 02/02/2020 1616     ASSESSMENT AND PLAN: 1. Acute respiratory failure with hypoxia (HCC) Clinically improved.   Advised use of oxygen only with ambulation.  Follow-up in 6 weeks for recheck of O2 saturation with ambulation.  2. 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 - apixaban (ELIQUIS) 5 MG TABS tablet; Take 1 tablet (5 mg total) by mouth 2 (two) times daily.  Dispense: 60 tablet; Refill: 3  3. Microcytic anemia Patient is postmenopausal and has had hysterectomy.  I have started her on iron and will check iron studies. - CBC - Iron, TIBC and Ferritin Panel - ferrous sulfate 325 (65 FE) MG tablet; Take 1 tablet (325 mg total) by mouth daily with breakfast.  Dispense: 100 tablet; Refill: 1  4. OSA (obstructive sleep apnea) - PSG Sleep Study; Future  5. Influenza vaccine refused   Patient was given the opportunity to ask questions.  Patient verbalized understanding of the plan and was able to repeat key elements of the plan.   Orders Placed This Encounter  Procedures  . CBC  . Iron, TIBC and Ferritin Panel  . PSG Sleep Study     Requested Prescriptions   Signed Prescriptions Disp Refills  . ferrous sulfate 325 (65 FE) MG tablet 100 tablet 1    Sig: Take 1 tablet (325 mg total) by mouth daily with breakfast.  . apixaban (ELIQUIS) 5 MG TABS tablet 60 tablet 3    Sig: Take 1 tablet (5 mg total) by mouth 2 (two) times daily.    Return in about 6 weeks (around 10/17/2020) for in person f/u in 6 wks.  Karle Plumber, MD, FACP

## 2020-09-05 NOTE — Patient Instructions (Signed)
Change oxygen to 1 liter with exertion.  You do not need to use the oxygen at rest. I have referred you for a sleep study. Please apply for the orange card/cone discount card.

## 2020-09-06 LAB — IRON,TIBC AND FERRITIN PANEL
Ferritin: 150 ng/mL (ref 15–150)
Iron Saturation: 19 % (ref 15–55)
Iron: 63 ug/dL (ref 27–159)
Total Iron Binding Capacity: 333 ug/dL (ref 250–450)
UIBC: 270 ug/dL (ref 131–425)

## 2020-09-06 LAB — CBC
Hematocrit: 35.8 % (ref 34.0–46.6)
Hemoglobin: 10.9 g/dL — ABNORMAL LOW (ref 11.1–15.9)
MCH: 22.1 pg — ABNORMAL LOW (ref 26.6–33.0)
MCHC: 30.4 g/dL — ABNORMAL LOW (ref 31.5–35.7)
MCV: 73 fL — ABNORMAL LOW (ref 79–97)
Platelets: 446 10*3/uL (ref 150–450)
RBC: 4.94 x10E6/uL (ref 3.77–5.28)
RDW: 17.3 % — ABNORMAL HIGH (ref 11.7–15.4)
WBC: 9.1 10*3/uL (ref 3.4–10.8)

## 2020-09-06 NOTE — Addendum Note (Signed)
Addended by: Karle Plumber B on: 09/06/2020 09:43 AM   Modules accepted: Orders, Level of Service

## 2020-09-08 LAB — SPECIMEN STATUS REPORT

## 2020-09-12 NOTE — Progress Notes (Deleted)
POST OPERATIVE OFFICE NOTE    CC:  F/u for surgery  HPI:  This is a 55 y.o. female who presented with right UE acute ischemia is s/p Right upper extremity thrombectomy  Pt returns today for follow up.  Pt states ***  Allergies  Allergen Reactions  . Penicillins Shortness Of Breath and Rash    Current Outpatient Medications  Medication Sig Dispense Refill  . acetaminophen (TYLENOL) 500 MG tablet Take 500 mg by mouth every 6 (six) hours as needed for mild pain.    Marland Kitchen albuterol (VENTOLIN HFA) 108 (90 Base) MCG/ACT inhaler Inhale 2 puffs into the lungs every 6 (six) hours as needed for wheezing or shortness of breath. 18 g 5  . apixaban (ELIQUIS) 5 MG TABS tablet Take 1 tablet (5 mg total) by mouth 2 (two) times daily. 60 tablet 3  . benzonatate (TESSALON) 100 MG capsule Take 1 capsule (100 mg total) by mouth 3 (three) times daily as needed for cough. 30 capsule 0  . chlorthalidone (HYGROTON) 50 MG tablet Take 1 tablet (50 mg total) by mouth daily. 90 tablet 1  . cholecalciferol (VITAMIN D3) 25 MCG (1000 UNIT) tablet Take 1,000 Units by mouth daily.    . diclofenac sodium (VOLTAREN) 1 % GEL Apply 2 g topically 4 (four) times daily. (Patient taking differently: Apply 2 g topically 4 (four) times daily as needed (pain).) 100 g 0  . dicyclomine (BENTYL) 10 MG capsule Take 1-2 capsules (10-20 mg total) by mouth every 8 (eight) hours as needed for spasms. 90 capsule 5  . diphenhydrAMINE-PSE-APAP (TYLENOL ALLERGY SINUS NIGHT PO) Take 1 tablet by mouth every 4 (four) hours as needed (headache).    . escitalopram (LEXAPRO) 20 MG tablet Take 1 tablet (20 mg total) by mouth daily. 90 tablet 1  . ferrous sulfate 325 (65 FE) MG tablet Take 1 tablet (325 mg total) by mouth daily with breakfast. 100 tablet 1  . hydrOXYzine (ATARAX/VISTARIL) 25 MG tablet 1/2 to 1 tid prn anxiety (Patient taking differently: Take 12.5-25 mg by mouth every 8 (eight) hours as needed for anxiety. 1/2 to 1 tid prn anxiety) 60  tablet 1  . lisinopril (ZESTRIL) 40 MG tablet Take 1 tablet (40 mg total) by mouth daily. 90 tablet 1  . metoprolol tartrate (LOPRESSOR) 50 MG tablet TAKE 1 TABLET(50 MG) BY MOUTH TWICE DAILY (Patient taking differently: Take 50 mg by mouth 2 (two) times daily. TAKE 1 TABLET(50 MG) BY MOUTH TWICE DAILY) 180 tablet 1  . Multiple Vitamins-Minerals (MULTIVITAMIN PO) Take 1 tablet by mouth daily.    Marland Kitchen omeprazole (PRILOSEC) 20 MG capsule Take 1 capsule (20 mg total) by mouth daily. 90 capsule 3  . oxyCODONE-acetaminophen (PERCOCET/ROXICET) 5-325 MG tablet Take 1 tablet by mouth every 6 (six) hours as needed for moderate pain. 15 tablet 0  . potassium chloride SA (KLOR-CON) 20 MEQ tablet Take 1 tablet (20 mEq total) by mouth 2 (two) times daily. 60 tablet 3  . vitamin B-12 (CYANOCOBALAMIN) 1000 MCG tablet Take 1,000 mcg by mouth daily.    Marland Kitchen zinc gluconate 50 MG tablet Take 50 mg by mouth daily.     No current facility-administered medications for this visit.     ROS:  See HPI  Physical Exam:    Incision:  *** Extremities:  *** Neuro: *** Abdomen:  ***   Assessment/Plan:  This is a 55 y.o. female who is s/p:right UE thrombectomy for acute right UE Ischemia.  Hospital stay included embolic  work-up.  CTA chest as well as echocardiogram were negative for embolic source.  Pharmacy and transition of care team arranged patient to transition to IV heparin to Eliquis at discharge which will be followed by the community wellness center.   -***   Roxy Horseman PA-C [facility] [phone]   Clinic MD:  ***

## 2020-09-13 ENCOUNTER — Ambulatory Visit (INDEPENDENT_AMBULATORY_CARE_PROVIDER_SITE_OTHER): Payer: Self-pay | Admitting: Physician Assistant

## 2020-09-13 ENCOUNTER — Other Ambulatory Visit: Payer: Self-pay

## 2020-09-13 VITALS — BP 114/78 | HR 75 | Temp 97.8°F | Resp 20 | Ht 65.0 in | Wt 289.3 lb

## 2020-09-13 DIAGNOSIS — I742 Embolism and thrombosis of arteries of the upper extremities: Secondary | ICD-10-CM

## 2020-09-13 NOTE — Progress Notes (Signed)
    Postoperative Visit   History of Present Illness   Theresa Pearson is a 55 y.o. year old female who presents for postoperative follow-up after thromboembolectomy of right upper extremity.  Was performed by Dr. Carlis Abbott on 08/23/2020 due to acute right upper extremity ischemia.  She has some numbness and pins-and-needles around her medial upper arm however this is tolerable.  She has complete motor and sensation of right hand.  She is on her Eliquis daily.  She states her incision  Is well healed.   Physical Examination   Vitals:   09/13/20 1407  BP: 114/78  Pulse: 75  Resp: 20  Temp: 97.8 F (36.6 C)  TempSrc: Temporal  SpO2: 98%  Weight: 289 lb 4.8 oz (131.2 kg)  Height: 5\' 5"  (1.651 m)   Body mass index is 48.14 kg/m.  right arm Incision is  healed, palpable radial pulse, hand grip is 5/5, sensation in digits is  intact    Medical Decision Making   Theresa Pearson is a 55 y.o. year old female who presents s/p right arm thromboembolectomy due to acute ischemia   Well perfused R hand with palpable R radial pulse  R axilla incision well healed  Continue Eliquis; she will need referral to Hematology if she ever considers coming off of anticoagulation  Follow up as needed   Dagoberto Ligas PA-C [facility] [place] Office: [phone]  Clinic MD: Scot Dock

## 2020-09-19 MED FILL — LISINOPRIL 40 MG TAB: 40 | 30 days supply | Qty: 30 | Fill #1

## 2020-09-19 MED FILL — ESCITALOPRAM 20 MG TABLET: 20 | 30 days supply | Qty: 30 | Fill #1

## 2020-09-19 MED FILL — CHLORTHALIDONE 50 MG TABLET: 50 | 30 days supply | Qty: 30 | Fill #1

## 2020-09-19 MED FILL — $ELIQUIS 5 MG TABLET: 5 | 30 days supply | Qty: 60 | Fill #0

## 2020-09-20 ENCOUNTER — Ambulatory Visit: Payer: Self-pay | Admitting: *Deleted

## 2020-09-20 MED ORDER — PREDNISONE 20 MG PO TABS: ORAL_TABLET | ORAL | 0 refills | Status: DC

## 2020-09-20 NOTE — Telephone Encounter (Signed)
Contacted pt to go over Dr. Wynetta Emery message pt is aware and will pick up rx.   Mallory would you contact pt and schedule an appt with any available provider

## 2020-09-20 NOTE — Addendum Note (Signed)
Addended by: Karle Plumber B on: 09/20/2020 05:03 PM   Modules accepted: Orders

## 2020-09-20 NOTE — Telephone Encounter (Signed)
I returned pt's call.   She is c/o having pain, redness and swelling in the joint of her right big toe for 2 days now.   The pain woke her up 2 nights ago.   "I can't even let the sheet touch my toe it's so sensitive".   She denies history of gout but she thinks that may be what it is.  There were no appointments available at [facility].  Earliest appt available was the middle of April with any provider.  I encouraged her to go to an urgent care.   She really did not want to go to an urgent care.    I told her about the mobile unit being located at the [facility] on [address] today but she would have to arrive no later than 4:00 and it's first come, first served so no guarantee she could be seen today.   "I can't make it by then".   "I have to get an Melburn Popper".  I let her know the urgent care would be the best option at this point.  She thanked me very much for my help.   I forwarded my notes to [facility] and [facility] for their information.   Maybe they can work her in.    Reason for Disposition . Pain in the big toe joint  Answer Assessment - Initial Assessment Questions 1. ONSET: "When did the pain start?"      Having pain in right big toe. 2. LOCATION: "Where is the pain located?"   (e.g., around nail, entire toe, at foot joint)      Right big toe   Started hurting 2 days ago.   Woke me up during the night 2 nights ago hurting.   It's gotten worse.    The joint is swollen and red. Denies gout.    3. PAIN: "How bad is the pain?"    (Scale 1-10; or mild, moderate, severe)   -  MILD (1-3): doesn't interfere with normal activities    -  MODERATE (4-7): interferes with normal activities (e.g., work or school) or awakens from sleep, limping    -  SEVERE (8-10): excruciating pain, unable to do any normal activities, unable to walk     7-8 when bad.   It's a constant 4.   I can't put on a shoe. 4. APPEARANCE: "What does the toe look like?" (e.g., redness,  swelling, bruising, pallor)     Big toe joint is red and swollen 5. CAUSE: "What do you think is causing the toe pain?"     Maybe gout? 6. OTHER SYMPTOMS: "Do you have any other symptoms?" (e.g., leg pain, rash, fever, numbness)     Just in my toe.   7. PREGNANCY: "Is there any chance you are pregnant?" "When was your last menstrual period?"     Not asked  Protocols used: TOE PAIN-A-AH

## 2020-09-21 ENCOUNTER — Other Ambulatory Visit: Payer: Self-pay | Admitting: Internal Medicine

## 2020-09-21 ENCOUNTER — Other Ambulatory Visit: Payer: Self-pay

## 2020-09-21 MED ORDER — PREDNISONE 20 MG PO TABS: ORAL_TABLET | ORAL | 0 refills | Status: DC

## 2020-09-21 MED FILL — predniSONE 20 MG TABS: 20 | 6 days supply | Qty: 9 | Fill #0

## 2020-10-26 ENCOUNTER — Other Ambulatory Visit: Payer: Self-pay

## 2020-10-26 ENCOUNTER — Other Ambulatory Visit: Payer: Self-pay | Admitting: Internal Medicine

## 2020-10-26 ENCOUNTER — Inpatient Hospital Stay: Payer: Self-pay | Admitting: Internal Medicine

## 2020-10-26 DIAGNOSIS — I742 Embolism and thrombosis of arteries of the upper extremities: Secondary | ICD-10-CM

## 2020-10-26 MED FILL — Apixaban Tab 5 MG: ORAL | 30 days supply | Qty: 60 | Fill #0 | Status: AC

## 2020-10-30 ENCOUNTER — Other Ambulatory Visit: Payer: Self-pay

## 2020-10-30 MED FILL — Metoprolol Tartrate Tab 50 MG: ORAL | 30 days supply | Qty: 60 | Fill #0 | Status: AC

## 2020-10-30 MED FILL — Potassium Chloride Microencapsulated Crys ER Tab 20 mEq: ORAL | 30 days supply | Qty: 60 | Fill #0 | Status: AC

## 2020-10-30 MED FILL — Escitalopram Oxalate Tab 20 MG (Base Equiv): ORAL | 30 days supply | Qty: 30 | Fill #0 | Status: AC

## 2020-10-30 MED FILL — Chlorthalidone Tab 50 MG: ORAL | 30 days supply | Qty: 30 | Fill #0 | Status: AC

## 2020-10-30 MED FILL — Lisinopril Tab 40 MG: ORAL | 30 days supply | Qty: 30 | Fill #0 | Status: AC

## 2020-10-31 ENCOUNTER — Other Ambulatory Visit: Payer: Self-pay

## 2020-11-01 ENCOUNTER — Other Ambulatory Visit: Payer: Self-pay

## 2020-11-08 ENCOUNTER — Other Ambulatory Visit: Payer: Self-pay

## 2020-11-14 ENCOUNTER — Other Ambulatory Visit: Payer: Self-pay

## 2020-11-14 ENCOUNTER — Emergency Department (HOSPITAL_BASED_OUTPATIENT_CLINIC_OR_DEPARTMENT_OTHER): Payer: Medicaid Other

## 2020-11-14 ENCOUNTER — Emergency Department (HOSPITAL_BASED_OUTPATIENT_CLINIC_OR_DEPARTMENT_OTHER)
Admission: EM | Admit: 2020-11-14 | Discharge: 2020-11-15 | Disposition: A | Discharge status: Home / Self Care | Payer: Medicaid Other | Attending: Emergency Medicine | Admitting: Emergency Medicine

## 2020-11-14 ENCOUNTER — Encounter (HOSPITAL_BASED_OUTPATIENT_CLINIC_OR_DEPARTMENT_OTHER): Payer: Self-pay

## 2020-11-14 DIAGNOSIS — Z8616 Personal history of COVID-19: Secondary | ICD-10-CM | POA: Insufficient documentation

## 2020-11-14 DIAGNOSIS — J45909 Unspecified asthma, uncomplicated: Secondary | ICD-10-CM | POA: Diagnosis not present

## 2020-11-14 DIAGNOSIS — Z7901 Long term (current) use of anticoagulants: Secondary | ICD-10-CM | POA: Insufficient documentation

## 2020-11-14 DIAGNOSIS — W06XXXA Fall from bed, initial encounter: Secondary | ICD-10-CM | POA: Diagnosis not present

## 2020-11-14 DIAGNOSIS — I10 Essential (primary) hypertension: Secondary | ICD-10-CM | POA: Insufficient documentation

## 2020-11-14 DIAGNOSIS — Z79899 Other long term (current) drug therapy: Secondary | ICD-10-CM | POA: Insufficient documentation

## 2020-11-14 DIAGNOSIS — M25572 Pain in left ankle and joints of left foot: Secondary | ICD-10-CM | POA: Insufficient documentation

## 2020-11-14 MED ORDER — HYDROCODONE-ACETAMINOPHEN 5-325 MG PO TABS
1.0000 | ORAL_TABLET | Freq: Once | ORAL | Status: AC
  Administered 2020-11-14: 1 via ORAL
  Filled 2020-11-14: qty 1

## 2020-11-14 NOTE — ED Provider Notes (Addendum)
[facility] EMERGENCY DEPT Provider Note   CSN: [id number] Arrival date & time: 11/14/20  2249     History Chief Complaint  Patient presents with  . Ankle Pain    Left    Theresa Pearson is a 55 y.o. female.  HPI     This 55 year old female with a history of chronic respiratory failure on home O2, fibromyalgia, hypertension who presents with left ankle pain.  Patient reports that she got up this morning and stepped out of bed.  She had sharp shooting pains to the lateral aspect of her ankle.  She states that progressively throughout the day the pain has worsened.  She has taken Tylenol with minimal relief.  She states that she is walking with a limp.  She denies any injury.  She does not believe that she rolled her ankle getting out of bed.  She does report prior injury with surgery to that ankle.  No fevers or redness noted.  She rates her pain at 10 out of 10.  Denies numbness or tingling.  Past Medical History:  Diagnosis Date  . Anemia   . Anxiety   . Arthritis   . Asthma   . Chronic headaches   . Depression   . Diverticulosis   . Fibromyalgia   . Gallstones   . GERD (gastroesophageal reflux disease)   . Hypertension   . Pneumonia   . Right leg DVT ([place])   . Sleep apnea     Patient Active Problem List   Diagnosis Date Noted  . Influenza vaccine refused 09/05/2020  . History of COVID-19 08/28/2020  . 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 Acute respiratory failure with hypoxia ([place]) 08/14/2020  . Multifocal pneumonia 08/08/2020  . Prolonged QT interval 08/08/2020  . Epigastric pain 06/24/2016  . Bilateral chronic knee pain 10/16/2015  . Severe obesity (BMI >= 40) ([place]) 10/16/2015  . Anemia 09/19/2015  . Vitamin D deficiency 09/19/2015  . Bilateral impacted cerumen 09/19/2015  . GAD (generalized anxiety disorder) 03/04/2014  . Hypokalemia 06/27/2013  . HTN (hypertension) 06/27/2013  . Sleep apnea 06/27/2013    Past Surgical History:   Procedure Laterality Date  . ABDOMINAL HYSTERECTOMY  2008   for  fibroids   . CESAREAN SECTION    . CHOLECYSTECTOMY    . THROMBECTOMY W/ EMBOLECTOMY Right 08/23/2020   Procedure: Right Upper Extremity Thrombectomy;  Surgeon: Marty Heck, MD;  Location: [facility] OR;  Service: Vascular;  Laterality: Right;     OB History   No obstetric history on file.     Family History  Problem Relation Age of Onset  . CAD Mother   . Diabetes Mellitus II Mother   . Stroke Mother   . Colon polyps Mother   . Crohn's disease Mother   . Allergy (severe) Sister   . Colon polyps Sister   . Allergy (severe) Son   . Prostate cancer Maternal Grandfather   . Diabetes Maternal Grandmother   . Hypertension Maternal Grandmother   . Heart attack Maternal Grandmother     Social History   Tobacco Use  . Smoking status: Never Smoker  . Smokeless tobacco: Never Used  Substance Use Topics  . Alcohol use: Yes    Alcohol/week: 1.0 - 2.0 standard drink    Types: 1 - 2 Glasses of wine per week    Comment: occoasional.  . Drug use: No    Home Medications Prior to Admission medications   Medication Sig Start Date  End Date Taking? Authorizing Provider  acetaminophen (TYLENOL) 500 MG tablet Take 500 mg by mouth every 6 (six) hours as needed for mild pain.    [provider]  albuterol (VENTOLIN HFA) 108 (90 Base) MCG/ACT inhaler INHALE 2 PUFFS INTO THE LUNGS EVERY 6 (SIX) HOURS AS NEEDED FOR WHEEZING OR SHORTNESS OF BREATH. 08/14/20 08/14/21  Ladell Pier, MD  apixaban (ELIQUIS) 5 MG TABS tablet Take 1 tablet (5 mg total) by mouth 2 (two) times daily. 09/05/20   Ladell Pier, MD  apixaban (ELIQUIS) 5 MG TABS tablet TAKE 1 TABLET (5 MG TOTAL) BY MOUTH 2 (TWO) TIMES DAILY. 08/25/20 08/25/21  Dagoberto Ligas, PA-C  apixaban (ELIQUIS) 5 MG TABS tablet TAKE 2 TABLETS (10 MG TOTAL) BY MOUTH TWO TIMES DAILY FOR 7 DAYS. THEN TAKE 1 TABLET BY MOUTH TWICE DAILY. 08/25/20 08/25/21  Dagoberto Ligas,  PA-C  benzonatate (TESSALON) 100 MG capsule TAKE 1 CAPSULE (100 MG TOTAL) BY MOUTH 3 (THREE) TIMES DAILY AS NEEDED FOR COUGH. 08/14/20 08/14/21  Ladell Pier, MD  chlorthalidone (HYGROTON) 50 MG tablet TAKE 1 TABLET (50 MG TOTAL) BY MOUTH DAILY. 08/14/20 08/14/21  Ladell Pier, MD  cholecalciferol (VITAMIN D3) 25 MCG (1000 UNIT) tablet Take 1,000 Units by mouth daily.    [provider]  dexamethasone (DECADRON) 4 MG tablet TAKE 1 & 1/2 TABLETS (6 MG) BY MOUTH DAILY FOR 7 DAYS. 08/10/20 08/10/21  Dessa Phi, DO  diclofenac sodium (VOLTAREN) 1 % GEL Apply 2 g topically 4 (four) times daily. Patient taking differently: Apply 2 g topically 4 (four) times daily as needed (pain). 04/21/17   Thurman Coyer, DO  dicyclomine (BENTYL) 10 MG capsule Take 1-2 capsules (10-20 mg total) by mouth every 8 (eight) hours as needed for spasms. 03/12/19   Fulp, Cammie, MD  diphenhydrAMINE-PSE-APAP (TYLENOL ALLERGY SINUS NIGHT PO) Take 1 tablet by mouth every 4 (four) hours as needed (headache).    [provider]  doxycycline (VIBRA-TABS) 100 MG tablet TAKE 1 TABLET BY MOUTH EVERY 12 HOURS FOR 2 DAYS. 08/10/20 08/10/21  Dessa Phi, DO  escitalopram (LEXAPRO) 20 MG tablet TAKE 1 TABLET (20 MG TOTAL) BY MOUTH DAILY. 08/14/20 08/14/21  Ladell Pier, MD  ferrous sulfate 325 (65 FE) MG tablet TAKE 1 TABLET (325 MG TOTAL) BY MOUTH DAILY WITH BREAKFAST. 09/05/20 09/05/21  Ladell Pier, MD  hydrOXYzine (ATARAX/VISTARIL) 25 MG tablet TAKE 1/2 TO 1 TABLET THREE TIMES DAILY AS NEEDED FOR ANXIETY Patient taking differently: Take 12.5-25 mg by mouth every 8 (eight) hours as needed for anxiety. 1/2 to 1 tid prn anxiety 02/02/20 02/01/21  Argentina Donovan, PA-C  lisinopril (ZESTRIL) 40 MG tablet TAKE 1 TABLET (40 MG TOTAL) BY MOUTH DAILY. 08/14/20 08/14/21  Ladell Pier, MD  metoprolol tartrate (LOPRESSOR) 50 MG tablet TAKE 1 TABLET(50 MG) BY MOUTH TWICE DAILY Patient taking differently: Take  50 mg by mouth 2 (two) times daily. TAKE 1 TABLET(50 MG) BY MOUTH TWICE DAILY 08/14/20 08/14/21  Ladell Pier, MD  Multiple Vitamins-Minerals (MULTIVITAMIN PO) Take 1 tablet by mouth daily.    [provider]  omeprazole (PRILOSEC) 20 MG capsule Take 1 capsule (20 mg total) by mouth daily. 02/02/20   Argentina Donovan, PA-C  oxyCODONE-acetaminophen (PERCOCET/ROXICET) 5-325 MG tablet Take 1 tablet by mouth every 6 (six) hours as needed for moderate pain. Patient not taking: Reported on 09/13/2020 08/25/20   Dagoberto Ligas, PA-C  oxyCODONE-acetaminophen (PERCOCET/ROXICET) 5-325 MG tablet TAKE 1  TABLET BY MOUTH EVERY SIX HOURS AS NEEDED FOR MODERATE PAIN. 08/25/20 02/21/21  Dagoberto Ligas, PA-C  potassium chloride SA (KLOR-CON) 20 MEQ tablet TAKE 1 TABLET (20 MEQ TOTAL) BY MOUTH 2 (TWO) TIMES DAILY. 02/02/20 02/01/21  Argentina Donovan, PA-C  predniSONE (DELTASONE) 20 MG tablet TAKE 2 TABLETS BY MOUTH DAILY FOR 2 DAYS THEN 1.5 TAB DAILY FOR 2 DAYS THEN 1 TAB DAILY FOR 2 DAYS 09/21/20 09/21/21  Ladell Pier, MD  vitamin B-12 (CYANOCOBALAMIN) 1000 MCG tablet Take 1,000 mcg by mouth daily.    [provider]  zinc gluconate 50 MG tablet Take 50 mg by mouth daily.    [provider]    Allergies    Penicillins  Review of Systems   Review of Systems  Constitutional: Negative for fever.  Musculoskeletal:       Ankle pain  Skin: Negative for color change.  All other systems reviewed and are negative.   Physical Exam Updated Vital Signs BP 133/82 (BP Location: Right Arm)   Pulse 81   Temp 98.3 F (36.8 C) (Oral)   Resp 16   Ht 1.651 m (5\' 5" )   Wt 129.3 kg   SpO2 100%   BMI 47.43 kg/m   Physical Exam Vitals and nursing note reviewed.  Constitutional:      Appearance: She is well-developed. She is obese.     Comments: Chronically ill-appearing but nontoxic  HENT:     Head: Normocephalic and atraumatic.     Mouth/Throat:     Mouth: Mucous membranes are  moist.  Cardiovascular:     Rate and Rhythm: Normal rate and regular rhythm.  Pulmonary:     Effort: Pulmonary effort is normal. No respiratory distress.     Comments: Nasal cannula in place Musculoskeletal:     Cervical back: Neck supple.     Comments: Focused examination of the left ankle revealed reveals no obvious deformity, there is tenderness palpation over the lateral malleolus, no significant effusion, no overlying skin changes, decreased range of motion secondary to pain, 2+ DP pulse  Skin:    General: Skin is warm and dry.  Neurological:     Mental Status: She is alert and oriented to person, place, and time.  Psychiatric:        Mood and Affect: Mood normal.     ED Results / Procedures / Treatments   Labs (all labs ordered are listed, but only abnormal results are displayed) Labs Reviewed - No data to display  EKG None  Radiology DG Ankle Complete Left  Result Date: 11/14/2020 CLINICAL DATA:  Left ankle pain EXAM: LEFT ANKLE COMPLETE - 3+ VIEW COMPARISON:  None. FINDINGS: Three view radiograph left ankle demonstrates normal alignment. No fracture or dislocation. Tiny accessory ossicle subjacent to the medial malleolus. Normal alignment of the ankle mortise. No effusion. Tiny plantar calcaneal spur. Bimalleolar soft tissue swelling, medial greater than lateral IMPRESSION: Bimalleolar soft tissue swelling.  No acute fracture or dislocation. Electronically Signed   By: Fidela Salisbury MD   On: 11/14/2020 23:49    Procedures Procedures   Medications Ordered in ED Medications  HYDROcodone-acetaminophen (NORCO/VICODIN) 5-325 MG per tablet 1 tablet (1 tablet Oral Given 11/14/20 2336)    ED Course  I have reviewed the triage vital signs and the nursing notes.  Pertinent labs & imaging results that were available during my care of the patient were reviewed by me and considered in my medical decision making (see chart for details).  MDM Rules/Calculators/A&P                           Patient presents with left ankle pain.  Nontoxic-appearing and vital signs are reassuring.  She describes pain upon getting out of bed this morning.  Worsening throughout the day with ambulation.  No obvious deformities on exam.  No overlying skin changes.  There is pain with range of motion.  She appears well perfused with good pulses.  Considerations include but not limited to, fracture, sprain, inflammatory arthritis.  Less favored septic arthritis as patient is afebrile and there are no overlying skin changes.  She has good pulses and do not feel this is consistent with ischemia or claudication even the patient does have a history of embolism to one of her upper extremities.  X-ray showed no evidence of fracture.  She does have some soft tissue swelling.  Question whether she may have injured it without recognizing it.  Patient reassured.  Recommend ice and elevation.  Tylenol as needed for pain.  Will provide with crutches for ambulatory support.  After history, exam, and medical workup I feel the patient has been appropriately medically screened and is safe for discharge home. Pertinent diagnoses were discussed with the patient. Patient was given return precautions.  Final Clinical Impression(s) / ED Diagnoses Final diagnoses:  Acute left ankle pain    Rx / DC Orders ED Discharge Orders    None       Rydell Wiegel, Barbette Hair, MD 11/15/20 0001    Dina Rich Barbette Hair, MD 11/15/20 0003

## 2020-11-14 NOTE — ED Triage Notes (Signed)
Patient BIB [facility] from Home with L. Ankle Pain.   Pain began this morning when patient got OOB. The patient did not twist or roll ankle but as soon as she stood up she felt a sharp pain.  Patient chronically on Supplemental Oxygen 2-3L [place].  GCS 15. Normally ambulatory.

## 2020-11-15 NOTE — Discharge Instructions (Addendum)
You were seen today for ankle pain.  Your x-ray shows some soft tissue swelling but no fractures or breaks.  Keep iced and elevated.  Take Tylenol as needed for pain.  You may use crutches if this helps.  Follow-up with your primary physician if not improving.  If you develop fevers, redness of the ankle, any new or worsening symptoms, you should be reevaluated.

## 2020-11-15 NOTE — ED Notes (Signed)
Called [facility] for transport of pt going home; dispatch stated there was about 8 calls ahead of pt and it would be sometime tomorrow before they could transport her. Informed pt of such she stated that's fine because she has no other way home. Pt was given food and drink at this time.

## 2020-11-16 ENCOUNTER — Encounter: Payer: Self-pay | Admitting: Physician Assistant

## 2020-11-16 ENCOUNTER — Telehealth: Payer: Self-pay | Admitting: Physician Assistant

## 2020-11-16 DIAGNOSIS — M25572 Pain in left ankle and joints of left foot: Secondary | ICD-10-CM

## 2020-11-16 NOTE — Progress Notes (Signed)
[name], Pearson are scheduled for a virtual visit with your provider today.    Just as we do with appointments in the office, we must obtain your consent to participate.  Your consent will be active for this visit and any virtual visit you may have with one of our providers in the next 365 days.    If you have a MyChart account, I can also send a copy of this consent to you electronically.  All virtual visits are billed to your insurance company just like a traditional visit in the office.  As this is a virtual visit, video technology does not allow for your provider to perform a traditional examination.  This may limit your provider's ability to fully assess your condition.  If your provider identifies any concerns that need to be evaluated in person or the need to arrange testing such as labs, EKG, etc, we will make arrangements to do so.    Although advances in technology are sophisticated, we cannot ensure that it will always work on either your end or our end.  If the connection with a video visit is poor, we may have to switch to a telephone visit.  With either a video or telephone visit, we are not always able to ensure that we have a secure connection.   I need to obtain your verbal consent now.   Are you willing to proceed with your visit today?   [name] has provided verbal consent on 11/16/2020 for a virtual visit (video or telephone).   Jarrett Soho Fredricka Kohrs, PA-C 11/16/2020  5:29 PM   Date:  11/16/2020   ID:  Theresa Pearson, DOB 1966/04/07, MRN [id number]  Patient Location: Home Provider Location: Home Office   Participants: Patient and Provider for Visit and Wrap up  Method of visit: Video  Location of Patient: Home Location of Provider: Home Office Consent was obtain for visit over the video. Services rendered by provider: Visit was performed via video  A video enabled telemedicine application was used and I verified that I am speaking with the correct person using two  identifiers.  PCP:  Ladell Pier, MD   Chief Complaint:  Left ankle pain  History of Present Illness:    Theresa Pearson is a 55 y.o. female with history as stated below. Presents video telehealth for an acute care visit  Onset of symptoms was 5/3 and symptoms have been persistent and include: left Ankle pain.   Pt was seen in the ER.  I personally reviewed the ER documentation and x-ray - no acute fracture.   Denies having fevers, chills, shortness of breath, cough, chest pain, ear pain, sore throat or exposure to covid or other sick contacts. Pt denies falls or known injury.  No increased swelling from ED visit.  Denies redness, overlying wound or redness.  Does report pain is increasing.  Pt describes pain as throbbing when sitting still but pain becomes sharp when moving.   Modifying factors include: Tylenol and "anxiety medications;" patient cannot take ibuprofen due to stomach problems.  No other aggravating or relieving factors.  No other c/o.  Pt has a podiatrist - [facility].    The patient does not have symptoms concerning for COVID-19 infection (fever, chills, cough, or new shortness of breath).  Patient has not been tested for COVID during this illness.  Past Medical, Surgical, Social History, Allergies, and Medications have been Reviewed.  Patient Active Problem List   Diagnosis Date Noted  . Influenza vaccine  refused 09/05/2020  . History of COVID-19 08/28/2020  . 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 Acute respiratory failure with hypoxia ([place]) 08/14/2020  . Multifocal pneumonia 08/08/2020  . Prolonged QT interval 08/08/2020  . Epigastric pain 06/24/2016  . Bilateral chronic knee pain 10/16/2015  . Severe obesity (BMI >= 40) ([place]) 10/16/2015  . Anemia 09/19/2015  . Vitamin D deficiency 09/19/2015  . Bilateral impacted cerumen 09/19/2015  . GAD (generalized anxiety disorder) 03/04/2014  . Hypokalemia 06/27/2013  . HTN (hypertension)  06/27/2013  . Sleep apnea 06/27/2013    Social History   Tobacco Use  . Smoking status: Never Smoker  . Smokeless tobacco: Never Used  Substance Use Topics  . Alcohol use: Yes    Alcohol/week: 1.0 - 2.0 standard drink    Types: 1 - 2 Glasses of wine per week    Comment: occoasional.     Current Outpatient Medications:  .  acetaminophen (TYLENOL) 500 MG tablet, Take 500 mg by mouth every 6 (six) hours as needed for mild pain., Disp: , Rfl:  .  albuterol (VENTOLIN HFA) 108 (90 Base) MCG/ACT inhaler, INHALE 2 PUFFS INTO THE LUNGS EVERY 6 (SIX) HOURS AS NEEDED FOR WHEEZING OR SHORTNESS OF BREATH., Disp: 18 g, Rfl: 5 .  apixaban (ELIQUIS) 5 MG TABS tablet, Take 1 tablet (5 mg total) by mouth 2 (two) times daily., Disp: 60 tablet, Rfl: 3 .  apixaban (ELIQUIS) 5 MG TABS tablet, TAKE 1 TABLET (5 MG TOTAL) BY MOUTH 2 (TWO) TIMES DAILY., Disp: 60 tablet, Rfl: 1 .  apixaban (ELIQUIS) 5 MG TABS tablet, TAKE 2 TABLETS (10 MG TOTAL) BY MOUTH TWO TIMES DAILY FOR 7 DAYS. THEN TAKE 1 TABLET BY MOUTH TWICE DAILY., Disp: 74 tablet, Rfl: 0 .  benzonatate (TESSALON) 100 MG capsule, TAKE 1 CAPSULE (100 MG TOTAL) BY MOUTH 3 (THREE) TIMES DAILY AS NEEDED FOR COUGH., Disp: 30 capsule, Rfl: 0 .  chlorthalidone (HYGROTON) 50 MG tablet, TAKE 1 TABLET (50 MG TOTAL) BY MOUTH DAILY., Disp: 90 tablet, Rfl: 1 .  cholecalciferol (VITAMIN D3) 25 MCG (1000 UNIT) tablet, Take 1,000 Units by mouth daily., Disp: , Rfl:  .  dexamethasone (DECADRON) 4 MG tablet, TAKE 1 & 1/2 TABLETS (6 MG) BY MOUTH DAILY FOR 7 DAYS., Disp: 11 tablet, Rfl: 0 .  diclofenac sodium (VOLTAREN) 1 % GEL, Apply 2 g topically 4 (four) times daily. (Patient taking differently: Apply 2 g topically 4 (four) times daily as needed (pain).), Disp: 100 g, Rfl: 0 .  dicyclomine (BENTYL) 10 MG capsule, Take 1-2 capsules (10-20 mg total) by mouth every 8 (eight) hours as needed for spasms., Disp: 90 capsule, Rfl: 5 .  diphenhydrAMINE-PSE-APAP (TYLENOL ALLERGY  SINUS NIGHT PO), Take 1 tablet by mouth every 4 (four) hours as needed (headache)., Disp: , Rfl:  .  doxycycline (VIBRA-TABS) 100 MG tablet, TAKE 1 TABLET BY MOUTH EVERY 12 HOURS FOR 2 DAYS., Disp: 4 tablet, Rfl: 0 .  escitalopram (LEXAPRO) 20 MG tablet, TAKE 1 TABLET (20 MG TOTAL) BY MOUTH DAILY., Disp: 90 tablet, Rfl: 1 .  ferrous sulfate 325 (65 FE) MG tablet, TAKE 1 TABLET (325 MG TOTAL) BY MOUTH DAILY WITH BREAKFAST., Disp: 100 tablet, Rfl: 1 .  hydrOXYzine (ATARAX/VISTARIL) 25 MG tablet, TAKE 1/2 TO 1 TABLET THREE TIMES DAILY AS NEEDED FOR ANXIETY (Patient taking differently: Take 12.5-25 mg by mouth every 8 (eight) hours as needed for anxiety. 1/2 to 1 tid prn anxiety), Disp: 60 tablet, Rfl: 1 .  lisinopril (ZESTRIL) 40 MG tablet, TAKE 1 TABLET (40 MG TOTAL) BY MOUTH DAILY., Disp: 90 tablet, Rfl: 1 .  metoprolol tartrate (LOPRESSOR) 50 MG tablet, TAKE 1 TABLET(50 MG) BY MOUTH TWICE DAILY (Patient taking differently: Take 50 mg by mouth 2 (two) times daily. TAKE 1 TABLET(50 MG) BY MOUTH TWICE DAILY), Disp: 180 tablet, Rfl: 1 .  Multiple Vitamins-Minerals (MULTIVITAMIN PO), Take 1 tablet by mouth daily., Disp: , Rfl:  .  omeprazole (PRILOSEC) 20 MG capsule, Take 1 capsule (20 mg total) by mouth daily., Disp: 90 capsule, Rfl: 3 .  oxyCODONE-acetaminophen (PERCOCET/ROXICET) 5-325 MG tablet, Take 1 tablet by mouth every 6 (six) hours as needed for moderate pain. (Patient not taking: Reported on 09/13/2020), Disp: 15 tablet, Rfl: 0 .  oxyCODONE-acetaminophen (PERCOCET/ROXICET) 5-325 MG tablet, TAKE 1 TABLET BY MOUTH EVERY SIX HOURS AS NEEDED FOR MODERATE PAIN., Disp: 15 tablet, Rfl: 0 .  potassium chloride SA (KLOR-CON) 20 MEQ tablet, TAKE 1 TABLET (20 MEQ TOTAL) BY MOUTH 2 (TWO) TIMES DAILY., Disp: 60 tablet, Rfl: 3 .  predniSONE (DELTASONE) 20 MG tablet, TAKE 2 TABLETS BY MOUTH DAILY FOR 2 DAYS THEN 1.5 TAB DAILY FOR 2 DAYS THEN 1 TAB DAILY FOR 2 DAYS, Disp: 9 tablet, Rfl: 0 .  vitamin B-12  (CYANOCOBALAMIN) 1000 MCG tablet, Take 1,000 mcg by mouth daily., Disp: , Rfl:  .  zinc gluconate 50 MG tablet, Take 50 mg by mouth daily., Disp: , Rfl:    Allergies  Allergen Reactions  . Penicillins Shortness Of Breath and Rash     Review of Systems  Constitutional: Negative for chills and fever.  HENT: Negative for congestion, ear pain and sore throat.   Eyes: Negative for blurred vision and double vision.  Respiratory: Negative for cough, shortness of breath and wheezing.   Cardiovascular: Negative for chest pain, palpitations and leg swelling.  Gastrointestinal: Negative for abdominal pain, diarrhea, nausea and vomiting.  Genitourinary: Negative for dysuria.  Musculoskeletal: Positive for joint pain. Negative for myalgias.  Skin: Negative for rash.  Neurological: Negative for loss of consciousness, weakness and headaches.  Psychiatric/Behavioral: The patient is not nervous/anxious.    See HPI for history of present illness.  Physical Exam Constitutional:      General: She is not in acute distress.    Appearance: Normal appearance. She is not ill-appearing.     Comments: Chronically ill appearing; nontoxic  HENT:     Head: Normocephalic and atraumatic.     Nose: No congestion.  Eyes:     Extraocular Movements: Extraocular movements intact.  Pulmonary:     Effort: Pulmonary effort is normal.     Comments: Oxygen in place Musculoskeletal:     Cervical back: Normal range of motion.     Comments: Diminished ROM of the left ankle  Skin:    Coloration: Skin is not pale.  Neurological:     General: No focal deficit present.     Mental Status: She is alert. Mental status is at baseline.  Psychiatric:        Mood and Affect: Mood normal.               A&P  1. Acute left ankle pain  - left ankle pain - worsening since ED visit on 5/3.  No change in swelling, skin.  Pt requesting Tylenol #3.  - as this is a video visit, I cannot prescribe Tylenol #3.  I have requested  that patient contact her sports medicine physician to see  what they might recommend for additional pain control - encouraged continued rest, ice, elevation and Tylenol usage.   Patient voiced understanding and agreement to plan.   Time:   Today, I have spent 15 minutes with the patient with telehealth technology discussing the above problems, reviewing the chart, previous notes, medications and orders.    Tests Ordered: No orders of the defined types were placed in this encounter.   Medication Changes: No orders of the defined types were placed in this encounter.    Disposition:  Follow up sports medicine tomorrow morning.  Celso Sickle, PA-C  11/16/2020 5:39 PM

## 2020-11-16 NOTE — Patient Instructions (Addendum)
1. Acute left ankle pain  - left ankle pain - worsening since ED visit on 5/3.  No change in swelling, skin.  Pt requesting Tylenol #3.  - as this is a video visit, I cannot prescribe Tylenol #3.  I have requested that patient contact her sports medicine physician to see what they might recommend for additional pain control - encouraged continued rest, ice, elevation and Tylenol usage.  Ankle Pain The ankle joint holds your body weight and allows you to move around. Ankle pain can occur on either side or the back of one ankle or both ankles. Ankle pain may be sharp and burning or dull and aching. There may be tenderness, stiffness, redness, or warmth around the ankle. Many things can cause ankle pain, including an injury to the area and overuse of the ankle. Follow these instructions at home: Activity  Rest your ankle as told by your health care provider. Avoid any activities that cause ankle pain.  Do not use the injured limb to support your body weight until your health care provider says that you can. Use crutches as told by your health care provider.  Do exercises as told by your health care provider.  Ask your health care provider when it is safe to drive if you have a brace on your ankle. If you have a brace:  Wear the brace as told by your health care provider. Remove it only as told by your health care provider.  Loosen the brace if your toes tingle, become numb, or turn cold and blue.  Keep the brace clean.  If the brace is not waterproof: ? Do not let it get wet. ? Cover it with a watertight covering when you take a bath or shower. If you were given an elastic bandage:  Remove it when you take a bath or a shower.  Try not to move your ankle very much, but wiggle your toes from time to time. This helps to prevent swelling.  Adjust the bandage to make it more comfortable if it feels too tight.  Loosen the bandage if you have numbness or tingling in your foot or if your  foot turns cold and blue.   Managing pain, stiffness, and swelling  If directed, put ice on the painful area. ? If you have a removable brace or elastic bandage, remove it as told by your health care provider. ? Put ice in a plastic bag. ? Place a towel between your skin and the bag. ? Leave the ice on for 20 minutes, 2-3 times a day.  Move your toes often to avoid stiffness and to lessen swelling.  Raise (elevate) your ankle above the level of your heart while you are sitting or lying down.   General instructions  Record information about your pain. Writing down the following may be helpful for you and your health care provider: ? How often you have ankle pain. ? Where the pain is located. ? What the pain feels like.  If treatment involves wearing a prescribed shoe or insole, make sure you wear it correctly and for as long as told by your health care provider.  Take over-the-counter and prescription medicines only as told by your health care provider.  Keep all follow-up visits as told by your health care provider. This is important. Contact a health care provider if:  Your pain gets worse.  Your pain is not relieved with medicines.  You have a fever or chills.  You are having more  trouble with walking.  You have new symptoms. Get help right away if:  Your foot, leg, toes, or ankle: ? Tingles or becomes numb. ? Becomes swollen. ? Turns pale or blue. Summary  Ankle pain can occur on either side or the back of one ankle or both ankles.  Ankle pain may be sharp and burning or dull and aching.  Rest your ankle as told by your health care provider. If told, apply ice to the area.  Take over-the-counter and prescription medicines only as told by your health care provider. This information is not intended to replace advice given to you by your health care provider. Make sure you discuss any questions you have with your health care provider. Document Revised: 10/20/2018  Document Reviewed: 01/07/2018 Elsevier Patient Education  [place].

## 2020-11-22 ENCOUNTER — Other Ambulatory Visit (HOSPITAL_COMMUNITY): Payer: Self-pay

## 2020-11-24 ENCOUNTER — Other Ambulatory Visit: Payer: Self-pay

## 2020-11-24 MED FILL — Apixaban Tab 5 MG: ORAL | 30 days supply | Qty: 60 | Fill #1 | Status: AC

## 2020-11-28 ENCOUNTER — Other Ambulatory Visit: Payer: Self-pay

## 2020-11-28 ENCOUNTER — Ambulatory Visit (INDEPENDENT_AMBULATORY_CARE_PROVIDER_SITE_OTHER): Payer: Self-pay | Admitting: Sports Medicine

## 2020-11-28 VITALS — BP 133/76 | Ht 65.0 in | Wt 285.0 lb

## 2020-11-28 DIAGNOSIS — Z8616 Personal history of COVID-19: Secondary | ICD-10-CM

## 2020-11-28 DIAGNOSIS — M25572 Pain in left ankle and joints of left foot: Secondary | ICD-10-CM | POA: Insufficient documentation

## 2020-11-28 MED ORDER — PREDNISONE 10 MG PO TABS
ORAL_TABLET | ORAL | 0 refills | Status: DC
  Filled 2020-11-28: qty 21, 6d supply, fill #0

## 2020-11-28 NOTE — Progress Notes (Signed)
    SUBJECTIVE:   CHIEF COMPLAINT / HPI:   Left Ankle Pain Theresa Pearson is a 55y/o female who presents today for follow up due to left ankle pain. It started 2 weeks ago with no trauma or inciting event. It hurts to walk on it and she is limping as a result. The pain is sharp and dull and is getting worse. It is slightly swollen as well. She went to urgent care and got x-rays and it was negative for fracture. She is taking tylenol and using Voltaren gel with little benefit. Pain is diffuse over the ankle but mainly lateral and over the talus. She did take Gabapentin and it did help. She cannot tolerate oral NSAIDs.  PERTINENT  PMH / PSH: Left ankle fracture in 2011 with internal fixation and subsequent removal of screws  OBJECTIVE:   BP 133/76   Ht 5\' 5"  (1.651 m)   Wt 285 lb (129.3 kg)   BMI 47.43 kg/m  No flowsheet data found.  Ankle/Foot, Left: No visible erythema, mild swelling medial and lateral, ecchymosis, no bony deformity. No evidence of tibiotalar deviation; Range of motion is full in all directions but pain with dorsiflexion. Strength is 5/5 in all directions. No tenderness at the  of the Achilles tendon; Positive for peroneal tendon tenderness but no subluxation; No tenderness on posterior aspects of lateral and medial malleolus; Stable lateral and medial ligaments; Talar dome tender; walks with limp. Negative anterior drawer test, negative talar tilt test.   ASSESSMENT/PLAN:   Left ankle pain Unclear etiology at this time but suspect at least involvement of the peroneal tendons. Could be peroneal tenondoitis. Her x-ray which I did review from urgent care did show signs of OA.  - Place in boot for 2-3 days for comfort - Do "ABC" exercises daily - Prednisone dose pack - F/u in 2 weeks; if no improvement consider MRI   Of note, patient reports history of COVID and was placed on supplemental oxygen after hospitalization. She has not been seen by a pulmonologist so sending in a  referal to Pulmonology today.   Nuala Alpha, DO PGY-4, Sports Medicine Fellow [place]  Patient seen and evaluated with the sports medicine fellow.  I agree with the above plan of care.  Treatment as above and follow-up in 2 weeks.  If no improvement consider MRI.  Call with questions or concerns in the interim.

## 2020-11-28 NOTE — Assessment & Plan Note (Signed)
Unclear etiology at this time but suspect at least involvement of the peroneal tendons. Could be peroneal tenondoitis. Her x-ray which I did review from urgent care did show signs of OA.  - Place in boot for 2-3 days for comfort - Do "ABC" exercises daily - Prednisone dose pack - F/u in 2 weeks; if no improvement consider MRI

## 2020-11-28 NOTE — Patient Instructions (Addendum)
It was great to meet you today! Thank you for letting me participate in your care!  Today, we discussed your left ankle pain which is in part due to some inflammation of the peroneal tendons. You also have some evidence of arthritis on your x-ray. I am placing you in a boot to use for comfort. Please come out of it after you start to feel better. Please do your "ABCs" exercises once per day. Take prednisone as directed.  I will see you back in 2 weeks to see how you are progressing and if you are not better we may need to get additional imaging.  Be well, Harolyn Rutherford, DO PGY-4, Sports Medicine Fellow [place]

## 2020-11-29 ENCOUNTER — Other Ambulatory Visit: Payer: Self-pay

## 2020-12-01 ENCOUNTER — Other Ambulatory Visit: Payer: Self-pay

## 2020-12-01 MED FILL — Escitalopram Oxalate Tab 20 MG (Base Equiv): ORAL | 30 days supply | Qty: 30 | Fill #1 | Status: AC

## 2020-12-01 MED FILL — Lisinopril Tab 40 MG: ORAL | 30 days supply | Qty: 30 | Fill #1 | Status: AC

## 2020-12-01 MED FILL — Potassium Chloride Microencapsulated Crys ER Tab 20 mEq: ORAL | 30 days supply | Qty: 60 | Fill #1 | Status: AC

## 2020-12-01 MED FILL — Chlorthalidone Tab 50 MG: ORAL | 30 days supply | Qty: 30 | Fill #1 | Status: AC

## 2020-12-01 MED FILL — Metoprolol Tartrate Tab 50 MG: ORAL | 30 days supply | Qty: 60 | Fill #1 | Status: AC

## 2020-12-04 ENCOUNTER — Other Ambulatory Visit: Payer: Self-pay

## 2020-12-04 ENCOUNTER — Encounter: Payer: Self-pay | Admitting: Internal Medicine

## 2020-12-04 ENCOUNTER — Ambulatory Visit: Payer: MEDICAID | Attending: Internal Medicine | Admitting: Internal Medicine

## 2020-12-04 VITALS — BP 135/84 | HR 74 | Resp 16 | Wt 303.0 lb

## 2020-12-04 DIAGNOSIS — J9611 Chronic respiratory failure with hypoxia: Secondary | ICD-10-CM

## 2020-12-04 DIAGNOSIS — Z1231 Encounter for screening mammogram for malignant neoplasm of breast: Secondary | ICD-10-CM

## 2020-12-04 DIAGNOSIS — F411 Generalized anxiety disorder: Secondary | ICD-10-CM

## 2020-12-04 DIAGNOSIS — D509 Iron deficiency anemia, unspecified: Secondary | ICD-10-CM

## 2020-12-04 DIAGNOSIS — F331 Major depressive disorder, recurrent, moderate: Secondary | ICD-10-CM

## 2020-12-04 MED ORDER — BUSPIRONE HCL 5 MG PO TABS
5.0000 mg | ORAL_TABLET | Freq: Two times a day (BID) | ORAL | 2 refills | Status: DC
  Filled 2020-12-04: qty 60, 30d supply, fill #0
  Filled 2021-01-01: qty 60, 30d supply, fill #1
  Filled 2021-01-31: qty 60, 30d supply, fill #2

## 2020-12-04 NOTE — Progress Notes (Signed)
Pt states her pain is coming from all over her body  

## 2020-12-04 NOTE — Patient Instructions (Signed)
Stop hydroxyzine.  Start BuSpar twice a day instead to help with the anxiety. I have referred you to behavioral health for some counseling.  I have referred you to pulmonary. Please go to the radiology department at [facility] to have x-rays of the chest done.

## 2020-12-04 NOTE — Progress Notes (Signed)
Patient ID: Theresa CaveKimberly Jakubek, female    DOB: Oct 18, 1965  MRN: [id number]  CC: Follow-up on chronic hypoxia  Subjective: Theresa Pearson is a 55 y.o. female who presents for follow-up on hypoxia Her concerns today include:  Patient with history of HTN, prediabetes, anxiety disorder, fibromyalgia, HL, vitamin D deficiency, mild intermittent asthma, mild microcytic anemia, COVID pneumonia 07/2020 on home O2, ischemic RT arm s/p.  Thromboembolectomy 08/2020 (hx of DVT post partum 2019)  Patient on home O2 post COVID-pneumonia diagnosed in January of this year.   On last visit we were able to wean oxygen level down to 1 L.  Since then however, patient states she had to increase her O2 back to 2 L because her levels were dropping on 1 L and she felt more short of breath.  This was worse at nights when she tried to sleep.  With ambulation she reports O2 level is between 90 to 94%.  She still coughs sometimes.  She feels just not back to her baseline since having COVID.  Fibromyalgia chronic widespread pain is also worse. -Referred for sleep study on last visit.  She has not been called with appointment as yet.  She will have Medicaid as of the sixth of next month. -She has applied for disability and has pulmonary function test scheduled with them in August.  Depression/anxiety: She is requesting something different other than hydroxyzine for anxiety.  She states it works but it makes her just too sleepy all the time.  She is on Lexapro 20 mg which she has been on for 2 to 3 years.  She finds it helpful.  Her PHQ-9 score and GAD-7 score today are elevated and essentially unchanged from 3 months ago.  Reports having seen a counselor in the past and feels she would benefit from seeing a counselor at this time.  Blood tests that were done on last visit revealed microcytic anemia with hemoglobin of 10.9 and MCV of 73.  Iron studies consistent with anemia of chronic disease.   HM:  Needs MMG and colon CA  screen.  Fhx of polyps in mom and sister. Patient Active Problem List   Diagnosis Date Noted  . Left ankle pain 11/28/2020  . Influenza vaccine refused 09/05/2020  . History of COVID-19 08/28/2020  . 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 Acute respiratory failure with hypoxia (HCC) 08/14/2020  . Multifocal pneumonia 08/08/2020  . Prolonged QT interval 08/08/2020  . Epigastric pain 06/24/2016  . Bilateral chronic knee pain 10/16/2015  . Severe obesity (BMI >= 40) (HCC) 10/16/2015  . Anemia 09/19/2015  . Vitamin D deficiency 09/19/2015  . Bilateral impacted cerumen 09/19/2015  . GAD (generalized anxiety disorder) 03/04/2014  . Hypokalemia 06/27/2013  . HTN (hypertension) 06/27/2013  . Sleep apnea 06/27/2013     Current Outpatient Medications on File Prior to Visit  Medication Sig Dispense Refill  . acetaminophen (TYLENOL) 500 MG tablet Take 500 mg by mouth every 6 (six) hours as needed for mild pain.    Marland Kitchen. albuterol (VENTOLIN HFA) 108 (90 Base) MCG/ACT inhaler INHALE 2 PUFFS INTO THE LUNGS EVERY 6 (SIX) HOURS AS NEEDED FOR WHEEZING OR SHORTNESS OF BREATH. 18 g 5  . apixaban (ELIQUIS) 5 MG TABS tablet TAKE 1 TABLET (5 MG TOTAL) BY MOUTH 2 (TWO) TIMES DAILY. 60 tablet 1  . benzonatate (TESSALON) 100 MG capsule TAKE 1 CAPSULE (100 MG TOTAL) BY MOUTH 3 (THREE) TIMES DAILY AS NEEDED FOR COUGH. (Patient not taking: Reported  on 12/04/2020) 30 capsule 0  . chlorthalidone (HYGROTON) 50 MG tablet TAKE 1 TABLET (50 MG TOTAL) BY MOUTH DAILY. 90 tablet 1  . cholecalciferol (VITAMIN D3) 25 MCG (1000 UNIT) tablet Take 1,000 Units by mouth daily.    Marland Kitchen dexamethasone (DECADRON) 4 MG tablet TAKE 1 & 1/2 TABLETS (6 MG) BY MOUTH DAILY FOR 7 DAYS. 11 tablet 0  . diclofenac sodium (VOLTAREN) 1 % GEL Apply 2 g topically 4 (four) times daily. (Patient taking differently: Apply 2 g topically 4 (four) times daily as needed (pain).) 100 g 0  . dicyclomine (BENTYL) 10 MG capsule Take 1-2 capsules (10-20 mg  total) by mouth every 8 (eight) hours as needed for spasms. 90 capsule 5  . diphenhydrAMINE-PSE-APAP (TYLENOL ALLERGY SINUS NIGHT PO) Take 1 tablet by mouth every 4 (four) hours as needed (headache).    . doxycycline (VIBRA-TABS) 100 MG tablet TAKE 1 TABLET BY MOUTH EVERY 12 HOURS FOR 2 DAYS. (Patient not taking: Reported on 12/04/2020) 4 tablet 0  . escitalopram (LEXAPRO) 20 MG tablet TAKE 1 TABLET (20 MG TOTAL) BY MOUTH DAILY. 90 tablet 1  . ferrous sulfate 325 (65 FE) MG tablet TAKE 1 TABLET (325 MG TOTAL) BY MOUTH DAILY WITH BREAKFAST. 100 tablet 1  . hydrOXYzine (ATARAX/VISTARIL) 25 MG tablet TAKE 1/2 TO 1 TABLET THREE TIMES DAILY AS NEEDED FOR ANXIETY (Patient taking differently: Take 12.5-25 mg by mouth every 8 (eight) hours as needed for anxiety. 1/2 to 1 tid prn anxiety) 60 tablet 1  . lisinopril (ZESTRIL) 40 MG tablet TAKE 1 TABLET (40 MG TOTAL) BY MOUTH DAILY. 90 tablet 1  . metoprolol tartrate (LOPRESSOR) 50 MG tablet TAKE 1 TABLET(50 MG) BY MOUTH TWICE DAILY (Patient taking differently: Take 50 mg by mouth 2 (two) times daily. TAKE 1 TABLET(50 MG) BY MOUTH TWICE DAILY) 180 tablet 1  . Multiple Vitamins-Minerals (MULTIVITAMIN PO) Take 1 tablet by mouth daily.    Marland Kitchen omeprazole (PRILOSEC) 20 MG capsule Take 1 capsule (20 mg total) by mouth daily. 90 capsule 3  . oxyCODONE-acetaminophen (PERCOCET/ROXICET) 5-325 MG tablet TAKE 1 TABLET BY MOUTH EVERY SIX HOURS AS NEEDED FOR MODERATE PAIN. 15 tablet 0  . potassium chloride SA (KLOR-CON) 20 MEQ tablet TAKE 1 TABLET (20 MEQ TOTAL) BY MOUTH 2 (TWO) TIMES DAILY. 60 tablet 3  . predniSONE (DELTASONE) 10 MG tablet Use as directed per doctors orders. 21 tablet 0  . vitamin B-12 (CYANOCOBALAMIN) 1000 MCG tablet Take 1,000 mcg by mouth daily.    Marland Kitchen zinc gluconate 50 MG tablet Take 50 mg by mouth daily.     No current facility-administered medications on file prior to visit.    Allergies  Allergen Reactions  . Penicillins Shortness Of Breath and Rash     Social History   Socioeconomic History  . Marital status: Divorced    Spouse name: Not on file  . Number of children: 2  . Years of education: Not on file  . Highest education level: Not on file  Occupational History  . Occupation: truck Geophysicist/field seismologist  Tobacco Use  . Smoking status: Never Smoker  . Smokeless tobacco: Never Used  Substance and Sexual Activity  . Alcohol use: Yes    Alcohol/week: 1.0 - 2.0 standard drink    Types: 1 - 2 Glasses of wine per week    Comment: occoasional.  . Drug use: No  . Sexual activity: Yes  Other Topics Concern  . Not on file  Social History Narrative  .  Not on file   Social Determinants of Health   Financial Resource Strain: Not on file  Food Insecurity: Not on file  Transportation Needs: Not on file  Physical Activity: Not on file  Stress: Not on file  Social Connections: Not on file  Intimate Partner Violence: Not on file    Family History  Problem Relation Age of Onset  . CAD Mother   . Diabetes Mellitus II Mother   . Stroke Mother   . Colon polyps Mother   . Crohn's disease Mother   . Allergy (severe) Sister   . Colon polyps Sister   . Allergy (severe) Son   . Prostate cancer Maternal Grandfather   . Diabetes Maternal Grandmother   . Hypertension Maternal Grandmother   . Heart attack Maternal Grandmother     Past Surgical History:  Procedure Laterality Date  . ABDOMINAL HYSTERECTOMY  2008   for  fibroids   . CESAREAN SECTION    . CHOLECYSTECTOMY    . THROMBECTOMY W/ EMBOLECTOMY Right 08/23/2020   Procedure: Right Upper Extremity Thrombectomy;  Surgeon: Marty Heck, MD;  Location: [facility] OR;  Service: Vascular;  Laterality: Right;    ROS: Review of Systems Negative except as stated above  PHYSICAL EXAM: BP 135/84   Pulse 74   Resp 16   Wt (!) 303 lb (137.4 kg)   SpO2 99%   BMI 50.42 kg/m   Physical Exam Pulse ox room air at rest 92%.  Room air ambulation 88 to 94% On O2 2L at rest 99%, with ambulation  96 to 99%  General appearance - alert, well appearing, morbidly obese middle-age African-American female and in no distress.  She has a portable O2 tank with her. Mental status - normal mood, behavior, speech, dress, motor activity, and thought processes Neck - supple, no significant adenopathy Chest - clear to auscultation, no wheezes, rales or rhonchi, symmetric air entry Heart - normal rate, regular rhythm, normal S1, S2, no murmurs, rubs, clicks or gallops Extremities -trace lower extremity edema  Depression screen [facility] 2/9 12/04/2020 09/05/2020 02/02/2020  Decreased Interest 2 2 2   Down, Depressed, Hopeless 2 2 3   PHQ - 2 Score 4 4 5   Altered sleeping 3 2 2   Tired, decreased energy 3 3 3   Change in appetite 3 3 3   Feeling bad or failure about yourself  1 1 1   Trouble concentrating 1 1 1   Moving slowly or fidgety/restless 0 1 0  Suicidal thoughts 0 1 0  PHQ-9 Score 15 16 15   Some recent data might be hidden   GAD 7 : Generalized Anxiety Score 12/04/2020 09/05/2020 02/02/2020 06/16/2018  Nervous, Anxious, on Edge 3 3 3 3   Control/stop worrying 2 2 2 2   Worry too much - different things 3 3 2 3   Trouble relaxing 2 2 2 2   Restless 1 1 1  0  Easily annoyed or irritable 3 3 1 3   Afraid - awful might happen 3 2 2 3   Total GAD 7 Score 17 16 13 16      CMP Latest Ref Rng & Units 08/30/2020 08/23/2020 08/23/2020  Glucose 70 - 99 mg/dL 102(H) 100(H) 101(H)  BUN 6 - 20 mg/dL 17 22(H) 19  Creatinine 0.44 - 1.00 mg/dL 0.78 0.80 0.90  Sodium 135 - 145 mmol/L 140 138 140  Potassium 3.5 - 5.1 mmol/L 3.8 4.6 4.0  Chloride 98 - 111 mmol/L 104 101 101  CO2 22 - 32 mmol/L 27 - 26  Calcium  8.9 - 10.3 mg/dL 9.3 - 8.9  Total Protein 6.5 - 8.1 g/dL - - -  Total Bilirubin 0.3 - 1.2 mg/dL - - -  Alkaline Phos 38 - 126 U/L - - -  AST 15 - 41 U/L - - -  ALT 0 - 44 U/L - - -   Lipid Panel     Component Value Date/Time   CHOL 191 02/02/2020 1616   TRIG 129 08/08/2020 1336   HDL 37 (L) 02/02/2020 1616    CHOLHDL 5.2 (H) 02/02/2020 1616   LDLCALC 129 (H) 02/02/2020 1616    CBC    Component Value Date/Time   WBC 9.1 09/05/2020 1220   WBC 8.0 08/30/2020 1028   RBC 4.94 09/05/2020 1220   RBC 4.43 08/30/2020 1028   HGB 10.9 (L) 09/05/2020 1220   HCT 35.8 09/05/2020 1220   PLT 446 09/05/2020 1220   MCV 73 (L) 09/05/2020 1220   MCH 22.1 (L) 09/05/2020 1220   MCH 22.1 (L) 08/30/2020 1028   MCHC 30.4 (L) 09/05/2020 1220   MCHC 30.3 08/30/2020 1028   RDW 17.3 (H) 09/05/2020 1220   LYMPHSABS 2.1 08/23/2020 1254   LYMPHSABS 2.8 02/02/2020 1616   MONOABS 0.8 08/23/2020 1254   EOSABS 0.1 08/23/2020 1254   EOSABS 0.1 02/02/2020 1616   BASOSABS 0.0 08/23/2020 1254   BASOSABS 0.0 02/02/2020 1616    ASSESSMENT AND PLAN: 1. Chronic hypoxemic respiratory failure (HCC) -Post COVID. Based on ambulation today, she needs the O2 mainly with ambulation and probably at night.  I will have my CMA follow-up with [place] sleep study lab to make sure she is scheduled for her sleep study.  I have referred her to pulmonary for further evaluation - DG Chest 2 View; Future - Ambulatory referral to Pulmonology  2. GAD (generalized anxiety disorder) Stop hydroxyzine. Start BuSpar instead.  Continue Lexapro. - busPIRone (BUSPAR) 5 MG tablet; Take 1 tablet (5 mg total) by mouth 2 (two) times daily.  Dispense: 60 tablet; Refill: 2 - Ambulatory referral to Psychiatry  3. Major depressive disorder, recurrent episode, moderate (HCC) Continue Lexapro.  Referred to behavioral health for counseling per her request. - Ambulatory referral to Psychiatry  4. Encounter for screening mammogram for malignant neoplasm of breast - MM Digital Screening; Future  5. Microcytic anemia - Hgb Fractionation [place]   Patient was given the opportunity to ask questions.  Patient verbalized understanding of the plan and was able to repeat key elements of the plan.   No orders of the defined types were placed in this  encounter.    Requested Prescriptions    No prescriptions requested or ordered in this encounter    No follow-ups on file.  Karle Plumber, MD, FACP

## 2020-12-06 ENCOUNTER — Encounter (INDEPENDENT_AMBULATORY_CARE_PROVIDER_SITE_OTHER): Payer: Self-pay | Admitting: Obstetrics & Gynecology

## 2020-12-06 ENCOUNTER — Ambulatory Visit (INDEPENDENT_AMBULATORY_CARE_PROVIDER_SITE_OTHER): Payer: Commercial Managed Care - POS | Admitting: Obstetrics & Gynecology

## 2020-12-06 VITALS — BP 112/66 | Wt 136.0 lb

## 2020-12-06 DIAGNOSIS — Z1212 Encounter for screening for malignant neoplasm of rectum: Secondary | ICD-10-CM

## 2020-12-06 DIAGNOSIS — Z1231 Encounter for screening mammogram for malignant neoplasm of breast: Secondary | ICD-10-CM

## 2020-12-06 DIAGNOSIS — Z01419 Encounter for gynecological examination (general) (routine) without abnormal findings: Secondary | ICD-10-CM

## 2020-12-06 LAB — POCT OCCULT BLOOD STOOL: Stool Occult Blood: NEGATIVE

## 2020-12-06 LAB — HGB FRACTIONATION CASCADE
Hgb A2: 2.4 % (ref 1.8–3.2)
Hgb A: 97.6 % (ref 96.4–98.8)
Hgb F: 0 % (ref 0.0–2.0)
Hgb S: 0 %

## 2020-12-06 NOTE — Progress Notes (Signed)
Subjective:       Elizabeth Dougherty is a 55 y.o. female here for a routine exam.  Current complaints: none.  Personal health questionnaire reviewed: yes. Pt denies any VB, d/c, dysuria, or dyspareunia. Nl BM's. Occasional stress loss of urine. Older daughter got into [employer] for Dance in [place], younger daughter is in 10th grade, and son is in 6th. F/u sono last year showed a stable 6 cm left exophytic adnexal cyst.     Gynecologic History  Patient's last menstrual period was 11/12/2017.  Contraception: none  Breast self exam: discussed  Common GYN tests  Last Pap Date: 11/27/18  Last Pap Result: Normal  Last Mammo Date: 01/31/20  Last Mammo Result: Normal    The following portions of the patient's history were reviewed and updated as appropriate: allergies, current medications, past family history, past medical history, past social history, past surgical history and problem list.      Review of Systems  Pertinent items are noted in HPI.      Objective:      BP 112/66   Wt 136 lb (61.7 kg)   LMP 11/12/2017   BMI 23.34 kg/m   General appearance: alert, appears stated age and cooperative  Neck: no adenopathy, supple, symmetrical, trachea midline and thyroid not enlarged, symmetric, no tenderness/mass/nodules  Breasts: normal appearance, no masses or tenderness, No nipple retraction or dimpling, No nipple discharge or bleeding, No axillary or supraclavicular adenopathy  Abdomen: soft, non-tender; bowel sounds normal; no masses,  no organomegaly  Pelvic exam:     Urinary system: urethral meatus normal   External genitalia: normal general appearance   Vaginal: normal rugae   Cervix: normal appearance   Adnexa: normal bimanual exam   Uterus: normal single, nontender   Rectal: good sphincter tone, no masses and guaiac negative          Assessment:      Healthy female exam.        Plan:      Mammogram ordered.  Follow up in: 1 year.  Thin prep pap/HPV No secondary to nl Pap in 2020 with negative HR HPV  Recommended colon  cancer screening. Discussed risks vs benefits of Cologuard vs colonoscopy. Cologuard ordered

## 2020-12-07 ENCOUNTER — Telehealth: Payer: Self-pay | Admitting: Internal Medicine

## 2020-12-07 DIAGNOSIS — J9611 Chronic respiratory failure with hypoxia: Secondary | ICD-10-CM

## 2020-12-07 NOTE — Telephone Encounter (Signed)
Pt is calling to request a script for a portable concentrator to [facility]. Pt reports that she will be traveling out of town 12/08/20. Please advise [phone]

## 2020-12-08 NOTE — Telephone Encounter (Signed)
Rx has been faxed to [facility]

## 2020-12-12 ENCOUNTER — Ambulatory Visit: Payer: Self-pay | Admitting: Internal Medicine

## 2020-12-12 ENCOUNTER — Ambulatory Visit: Payer: Self-pay | Admitting: Sports Medicine

## 2020-12-15 ENCOUNTER — Other Ambulatory Visit: Payer: Self-pay

## 2020-12-15 ENCOUNTER — Ambulatory Visit (INDEPENDENT_AMBULATORY_CARE_PROVIDER_SITE_OTHER): Payer: Self-pay | Admitting: Pulmonary Disease

## 2020-12-15 ENCOUNTER — Encounter: Payer: Self-pay | Admitting: Pulmonary Disease

## 2020-12-15 VITALS — BP 116/68 | HR 70 | Temp 96.5°F | Ht 66.0 in | Wt 300.4 lb

## 2020-12-15 DIAGNOSIS — J4550 Severe persistent asthma, uncomplicated: Secondary | ICD-10-CM

## 2020-12-15 DIAGNOSIS — Z8616 Personal history of COVID-19: Secondary | ICD-10-CM

## 2020-12-15 DIAGNOSIS — E662 Morbid (severe) obesity with alveolar hypoventilation: Secondary | ICD-10-CM

## 2020-12-15 DIAGNOSIS — Z6841 Body Mass Index (BMI) 40.0 and over, adult: Secondary | ICD-10-CM

## 2020-12-15 DIAGNOSIS — R0683 Snoring: Secondary | ICD-10-CM

## 2020-12-15 MED ORDER — BREO ELLIPTA 100-25 MCG/INH IN AEPB
1.0000 | INHALATION_SPRAY | Freq: Every day | RESPIRATORY_TRACT | 5 refills | Status: DC
  Filled 2020-12-15: qty 60, 30d supply, fill #0
  Filled 2021-01-10 – 2021-01-11 (×2): qty 60, 30d supply, fill #1
  Filled 2021-02-19: qty 60, 30d supply, fill #2

## 2020-12-15 NOTE — Patient Instructions (Signed)
Breo one puff daily, and rinse your mouth after each use  Albuterol two puffs every 4 to 6 hours as needed for cough, wheeze, chest congestion, or shortness of breath  Will arrange for in lab sleep study  Please get a copy of your breathing test and bring to our office for review  Follow up in 4 weeks with Dr. Halford Chessman or Nurse Practitioner

## 2020-12-15 NOTE — Progress Notes (Signed)
[facility]  Chief Complaint  Patient presents with  . Consult    Diagnosed with COVID in January 2022. Having cough, shortness of breath since being hospitalized. Had a blood clot in February.     Constitutional:  BP 116/68 (BP Location: Right Arm, Cuff Size: Normal)   Pulse 70   Temp (!) 96.5 F (35.8 C) (Temporal)   Ht 5\' 6"  (1.676 m)   Wt (!) 300 lb 6.4 oz (136.3 kg)   SpO2 99% Comment: 1L  BMI 48.49 kg/m   Past Medical History:  Anemia, Anxiety, OA, Headaches, Depression, Diverticulosis, Fibromyalgia, Gallstones, GERD, HTN, Pneumonia, Rt leg DVT 1994, Asthma, COVID 19 pneumonia January 2022, Ankylosing spondylitis  Past Surgical History:  She  has a past surgical history that includes Cesarean section; Cholecystectomy; Abdominal hysterectomy (2008); and Thrombectomy w/ embolectomy (Right, 08/23/2020).  Brief Summary:  Theresa Pearson is a 55 y.o. female with hypoxia after having COVID 19 pneumonia in January 2022.      Subjective:   She was found to have COVID 19 pneumonia in January 2022.  She needed oxygen while in hospital.  She was treated with steroids.  She was found to have clot in Rt arm in February and had thrombectomy.  She continues to use supplemental oxygen.  She had sleep study in 2017 that was negative for sleep apnea, but she has gained about 40 lbs since then.  She had sleep apnea before, but hasn't used CPAP since 2017.  She had mild asthma before COVID.  Not get episodes of cough, wheeze, and chest congestion several times per week.  Uses albuterol and feels improvement.  Treated with prednisone several times over the past few months for her breathing and this helps.  Unfortunately has contributed to her weight gain.  She snores and wakes up feeling choked.  Feels sleepy during the day.    No history of smoking.  Was working as Administrator, but applying for disability.  Has PFT scheduled later this month as part  of her disability exam.    Her sister has asthma and sleep apnea.  No animal/bird exposures.  Had pneumonia in 1990's.  She is from [place].  Had right leg DVT after her first child was born in 37.  She maintained SpO2 > 94% on room air while walking 3 laps in office today.  Physical Exam:   Appearance - well kempt, wearing oxygen  ENMT - no sinus tenderness, no oral exudate, no LAN, Mallampati 4 airway, no stridor  Respiratory - equal breath sounds bilaterally, no wheezing or rales  CV - s1s2 regular rate and rhythm, 2/6 systolic murmur  Ext - no clubbing, no edema  Skin - no rashes  Psych - normal mood and affect   Pulmonary testing:    Chest Imaging:   CT chest 08/08/20 >> peripheral predominant GGO typical of COVID 19 pneumonia  CT angio chest 08/30/20 >> ATX at bases  Sleep Tests:   PSG 10/31/15 >> AHI 2.5, SpO2 low 82%  Cardiac Tests:   Echo bubble 08/24/20 >> EF 65 to 70%, mild RA dilation, no shunt  Social History:  She  reports that she has never smoked. She has never used smokeless tobacco. She reports current alcohol use of about 1.0 - 2.0 standard drink of alcohol per week. She reports that she does not use drugs.  Family History:  Her family history includes Allergy (severe) in her sister and son; CAD  in her mother; Colon polyps in her mother and sister; Crohn's disease in her mother; Diabetes in her maternal grandmother; Diabetes Mellitus II in her mother; Heart attack in her maternal grandmother; Hypertension in her maternal grandmother; Prostate cancer in her maternal grandfather; Stroke in her mother.    Discussion:  She has persistent dyspnea, cough, wheeze, and chest congestion since having COVID 19 infection in January 2022.  She has history of asthma and this has likely progressed.  She also reports snoring, sleep disruption, apnea, and daytime sleepiness.  She has history of hypertension and depression.  She has gained weight since being on  several course of prednisone over the past few months.  I am concerned she could have obstructive sleep apnea.  Finally, her oxygen needs might be related to obesity hypoventilation.  Assessment/Plan:   Severe, persistent asthma. - will have her try breo - continue prn albuterol - asked her to bring copy of her PFT which she will be doing later this month  Snoring. - will see if our office staff can assist with scheduling her in lab sleep study  Obesity hypoventilation syndrome. - continue 2 liters oxygen with sleep for now - discussed importance of weight loss  Time Spent Involved in Patient Care on Day of Examination:  48 minutes  Follow up:  Patient Instructions  Breo one puff daily, and rinse your mouth after each use  Albuterol two puffs every 4 to 6 hours as needed for cough, wheeze, chest congestion, or shortness of breath  Will arrange for in lab sleep study  Please get a copy of your breathing test and bring to our office for review  Follow up in 4 weeks with Dr. Halford Chessman or Nurse Practitioner   Medication List:   Allergies as of 12/15/2020      Reactions   Penicillins Shortness Of Breath, Rash      Medication List       Accurate as of December 15, 2020  2:32 PM. If you have any questions, ask your nurse or doctor.        STOP taking these medications   dexamethasone 4 MG tablet Commonly known as: DECADRON Stopped by: Chesley Mires, MD   predniSONE 10 MG tablet Commonly known as: DELTASONE Stopped by: Chesley Mires, MD     TAKE these medications   acetaminophen 500 MG tablet Commonly known as: TYLENOL Take 500 mg by mouth every 6 (six) hours as needed for mild pain.   albuterol 108 (90 Base) MCG/ACT inhaler Commonly known as: VENTOLIN HFA INHALE 2 PUFFS INTO THE LUNGS EVERY 6 (SIX) HOURS AS NEEDED FOR WHEEZING OR SHORTNESS OF BREATH.   Breo Ellipta 100-25 MCG/INH Aepb Generic drug: fluticasone furoate-vilanterol Inhale 1 puff into the lungs daily. Started  by: Chesley Mires, MD   busPIRone 5 MG tablet Commonly known as: BUSPAR Take 1 tablet (5 mg total) by mouth 2 (two) times daily.   chlorthalidone 50 MG tablet Commonly known as: HYGROTON TAKE 1 TABLET (50 MG TOTAL) BY MOUTH DAILY.   cholecalciferol 25 MCG (1000 UNIT) tablet Commonly known as: VITAMIN D3 Take 1,000 Units by mouth daily.   diclofenac sodium 1 % Gel Commonly known as: Voltaren Apply 2 g topically 4 (four) times daily. What changed:   when to take this  reasons to take this   dicyclomine 10 MG capsule Commonly known as: BENTYL Take 1-2 capsules (10-20 mg total) by mouth every 8 (eight) hours as needed for spasms.   Eliquis  5 MG Tabs tablet Generic drug: apixaban TAKE 1 TABLET (5 MG TOTAL) BY MOUTH 2 (TWO) TIMES DAILY.   escitalopram 20 MG tablet Commonly known as: LEXAPRO TAKE 1 TABLET (20 MG TOTAL) BY MOUTH DAILY.   FeroSul 325 (65 FE) MG tablet Generic drug: ferrous sulfate TAKE 1 TABLET (325 MG TOTAL) BY MOUTH DAILY WITH BREAKFAST.   lisinopril 40 MG tablet Commonly known as: ZESTRIL TAKE 1 TABLET (40 MG TOTAL) BY MOUTH DAILY.   metoprolol tartrate 50 MG tablet Commonly known as: LOPRESSOR TAKE 1 TABLET(50 MG) BY MOUTH TWICE DAILY What changed:   how much to take  how to take this  when to take this  additional instructions   MULTIVITAMIN PO Take 1 tablet by mouth daily.   omeprazole 20 MG capsule Commonly known as: PRILOSEC Take 1 capsule (20 mg total) by mouth daily.   oxyCODONE-acetaminophen 5-325 MG tablet Commonly known as: PERCOCET/ROXICET TAKE 1 TABLET BY MOUTH EVERY SIX HOURS AS NEEDED FOR MODERATE PAIN.   potassium chloride SA 20 MEQ tablet Commonly known as: KLOR-CON TAKE 1 TABLET (20 MEQ TOTAL) BY MOUTH 2 (TWO) TIMES DAILY.   TYLENOL ALLERGY SINUS NIGHT PO Take 1 tablet by mouth every 4 (four) hours as needed (headache).   vitamin B-12 1000 MCG tablet Commonly known as: CYANOCOBALAMIN Take 1,000 mcg by mouth daily.    zinc gluconate 50 MG tablet Take 50 mg by mouth daily.       Signature:  Chesley Mires, MD [place] Pager - [phone] 12/15/2020, 2:32 PM

## 2020-12-19 ENCOUNTER — Ambulatory Visit: Payer: Self-pay | Admitting: Sports Medicine

## 2020-12-26 ENCOUNTER — Other Ambulatory Visit: Payer: Self-pay

## 2020-12-26 ENCOUNTER — Other Ambulatory Visit: Payer: Self-pay | Admitting: Physician Assistant

## 2020-12-26 MED ORDER — ELIQUIS 5 MG PO TABS
1.0000 | ORAL_TABLET | Freq: Two times a day (BID) | ORAL | 1 refills | Status: DC
  Filled 2020-12-26: qty 60, 30d supply, fill #0
  Filled 2021-01-22: qty 60, 30d supply, fill #1

## 2020-12-28 ENCOUNTER — Ambulatory Visit (INDEPENDENT_AMBULATORY_CARE_PROVIDER_SITE_OTHER): Payer: Self-pay | Admitting: Sports Medicine

## 2020-12-28 ENCOUNTER — Other Ambulatory Visit: Payer: Self-pay

## 2020-12-28 VITALS — BP 133/94 | Ht 65.0 in | Wt 300.0 lb

## 2020-12-28 DIAGNOSIS — M25571 Pain in right ankle and joints of right foot: Secondary | ICD-10-CM

## 2020-12-29 ENCOUNTER — Other Ambulatory Visit: Payer: Self-pay

## 2020-12-29 NOTE — Progress Notes (Signed)
   Subjective:    Patient ID: Theresa Pearson, female    DOB: Jan 16, 1966, 55 y.o.   MRN: [id number]  HPI chief complaint: Right ankle pain  Theresa Pearson presents today complaining of lateral right ankle pain.  Pain is identical to the pain she experienced in her left ankle in May.  That pain resolved with treatment including brief period of immobilization, Sterapred Dosepak, and home exercises.  Her current pain has been present for a couple of weeks.  She has noticed some swelling as well.  She denies any recent trauma to the foot or ankle.    Review of Systems As above    Objective:   Physical Exam  Well-developed, well-nourished.  No acute distress  Right ankle: Good range of motion.  No effusion.  Pes planus with standing.  There is some swelling along the lateral ankle along the course of the peroneal tendons.  She is tender to palpation here as well.  Good strength.  Good pulses.  She is walking with a noticeable limp.      Assessment & Plan:   Right ankle pain secondary to peroneal tendinitis  I think she should immobilize her right ankle in a cam walker till she is able to ambulate without a limp.  She can then transition to a body helix compression sleeve.  At some point we will need to fit her shoes with some inserts to help correct her pes planus and pronation.  Follow-up with me again in 3 to 4 weeks.

## 2021-01-01 ENCOUNTER — Other Ambulatory Visit: Payer: Self-pay | Admitting: Physician Assistant

## 2021-01-01 DIAGNOSIS — I10 Essential (primary) hypertension: Secondary | ICD-10-CM

## 2021-01-01 MED ORDER — POTASSIUM CHLORIDE CRYS ER 20 MEQ PO TBCR
EXTENDED_RELEASE_TABLET | Freq: Two times a day (BID) | ORAL | 0 refills | Status: DC
Start: 2021-01-01 — End: 2021-06-22
  Filled 2021-01-01: qty 60, 30d supply, fill #0
  Filled 2021-03-26 – 2021-04-04 (×2): qty 60, 30d supply, fill #1

## 2021-01-01 MED FILL — Escitalopram Oxalate Tab 20 MG (Base Equiv): ORAL | 30 days supply | Qty: 30 | Fill #2 | Status: AC

## 2021-01-01 MED FILL — Ferrous Sulfate Tab 325 MG (65 MG Elemental Fe): ORAL | Qty: 100 | Fill #0 | Status: CN

## 2021-01-01 MED FILL — Albuterol Sulfate Inhal Aero 108 MCG/ACT (90MCG Base Equiv): RESPIRATORY_TRACT | 25 days supply | Qty: 18 | Fill #0 | Status: AC

## 2021-01-01 MED FILL — Lisinopril Tab 40 MG: ORAL | 30 days supply | Qty: 30 | Fill #2 | Status: AC

## 2021-01-01 MED FILL — Chlorthalidone Tab 50 MG: ORAL | 30 days supply | Qty: 30 | Fill #2 | Status: AC

## 2021-01-01 MED FILL — Metoprolol Tartrate Tab 50 MG: ORAL | 30 days supply | Qty: 60 | Fill #2 | Status: AC

## 2021-01-01 NOTE — Telephone Encounter (Signed)
Future OV 02/26/21 Approved per protocol. Requested Prescriptions  Pending Prescriptions Disp Refills  . potassium chloride SA (KLOR-CON) 20 MEQ tablet 60 tablet 3    Sig: TAKE 1 TABLET (20 MEQ TOTAL) BY MOUTH 2 (TWO) TIMES DAILY.     Endocrinology:  Minerals - Potassium Supplementation Passed - 01/01/2021  4:57 PM      Passed - K in normal range and within 360 days    Potassium  Date Value Ref Range Status  08/30/2020 3.8 3.5 - 5.1 mmol/L Final         Passed - Cr in normal range and within 360 days    Creat  Date Value Ref Range Status  06/21/2016 0.68 0.50 - 1.10 mg/dL Final   Creatinine, Ser  Date Value Ref Range Status  08/30/2020 0.78 0.44 - 1.00 mg/dL Final         Passed - Valid encounter within last 12 months    Recent Outpatient Visits          4 weeks ago Chronic hypoxemic respiratory failure ([place])   [place] Karle Plumber B, MD   3 months ago Acute respiratory failure with hypoxia [facility]   [place] Ladell Pier, MD   4 months ago Hospital discharge follow-up   [place], MD   11 months ago Screening cholesterol level   [place], [place], [place]   1 year ago Need for influenza vaccination   [place], RPH-CPP      Future Appointments            In 1 week Chesley Mires, MD [facility]   In 1 month Wynetta Emery Dalbert Batman, MD [place]

## 2021-01-02 ENCOUNTER — Other Ambulatory Visit: Payer: Self-pay

## 2021-01-03 ENCOUNTER — Other Ambulatory Visit: Payer: Self-pay

## 2021-01-08 ENCOUNTER — Telehealth: Payer: Self-pay

## 2021-01-08 NOTE — Telephone Encounter (Signed)
Been trying to reach out to pt on multiple attempts in regards to see if her insurance has become effective so that we can have the sleep center reach out to her to schedule sleep study. Haven't had any luck getting in contact with her. I have left several messages and haven't received a call back.

## 2021-01-11 ENCOUNTER — Other Ambulatory Visit: Payer: Self-pay

## 2021-01-12 ENCOUNTER — Encounter: Payer: Self-pay | Admitting: Pulmonary Disease

## 2021-01-12 ENCOUNTER — Other Ambulatory Visit: Payer: Self-pay

## 2021-01-12 ENCOUNTER — Ambulatory Visit (INDEPENDENT_AMBULATORY_CARE_PROVIDER_SITE_OTHER): Payer: Self-pay | Admitting: Pulmonary Disease

## 2021-01-12 VITALS — BP 120/70 | HR 73 | Temp 98.0°F | Ht 65.0 in | Wt 299.0 lb

## 2021-01-12 DIAGNOSIS — J4550 Severe persistent asthma, uncomplicated: Secondary | ICD-10-CM

## 2021-01-12 DIAGNOSIS — R0683 Snoring: Secondary | ICD-10-CM

## 2021-01-12 DIAGNOSIS — Z6841 Body Mass Index (BMI) 40.0 and over, adult: Secondary | ICD-10-CM

## 2021-01-12 MED ORDER — MONTELUKAST SODIUM 10 MG PO TABS
10.0000 mg | ORAL_TABLET | Freq: Every day | ORAL | 5 refills | Status: DC
  Filled 2021-01-12: qty 30, 30d supply, fill #0
  Filled 2021-02-19: qty 30, 30d supply, fill #1
  Filled 2021-03-26 – 2021-04-04 (×2): qty 30, 30d supply, fill #2
  Filled 2021-05-08: qty 30, 30d supply, fill #3
  Filled 2021-06-21: qty 30, 30d supply, fill #4
  Filled 2021-07-30: qty 30, 30d supply, fill #0

## 2021-01-12 NOTE — Patient Instructions (Signed)
Singulair 10 mg pill nightly  Will schedule pulmonary function test  Can use mucinex as needed to help with cough  Follow up in 2 months

## 2021-01-12 NOTE — Progress Notes (Signed)
[facility]  Chief Complaint  Patient presents with   Follow-up    Still having issues with breathing.      Constitutional:  BP 120/70   Pulse 73   Temp 98 F (36.7 C) (Oral)   Ht 5\' 5"  (1.651 m)   Wt 299 lb (135.6 kg)   SpO2 99% Comment: on 2 liters  BMI 49.76 kg/m   Past Medical History:  Anemia, Anxiety, OA, Headaches, Depression, Diverticulosis, Fibromyalgia, Gallstones, GERD, HTN, Pneumonia, Rt leg DVT 1994, Asthma, COVID 19 pneumonia January 2022, Ankylosing spondylitis  Past Surgical History:  She  has a past surgical history that includes Cesarean section; Cholecystectomy; Abdominal hysterectomy (2008); and Thrombectomy w/ embolectomy (Right, 08/23/2020).  Brief Summary:  Theresa Pearson is a 55 y.o. female with hypoxia after having COVID 19 pneumonia in January 2022.      Subjective:   She feels that breo has helped.  Her lungs don't hurt anymore.  She still has chest congestion.  Using 2 liters oxygen.  Has sleep study scheduled for August.  Physical Exam:   Appearance - well kempt, wearing oxygen  ENMT - no sinus tenderness, no oral exudate, no LAN, Mallampati 4 airway, no stridor  Respiratory - equal breath sounds bilaterally, no wheezing or rales  CV - s1s2 regular rate and rhythm, 2/6 systolic murmur  Ext - no clubbing, no edema  Skin - no rashes  Psych - normal mood and affect    Pulmonary testing:  Spirometry 01/06/21 >> FEV1 1.95 (83%), FEV1% 85  Chest Imaging:  CT chest 08/08/20 >> peripheral predominant GGO typical of COVID 19 pneumonia CT angio chest 08/30/20 >> ATX at bases  Sleep Tests:  PSG 10/31/15 >> AHI 2.5, SpO2 low 82%  Cardiac Tests:  Echo bubble 08/24/20 >> EF 65 to 70%, mild RA dilation, no shunt  Social History:  She  reports that she has never smoked. She has never used smokeless tobacco. She reports current alcohol use of about 1.0 - 2.0 standard drink of alcohol per week. She  reports that she does not use drugs.  Family History:  Her family history includes Allergy (severe) in her sister and son; CAD in her mother; Colon polyps in her mother and sister; Crohn's disease in her mother; Diabetes in her maternal grandmother; Diabetes Mellitus II in her mother; Heart attack in her maternal grandmother; Hypertension in her maternal grandmother; Prostate cancer in her maternal grandfather; Stroke in her mother.     Assessment/Plan:   Severe, persistent asthma. - continue breo - add singulair - prn albuterol, mucinex - will arrange for full PFT  Snoring. - she has in lab sleep study scheduled for August 2022  Obesity hypoventilation syndrome. - continue 2 liters oxygen with sleep for now - discussed importance of weight loss  Time Spent Involved in Patient Care on Day of Examination:  24 minutes  Follow up:   Patient Instructions  Singulair 10 mg pill nightly  Will schedule pulmonary function test  Can use mucinex as needed to help with cough  Follow up in 2 months  Medication List:   Allergies as of 01/12/2021       Reactions   Penicillins Shortness Of Breath, Rash        Medication List        Accurate as of January 12, 2021 12:31 PM. If you have any questions, ask your nurse or doctor.  acetaminophen 500 MG tablet Commonly known as: TYLENOL Take 500 mg by mouth every 6 (six) hours as needed for mild pain.   albuterol 108 (90 Base) MCG/ACT inhaler Commonly known as: VENTOLIN HFA INHALE 2 PUFFS INTO THE LUNGS EVERY 6 (SIX) HOURS AS NEEDED FOR WHEEZING OR SHORTNESS OF BREATH.   Breo Ellipta 100-25 MCG/INH Aepb Generic drug: fluticasone furoate-vilanterol Inhale 1 puff into the lungs daily.   busPIRone 5 MG tablet Commonly known as: BUSPAR Take 1 tablet (5 mg total) by mouth 2 (two) times daily.   chlorthalidone 50 MG tablet Commonly known as: HYGROTON TAKE 1 TABLET (50 MG TOTAL) BY MOUTH DAILY.   cholecalciferol 25 MCG  (1000 UNIT) tablet Commonly known as: VITAMIN D3 Take 1,000 Units by mouth daily.   diclofenac sodium 1 % Gel Commonly known as: Voltaren Apply 2 g topically 4 (four) times daily. What changed:  when to take this reasons to take this   dicyclomine 10 MG capsule Commonly known as: BENTYL Take 1-2 capsules (10-20 mg total) by mouth every 8 (eight) hours as needed for spasms.   Eliquis 5 MG Tabs tablet Generic drug: apixaban TAKE 1 TABLET (5 MG TOTAL) BY MOUTH 2 (TWO) TIMES DAILY.   escitalopram 20 MG tablet Commonly known as: LEXAPRO TAKE 1 TABLET (20 MG TOTAL) BY MOUTH DAILY.   ferrous sulfate 325 (65 FE) MG tablet TAKE 1 TABLET (325 MG TOTAL) BY MOUTH DAILY WITH BREAKFAST.   lisinopril 40 MG tablet Commonly known as: ZESTRIL TAKE 1 TABLET (40 MG TOTAL) BY MOUTH DAILY.   metoprolol tartrate 50 MG tablet Commonly known as: LOPRESSOR TAKE 1 TABLET(50 MG) BY MOUTH TWICE DAILY What changed:  how much to take how to take this when to take this additional instructions   montelukast 10 MG tablet Commonly known as: SINGULAIR Take 1 tablet (10 mg total) by mouth at bedtime. Started by: Chesley Mires, MD   MULTIVITAMIN PO Take 1 tablet by mouth daily.   omeprazole 20 MG capsule Commonly known as: PRILOSEC Take 1 capsule (20 mg total) by mouth daily.   potassium chloride SA 20 MEQ tablet Commonly known as: KLOR-CON TAKE 1 TABLET (20 MEQ TOTAL) BY MOUTH 2 (TWO) TIMES DAILY.   TYLENOL ALLERGY SINUS NIGHT PO Take 1 tablet by mouth every 4 (four) hours as needed (headache).   vitamin B-12 1000 MCG tablet Commonly known as: CYANOCOBALAMIN Take 1,000 mcg by mouth daily.   zinc gluconate 50 MG tablet Take 50 mg by mouth daily.        Signature:  Chesley Mires, MD [place] Pager - [phone] 01/12/2021, 12:31 PM

## 2021-01-18 ENCOUNTER — Other Ambulatory Visit: Payer: Self-pay

## 2021-01-18 ENCOUNTER — Ambulatory Visit (INDEPENDENT_AMBULATORY_CARE_PROVIDER_SITE_OTHER): Payer: Self-pay | Admitting: Sports Medicine

## 2021-01-18 VITALS — BP 121/80 | Ht 65.0 in | Wt 299.0 lb

## 2021-01-18 DIAGNOSIS — M7670 Peroneal tendinitis, unspecified leg: Secondary | ICD-10-CM

## 2021-01-18 NOTE — Progress Notes (Signed)
   Subjective:    Patient ID: Theresa Pearson, female    DOB: January 15, 1966, 55 y.o.   MRN: [id number]  HPI  Presents today for follow-up on right ankle peroneal tendinitis.  Ankle is improving.  She wore her cam walker for about 4 days before transitioning out.  She had previous problems with the left ankle as well.  She is here today for [place] to fit her with some inserts and scaphoid pads which she has had in the past.    Review of Systems As above    Objective:   Physical Exam  Well-developed, well-nourished.  No acute distress  Examination of both ankles show full range of motion.  No effusion.  Still some mild soft tissue swelling along the course of the right peroneal tendons but no tenderness to palpation.  Good strength.  Pes planus with standing.  Pronation with walking.  Good pulses.      Assessment & Plan:   Pes planus with bilateral peroneal tendinitis  Patient is provided with green sports insoles and scaphoid pads to wear in her shoes.  I did discuss the option of custom orthotics once she is able to acquire insurance.  She will continue with activity as tolerated and will follow-up with me as needed.

## 2021-01-22 ENCOUNTER — Other Ambulatory Visit: Payer: Self-pay

## 2021-01-31 ENCOUNTER — Other Ambulatory Visit: Payer: Self-pay

## 2021-01-31 MED FILL — Lisinopril Tab 40 MG: ORAL | 30 days supply | Qty: 30 | Fill #3 | Status: AC

## 2021-01-31 MED FILL — Metoprolol Tartrate Tab 50 MG: ORAL | 30 days supply | Qty: 60 | Fill #3 | Status: AC

## 2021-01-31 MED FILL — Ferrous Sulfate Tab 325 MG (65 MG Elemental Fe): ORAL | 30 days supply | Qty: 30 | Fill #0 | Status: AC

## 2021-01-31 MED FILL — Chlorthalidone Tab 50 MG: ORAL | 30 days supply | Qty: 30 | Fill #3 | Status: AC

## 2021-01-31 MED FILL — Escitalopram Oxalate Tab 20 MG (Base Equiv): ORAL | 30 days supply | Qty: 30 | Fill #3 | Status: AC

## 2021-02-01 ENCOUNTER — Other Ambulatory Visit: Payer: Self-pay

## 2021-02-16 ENCOUNTER — Other Ambulatory Visit: Payer: Self-pay | Admitting: Obstetrics & Gynecology

## 2021-02-19 ENCOUNTER — Other Ambulatory Visit: Payer: Self-pay | Admitting: Physician Assistant

## 2021-02-20 ENCOUNTER — Other Ambulatory Visit: Payer: Self-pay

## 2021-02-20 ENCOUNTER — Ambulatory Visit (HOSPITAL_BASED_OUTPATIENT_CLINIC_OR_DEPARTMENT_OTHER): Payer: Medicaid Other | Attending: Pulmonary Disease | Admitting: Pulmonary Disease

## 2021-02-20 DIAGNOSIS — R0683 Snoring: Secondary | ICD-10-CM

## 2021-02-20 DIAGNOSIS — R0902 Hypoxemia: Secondary | ICD-10-CM | POA: Diagnosis not present

## 2021-02-20 MED ORDER — APIXABAN 5 MG PO TABS
5.0000 mg | ORAL_TABLET | Freq: Two times a day (BID) | ORAL | 9 refills | Status: DC
  Filled 2021-02-20: qty 60, 30d supply, fill #0
  Filled 2021-03-26 – 2021-04-04 (×3): qty 60, 30d supply, fill #1
  Filled 2021-05-08: qty 60, 30d supply, fill #2
  Filled 2021-05-16: qty 180, 90d supply, fill #3
  Filled 2021-08-31 – 2021-09-04 (×2): qty 180, 90d supply, fill #0
  Filled 2022-01-09: qty 60, 30d supply, fill #1

## 2021-02-21 ENCOUNTER — Telehealth: Payer: Self-pay | Admitting: Pulmonary Disease

## 2021-02-21 DIAGNOSIS — R0683 Snoring: Secondary | ICD-10-CM

## 2021-02-21 NOTE — Telephone Encounter (Signed)
PSG 02/20/21 >> AHI 0.3, SpO2 low 95%.  Used 2 liters O2.   Please let her know that her sleep study didn't show sleep apnea.  Her oxygen level looked good using  2 liters oxygen at night and she should continue this.  Will discuss in more detail during her visit on 03/23/21.

## 2021-02-21 NOTE — Procedures (Signed)
    Patient Name: Theresa Pearson, Theresa Pearson Date: 02/20/2021 Gender: Female D.O.B: August 05, 1965 Age (years): 43 Referring Provider: Chesley Mires MD, ABSM Height (inches): 65 Interpreting Physician: Chesley Mires MD, ABSM Weight (lbs): 298 RPSGT: Gwenyth Allegra BMI: 50 MRN: [id number] Neck Size: 14.00  CLINICAL INFORMATION Sleep Study Type: NPSG  Indication for sleep study: Excessive Daytime Sleepiness, Fatigue, Hypertension, Morning Headaches, Obesity, Snoring, Witnessed Apneas  Epworth Sleepiness Score: 14  Most recent polysomnogram dated 10/31/2015 revealed an AHI of 2/h and RDI of 5/h.  SLEEP STUDY TECHNIQUE As per the AASM Manual for the Scoring of Sleep and Associated Events v2.3 (April 2016) with a hypopnea requiring 4% desaturations.  The channels recorded and monitored were frontal, central and occipital EEG, electrooculogram (EOG), submentalis EMG (chin), nasal and oral airflow, thoracic and abdominal wall motion, anterior tibialis EMG, snore microphone, electrocardiogram, and pulse oximetry.  MEDICATIONS Medications self-administered by patient taken the night of the study : CHLORTHALIDONE, LEXAPRO, HYDROXYZINE, LISINOPRIL, METOPROLOL, POTASSIUM CHLORIDE er  SLEEP ARCHITECTURE The study was initiated at 10:49:01 PM and ended at 5:02:40 AM.  Sleep onset time was 23.0 minutes and the sleep efficiency was 57.3%%. The total sleep time was 214 minutes.  Stage REM latency was 141.0 minutes.  The patient spent 8.6%% of the night in stage N1 sleep, 80.4%% in stage N2 sleep, 0.0%% in stage N3 and 11% in REM.  Alpha intrusion was absent.  Supine sleep was 0.00%.  RESPIRATORY PARAMETERS The overall apnea/hypopnea index (AHI) was 0.3 per hour. There were 1 total apneas, including 1 obstructive, 0 central and 0 mixed apneas. There were 0 hypopneas and 46 RERAs.  The AHI during Stage REM sleep was 2.6 per hour.  AHI while supine was N/A per hour.  The mean oxygen saturation  was 98.1%. The minimum SpO2 during sleep was 95.0%.  She used 2 liters supplemental oxygen during the study.  moderate snoring was noted during this study.  CARDIAC DATA The 2 lead EKG demonstrated sinus rhythm. The mean heart rate was 61.7 beats per minute. Other EKG findings include: None.  LEG MOVEMENT DATA The total PLMS were 0 with a resulting PLMS index of 0.0. Associated arousal with leg movement index was 0.0 .  IMPRESSIONS - No significant obstructive sleep apnea occurred during this study.  Overall AHI 0.3 with SpO2 low of 95%.  She used 2 liters supplemental oxygen during the study. - The patient snored with moderate snoring volume. - No cardiac abnormalities were noted during this study. - Clinically significant periodic limb movements did not occur during sleep. No significant associated arousals.  DIAGNOSIS - Nocturnal Hypoxemia (G47.36)  RECOMMENDATIONS - Continue 2 liters oxygen at night. - Avoid alcohol, sedatives and other CNS depressants that may worsen sleep apnea and disrupt normal sleep architecture. - Sleep hygiene should be reviewed to assess factors that may improve sleep quality. - Weight management and regular exercise should be initiated or continued if appropriate.  [Electronically signed] 02/21/2021 01:13 PM  Chesley Mires MD, ABSM Diplomate, American Board of Sleep Medicine   NPI: [id number]  [place] PH: [phone]   FX: [phone] [place]

## 2021-02-21 NOTE — Telephone Encounter (Signed)
Called and spoke with patient to let her know of message from Dr. Halford Chessman about sleep study. She expressed understanding. Nothing further needed at this time.

## 2021-02-26 ENCOUNTER — Telehealth: Payer: Self-pay | Admitting: Internal Medicine

## 2021-03-05 ENCOUNTER — Other Ambulatory Visit: Payer: Self-pay | Admitting: Internal Medicine

## 2021-03-05 ENCOUNTER — Other Ambulatory Visit: Payer: Self-pay

## 2021-03-05 DIAGNOSIS — F411 Generalized anxiety disorder: Secondary | ICD-10-CM

## 2021-03-05 DIAGNOSIS — I10 Essential (primary) hypertension: Secondary | ICD-10-CM

## 2021-03-05 MED FILL — Metoprolol Tartrate Tab 50 MG: ORAL | 30 days supply | Qty: 60 | Fill #4 | Status: AC

## 2021-03-05 NOTE — Telephone Encounter (Signed)
Notes to clinic:  Patient canceled appointment that was on 02/26/2021 Review for refills   Requested Prescriptions  Pending Prescriptions Disp Refills   escitalopram (LEXAPRO) 20 MG tablet 90 tablet 1    Sig: TAKE 1 TABLET (20 MG TOTAL) BY MOUTH DAILY.     Psychiatry:  Antidepressants - SSRI Passed - 03/05/2021  2:27 PM      Passed - Valid encounter within last 6 months    Recent Outpatient Visits           3 months ago Chronic hypoxemic respiratory failure [facility])   [place] Karle Plumber B, MD   6 months ago Acute respiratory failure with hypoxia [facility])   [place], Deborah B, MD   6 months ago Hospital discharge follow-up   [place], MD   1 year ago Screening cholesterol level   [place], Dionne Bucy, [place]   1 year ago Need for influenza vaccination   [place], RPH-CPP       Future Appointments             In 2 weeks Chesley Mires, MD [facility]             chlorthalidone (HYGROTON) 50 MG tablet 90 tablet 1    Sig: TAKE 1 TABLET (50 MG TOTAL) BY MOUTH DAILY.     Cardiovascular: Diuretics - Thiazide Passed - 03/05/2021  2:27 PM      Passed - Ca in normal range and within 360 days    Calcium  Date Value Ref Range Status  08/30/2020 9.3 8.9 - 10.3 mg/dL Final   Calcium, Ion  Date Value Ref Range Status  08/23/2020 1.13 (L) 1.15 - 1.40 mmol/L Final          Passed - Cr in normal range and within 360 days    Creat  Date Value Ref Range Status  06/21/2016 0.68 0.50 - 1.10 mg/dL Final   Creatinine, Ser  Date Value Ref Range Status  08/30/2020 0.78 0.44 - 1.00 mg/dL Final          Passed - K in normal range and within 360 days    Potassium  Date Value Ref Range Status  08/30/2020 3.8 3.5 - 5.1 mmol/L Final          Passed -  Na in normal range and within 360 days    Sodium  Date Value Ref Range Status  08/30/2020 140 135 - 145 mmol/L Final  02/02/2020 140 134 - 144 mmol/L Final          Passed - Last BP in normal range    BP Readings from Last 1 Encounters:  01/18/21 121/80          Passed - Valid encounter within last 6 months    Recent Outpatient Visits           3 months ago Chronic hypoxemic respiratory failure [facility])   [place] Karle Plumber B, MD   6 months ago Acute respiratory failure with hypoxia [facility])   [place], MD   6 months ago Hospital discharge follow-up   [place] Ladell Pier, MD   1 year ago Screening cholesterol level   [facility] [place], Dionne Bucy, [place]   1 year ago Need for influenza vaccination   [place], Jarome [place], RPH-CPP       Future Appointments             In 2 weeks Chesley Mires, MD [facility]             lisinopril (ZESTRIL) 40 MG tablet 90 tablet 1    Sig: TAKE 1 TABLET (40 MG TOTAL) BY MOUTH DAILY.     Cardiovascular:  ACE Inhibitors Failed - 03/05/2021  2:27 PM      Failed - Cr in normal range and within 180 days    Creat  Date Value Ref Range Status  06/21/2016 0.68 0.50 - 1.10 mg/dL Final   Creatinine, Ser  Date Value Ref Range Status  08/30/2020 0.78 0.44 - 1.00 mg/dL Final          Failed - K in normal range and within 180 days    Potassium  Date Value Ref Range Status  08/30/2020 3.8 3.5 - 5.1 mmol/L Final          Passed - Patient is not pregnant      Passed - Last BP in normal range    BP Readings from Last 1 Encounters:  01/18/21 121/80          Passed - Valid encounter within last 6 months    Recent Outpatient Visits           3 months ago Chronic hypoxemic respiratory failure ([place])   [place] Karle Plumber B, MD   6 months ago Acute respiratory failure with hypoxia [facility])   [place], Deborah B, MD   6 months ago Hospital discharge follow-up   [place], MD   1 year ago Screening cholesterol level   [place], [place], [place]   1 year ago Need for influenza vaccination   [place], Stephen L, RPH-CPP       Future Appointments             In 2 weeks Chesley Mires, MD  [facility]             busPIRone (BUSPAR) 5 MG tablet 60 tablet 2    Sig: Take 1 tablet (5 mg total) by mouth 2 (two) times daily.     Psychiatry: Anxiolytics/Hypnotics - Non-controlled Passed - 03/05/2021  2:27 PM      Passed - Valid encounter within last 6 months    Recent Outpatient Visits           3 months ago Chronic hypoxemic respiratory failure [facility])   [place] Karle Plumber B, MD   6 months ago Acute respiratory failure with hypoxia [facility])   [place], MD   6 months ago Hospital discharge follow-up   [place], MD   1 year ago Screening cholesterol level   [place], Dionne Bucy, [place]   1 year ago Need for influenza vaccination   [place], RPH-CPP       Future Appointments  In 2 weeks Chesley Mires, MD [facility]

## 2021-03-08 ENCOUNTER — Other Ambulatory Visit: Payer: Self-pay

## 2021-03-08 MED ORDER — ESCITALOPRAM OXALATE 20 MG PO TABS
ORAL_TABLET | Freq: Every day | ORAL | 1 refills | Status: DC
  Filled 2021-03-08: qty 90, 90d supply, fill #0

## 2021-03-08 MED ORDER — CHLORTHALIDONE 50 MG PO TABS
ORAL_TABLET | Freq: Every day | ORAL | 0 refills | Status: DC
  Filled 2021-03-08: qty 90, 90d supply, fill #0

## 2021-03-08 MED ORDER — LISINOPRIL 40 MG PO TABS
ORAL_TABLET | Freq: Every day | ORAL | 0 refills | Status: DC
  Filled 2021-03-08 (×2): qty 90, 90d supply, fill #0

## 2021-03-08 MED ORDER — BUSPIRONE HCL 5 MG PO TABS
5.0000 mg | ORAL_TABLET | Freq: Two times a day (BID) | ORAL | 2 refills | Status: DC
  Filled 2021-03-08: qty 60, 30d supply, fill #0
  Filled 2021-04-04: qty 60, 30d supply, fill #1

## 2021-03-09 ENCOUNTER — Other Ambulatory Visit: Payer: Self-pay

## 2021-03-23 ENCOUNTER — Encounter: Payer: Self-pay | Admitting: Pulmonary Disease

## 2021-03-23 ENCOUNTER — Ambulatory Visit (INDEPENDENT_AMBULATORY_CARE_PROVIDER_SITE_OTHER): Payer: Medicaid Other

## 2021-03-23 ENCOUNTER — Other Ambulatory Visit: Payer: Self-pay

## 2021-03-23 ENCOUNTER — Ambulatory Visit (INDEPENDENT_AMBULATORY_CARE_PROVIDER_SITE_OTHER): Payer: Medicaid Other | Admitting: Pulmonary Disease

## 2021-03-23 VITALS — BP 122/72 | HR 76 | Temp 97.5°F | Ht 65.0 in | Wt 305.2 lb

## 2021-03-23 DIAGNOSIS — R042 Hemoptysis: Secondary | ICD-10-CM

## 2021-03-23 DIAGNOSIS — Z6841 Body Mass Index (BMI) 40.0 and over, adult: Secondary | ICD-10-CM

## 2021-03-23 DIAGNOSIS — J4550 Severe persistent asthma, uncomplicated: Secondary | ICD-10-CM | POA: Diagnosis not present

## 2021-03-23 DIAGNOSIS — R040 Epistaxis: Secondary | ICD-10-CM

## 2021-03-23 DIAGNOSIS — E662 Morbid (severe) obesity with alveolar hypoventilation: Secondary | ICD-10-CM

## 2021-03-23 NOTE — Patient Instructions (Signed)
Chest xray today and will call with results   Will make sure your pulmonary function test gets scheduled  Will arrange for referral to ENT provider to assess nose bleeds  Follow up in 2 months

## 2021-03-23 NOTE — Progress Notes (Signed)
[name] [facility]  Chief Complaint  Patient presents with   Follow-up    Some asthma symptoms, cough up blood times 3. Not sure from nose or lungs. Breo works well     Constitutional:  BP 122/72 (BP Location: Left Arm, Cuff Size: Normal)   Pulse 76   Temp (!) 97.5 F (36.4 C) (Oral)   Ht '5\' 5"'$  (1.651 m)   Wt (!) 305 lb 3.2 oz (138.4 kg)   SpO2 100%   BMI 50.79 kg/m   Past Medical History:  Anemia, Anxiety, OA, Headaches, Depression, Diverticulosis, Fibromyalgia, Gallstones, GERD, HTN, Pneumonia, Rt leg DVT 1994, Asthma, COVID 19 pneumonia January 2022, Ankylosing spondylitis  Past Surgical History:  She  has a past surgical history that includes Cesarean section; Cholecystectomy; Abdominal hysterectomy (2008); and Thrombectomy w/ embolectomy (Right, 08/23/2020).  Brief Summary:  Theresa Pearson is a 55 y.o. female with hypoxia after having COVID 19 pneumonia in January 2022.      Subjective:   She has been getting frequent nose bleeds.  She has also been getting episodes of cough and coughing up blood.  Last happened 4 days ago.  No wheeze, fever, or chest pain.  She has been feeling congestion in her sinuses still.  She feels singulair has helped her breathing and sinuses some.  She had sleep study in August.  No sleep apnea.  She needed to use 2 liters oxygen.  She requested a POC.    Physical Exam:   Appearance - well kempt, wearing oxygen  ENMT - no sinus tenderness, no oral exudate, no LAN, Mallampati 3 airway, no stridor, area of crusted blood on septum in right nostril  Respiratory - equal breath sounds bilaterally, no wheezing or rales  CV - s1s2 regular rate and rhythm, no murmurs  Ext - no clubbing, no edema  Skin - no rashes  Psych - normal mood and affect     Pulmonary testing:  Spirometry 01/06/21 >> FEV1 1.95 (83%), FEV1% 85  Chest Imaging:  CT chest 08/08/20 >> peripheral predominant GGO typical of COVID 19  pneumonia CT angio chest 08/30/20 >> ATX at bases  Sleep Tests:  PSG 10/31/15 >> AHI 2.5, SpO2 low 82% PSG 02/20/21 >> AHI 0.3, SpO2 low 95%.  Used 2 liters O2.  Cardiac Tests:  Echo bubble 08/24/20 >> EF 65 to 70%, mild RA dilation, no shunt  Social History:  She  reports that she has never smoked. She has never used smokeless tobacco. She reports current alcohol use of about 1.0 - 2.0 standard drink per week. She reports that she does not use drugs.  Family History:  Her family history includes Allergy (severe) in her sister and son; CAD in her mother; Colon polyps in her mother and sister; Crohn's disease in her mother; Diabetes in her maternal grandmother; Diabetes Mellitus II in her mother; Heart attack in her maternal grandmother; Hypertension in her maternal grandmother; Prostate cancer in her maternal grandfather; Stroke in her mother.     Assessment/Plan:   Severe, persistent asthma. - continue breo and singulair - prn albuterol and mucinex - will make sure her PFT gets scheduled  Recurrent epistaxis with intermittent hemoptysis. - chest xray today - will arrange for referral to ENT - advised her to trim the nasal cannula for her oxygen set up  Obesity hypoventilation syndrome. - 2 liters with exertion and sleep - will have her do 6 minute walk and then determine if she would  be eligible for a POC - discussed importance of weight loss  Time Spent Involved in Patient Care on Day of Examination:  32 minutes  Follow up:   Patient Instructions  Chest xray today and will call with results   Will make sure your pulmonary function test gets scheduled  Will arrange for referral to ENT provider to assess nose bleeds  Follow up in 2 months  Medication List:   Allergies as of 03/23/2021       Reactions   Penicillins Shortness Of Breath, Rash        Medication List        Accurate as of March 23, 2021  2:39 PM. If you have any questions, ask your nurse or  doctor.          acetaminophen 500 MG tablet Commonly known as: TYLENOL Take 500 mg by mouth every 6 (six) hours as needed for mild pain.   albuterol 108 (90 Base) MCG/ACT inhaler Commonly known as: VENTOLIN HFA INHALE 2 PUFFS INTO THE LUNGS EVERY 6 (SIX) HOURS AS NEEDED FOR WHEEZING OR SHORTNESS OF BREATH.   Breo Ellipta 100-25 MCG/INH Aepb Generic drug: fluticasone furoate-vilanterol Inhale 1 puff into the lungs daily.   busPIRone 5 MG tablet Commonly known as: BUSPAR Take 1 tablet (5 mg total) by mouth 2 (two) times daily.   chlorthalidone 50 MG tablet Commonly known as: HYGROTON TAKE 1 TABLET (50 MG TOTAL) BY MOUTH DAILY.   cholecalciferol 25 MCG (1000 UNIT) tablet Commonly known as: VITAMIN D3 Take 1,000 Units by mouth daily.   diclofenac sodium 1 % Gel Commonly known as: Voltaren Apply 2 g topically 4 (four) times daily. What changed:  when to take this reasons to take this   dicyclomine 10 MG capsule Commonly known as: BENTYL Take 1-2 capsules (10-20 mg total) by mouth every 8 (eight) hours as needed for spasms.   Eliquis 5 MG Tabs tablet Generic drug: apixaban Take 1 tablet (5 mg total) by mouth 2 (two) times daily.   escitalopram 20 MG tablet Commonly known as: LEXAPRO TAKE 1 TABLET (20 MG TOTAL) BY MOUTH DAILY.   FeroSul 325 (65 FE) MG tablet Generic drug: ferrous sulfate TAKE 1 TABLET (325 MG TOTAL) BY MOUTH DAILY WITH BREAKFAST.   lisinopril 40 MG tablet Commonly known as: ZESTRIL TAKE 1 TABLET (40 MG TOTAL) BY MOUTH DAILY.   metoprolol tartrate 50 MG tablet Commonly known as: LOPRESSOR TAKE 1 TABLET(50 MG) BY MOUTH TWICE DAILY What changed:  how much to take how to take this when to take this additional instructions   montelukast 10 MG tablet Commonly known as: SINGULAIR Take 1 tablet (10 mg total) by mouth at bedtime.   MULTIVITAMIN PO Take 1 tablet by mouth daily.   omeprazole 20 MG capsule Commonly known as: PRILOSEC Take 1  capsule (20 mg total) by mouth daily.   potassium chloride SA 20 MEQ tablet Commonly known as: KLOR-CON TAKE 1 TABLET (20 MEQ TOTAL) BY MOUTH 2 (TWO) TIMES DAILY.   TYLENOL ALLERGY SINUS NIGHT PO Take 1 tablet by mouth every 4 (four) hours as needed (headache).   vitamin B-12 1000 MCG tablet Commonly known as: CYANOCOBALAMIN Take 1,000 mcg by mouth daily.   zinc gluconate 50 MG tablet Take 50 mg by mouth daily.        Signature:  Chesley Mires, MD [place] Pager - [phone] 03/23/2021, 2:39 PM

## 2021-03-26 ENCOUNTER — Other Ambulatory Visit: Payer: Self-pay

## 2021-03-26 ENCOUNTER — Other Ambulatory Visit: Payer: Self-pay | Admitting: Pulmonary Disease

## 2021-03-27 ENCOUNTER — Other Ambulatory Visit: Payer: Self-pay

## 2021-03-28 ENCOUNTER — Other Ambulatory Visit: Payer: Self-pay

## 2021-03-29 ENCOUNTER — Other Ambulatory Visit: Payer: Self-pay

## 2021-03-29 MED ORDER — FLUTICASONE FUROATE-VILANTEROL 100-25 MCG/INH IN AEPB
1.0000 | INHALATION_SPRAY | Freq: Every day | RESPIRATORY_TRACT | 3 refills | Status: DC
  Filled 2021-03-29: qty 60, 60d supply, fill #0
  Filled 2021-03-29 – 2021-04-06 (×3): qty 60, 30d supply, fill #0

## 2021-04-02 ENCOUNTER — Other Ambulatory Visit: Payer: Self-pay

## 2021-04-04 ENCOUNTER — Other Ambulatory Visit: Payer: Self-pay

## 2021-04-05 ENCOUNTER — Other Ambulatory Visit: Payer: Self-pay

## 2021-04-06 ENCOUNTER — Other Ambulatory Visit: Payer: Self-pay

## 2021-04-06 ENCOUNTER — Telehealth: Payer: Self-pay | Admitting: Pulmonary Disease

## 2021-04-06 NOTE — Telephone Encounter (Signed)
I called and spoke with patient regarding message. Looks like patient did not qualify for on POC on walk so Dr. Halford Chessman wanted a 6MW done so I called and scheduled patient for that next week. Patient verbalized understanding, nothing further needed.

## 2021-04-10 ENCOUNTER — Other Ambulatory Visit: Payer: Self-pay | Admitting: Pulmonary Disease

## 2021-04-10 ENCOUNTER — Other Ambulatory Visit: Payer: Self-pay

## 2021-04-10 MED ORDER — FLUTICASONE-SALMETEROL 250-50 MCG/ACT IN AEPB
1.0000 | INHALATION_SPRAY | Freq: Two times a day (BID) | RESPIRATORY_TRACT | 11 refills | Status: DC
  Filled 2021-04-10: qty 60, 30d supply, fill #0

## 2021-04-10 MED ORDER — FLUTICASONE FUROATE-VILANTEROL 100-25 MCG/INH IN AEPB
1.0000 | INHALATION_SPRAY | Freq: Every day | RESPIRATORY_TRACT | 3 refills | Status: DC
Start: 2021-04-10 — End: 2021-04-10
  Filled 2021-04-10: qty 60, 30d supply, fill #0

## 2021-04-10 NOTE — Telephone Encounter (Signed)
Should be okay to use breo and lexapro together.

## 2021-04-10 NOTE — Progress Notes (Signed)
Breo not covered by insurance, but advair is.  Script sent for advair.

## 2021-04-10 NOTE — Telephone Encounter (Signed)
Called and spoke with patient to let her know recs from Dr. Halford Chessman. She expressed understanding. Nothing further needed at this time.

## 2021-04-10 NOTE — Telephone Encounter (Signed)
Spoke with the pt  She states Breo never refilled  We sent rx for this on 03/29/21  Looks like there was a warning from the pharmacy that the [facility] taken with Lexapro can cause prolonged QT interval  Please advise if still okay with rx being sent, thanks

## 2021-04-11 ENCOUNTER — Other Ambulatory Visit: Payer: Self-pay

## 2021-04-12 ENCOUNTER — Ambulatory Visit: Payer: Medicaid Other

## 2021-04-13 ENCOUNTER — Other Ambulatory Visit: Payer: Self-pay

## 2021-04-18 ENCOUNTER — Ambulatory Visit: Payer: Medicaid Other

## 2021-04-18 ENCOUNTER — Other Ambulatory Visit: Payer: Self-pay

## 2021-04-18 DIAGNOSIS — J9601 Acute respiratory failure with hypoxia: Secondary | ICD-10-CM

## 2021-05-01 ENCOUNTER — Other Ambulatory Visit: Payer: Self-pay

## 2021-05-01 MED ORDER — ALLOPURINOL 100 MG PO TABS
ORAL_TABLET | ORAL | 2 refills | Status: DC
  Filled 2021-05-01: qty 30, 30d supply, fill #0
  Filled 2021-06-21: qty 30, 30d supply, fill #1
  Filled 2021-07-24 – 2021-07-27 (×3): qty 30, 30d supply, fill #0

## 2021-05-01 MED ORDER — METOPROLOL TARTRATE 50 MG PO TABS
ORAL_TABLET | ORAL | 2 refills | Status: DC
  Filled 2021-05-01 – 2021-07-30 (×2): qty 60, 30d supply, fill #0
  Filled 2021-08-28: qty 60, 30d supply, fill #1

## 2021-05-01 MED ORDER — PREDNISONE 10 MG PO TABS
ORAL_TABLET | ORAL | 0 refills | Status: DC
  Filled 2021-05-01: qty 21, 6d supply, fill #0

## 2021-05-01 MED ORDER — DULOXETINE HCL 20 MG PO CPEP
ORAL_CAPSULE | ORAL | 0 refills | Status: DC
  Filled 2021-05-01: qty 30, 15d supply, fill #0

## 2021-05-01 MED ORDER — DULOXETINE HCL 60 MG PO CPEP
ORAL_CAPSULE | ORAL | 2 refills | Status: DC
  Filled 2021-05-01: qty 30, 30d supply, fill #0
  Filled 2021-06-21: qty 30, 30d supply, fill #1
  Filled 2021-07-24 – 2021-07-27 (×2): qty 30, 30d supply, fill #0

## 2021-05-03 ENCOUNTER — Other Ambulatory Visit: Payer: Self-pay

## 2021-05-04 ENCOUNTER — Other Ambulatory Visit: Payer: Self-pay

## 2021-05-08 ENCOUNTER — Other Ambulatory Visit: Payer: Self-pay

## 2021-05-14 ENCOUNTER — Other Ambulatory Visit: Payer: Self-pay

## 2021-05-14 MED ORDER — ATORVASTATIN CALCIUM 10 MG PO TABS
ORAL_TABLET | ORAL | 3 refills | Status: DC
  Filled 2021-05-14: qty 30, 30d supply, fill #0
  Filled 2021-06-21: qty 30, 30d supply, fill #1
  Filled 2021-07-30: qty 30, 30d supply, fill #0
  Filled 2021-08-28: qty 30, 30d supply, fill #1

## 2021-05-16 ENCOUNTER — Other Ambulatory Visit: Payer: Self-pay

## 2021-05-17 ENCOUNTER — Other Ambulatory Visit: Payer: Self-pay

## 2021-05-18 ENCOUNTER — Other Ambulatory Visit: Payer: Self-pay | Admitting: Pulmonary Disease

## 2021-05-19 LAB — SARS CORONAVIRUS 2 (TAT 6-24 HRS): SARS Coronavirus 2: NEGATIVE

## 2021-05-21 ENCOUNTER — Ambulatory Visit (INDEPENDENT_AMBULATORY_CARE_PROVIDER_SITE_OTHER): Payer: Medicaid Other | Admitting: Pulmonary Disease

## 2021-05-21 ENCOUNTER — Other Ambulatory Visit (HOSPITAL_COMMUNITY): Payer: Self-pay

## 2021-05-21 ENCOUNTER — Other Ambulatory Visit: Payer: Self-pay

## 2021-05-21 ENCOUNTER — Ambulatory Visit: Payer: Medicaid Other | Admitting: Pulmonary Disease

## 2021-05-21 ENCOUNTER — Encounter: Payer: Self-pay | Admitting: Pulmonary Disease

## 2021-05-21 ENCOUNTER — Telehealth: Payer: Self-pay | Admitting: Pharmacy Technician

## 2021-05-21 VITALS — BP 108/60 | HR 77 | Temp 97.8°F | Ht 65.0 in | Wt 305.0 lb

## 2021-05-21 DIAGNOSIS — J4550 Severe persistent asthma, uncomplicated: Secondary | ICD-10-CM

## 2021-05-21 DIAGNOSIS — R011 Cardiac murmur, unspecified: Secondary | ICD-10-CM | POA: Diagnosis not present

## 2021-05-21 DIAGNOSIS — E662 Morbid (severe) obesity with alveolar hypoventilation: Secondary | ICD-10-CM

## 2021-05-21 DIAGNOSIS — R0609 Other forms of dyspnea: Secondary | ICD-10-CM

## 2021-05-21 LAB — PULMONARY FUNCTION TEST
DL/VA % pred: 135 %
DL/VA: 5.76 ml/min/mmHg/L
DLCO cor % pred: 101 %
DLCO cor: 21.68 ml/min/mmHg
DLCO unc % pred: 101 %
DLCO unc: 21.68 ml/min/mmHg
FEF 25-75 Post: 3.3 L/sec
FEF 25-75 Pre: 3.28 L/sec
FEF2575-%Change-Post: 0 %
FEF2575-%Pred-Post: 139 %
FEF2575-%Pred-Pre: 138 %
FEV1-%Change-Post: 5 %
FEV1-%Pred-Post: 97 %
FEV1-%Pred-Pre: 92 %
FEV1-Post: 2.27 L
FEV1-Pre: 2.15 L
FEV1FVC-%Change-Post: 3 %
FEV1FVC-%Pred-Pre: 109 %
FEV6-%Change-Post: 1 %
FEV6-%Pred-Post: 87 %
FEV6-%Pred-Pre: 85 %
FEV6-Post: 2.48 L
FEV6-Pre: 2.44 L
FEV6FVC-%Pred-Post: 103 %
FEV6FVC-%Pred-Pre: 103 %
FVC-%Change-Post: 1 %
FVC-%Pred-Post: 84 %
FVC-%Pred-Pre: 83 %
FVC-Post: 2.48 L
FVC-Pre: 2.44 L
Post FEV1/FVC ratio: 92 %
Post FEV6/FVC ratio: 100 %
Pre FEV1/FVC ratio: 88 %
Pre FEV6/FVC Ratio: 100 %
RV % pred: 64 %
RV: 1.24 L
TLC % pred: 70 %
TLC: 3.65 L

## 2021-05-21 MED ORDER — TRELEGY ELLIPTA 100-62.5-25 MCG/ACT IN AEPB: 1.0000 | INHALATION_SPRAY | Freq: Every day | RESPIRATORY_TRACT | 0 refills | Status: DC

## 2021-05-21 MED ORDER — TRELEGY ELLIPTA 100-62.5-25 MCG/ACT IN AEPB
1.0000 | INHALATION_SPRAY | Freq: Every day | RESPIRATORY_TRACT | 5 refills | Status: DC
  Filled 2021-05-21: qty 60, 30d supply, fill #0

## 2021-05-21 NOTE — Progress Notes (Signed)
[place] [facility]  Chief Complaint  Patient presents with   Follow-up    PFT review asthma    Past Surgical History:  She  has a past surgical history that includes Cesarean section; Cholecystectomy; Abdominal hysterectomy (2008); and Thrombectomy w/ embolectomy (Right, 08/23/2020).  Past Medical History:  Anemia, Anxiety, OA, Headaches, Depression, Diverticulosis, Fibromyalgia, Gallstones, GERD, HTN, Pneumonia, Rt leg DVT 1994, Asthma, COVID 19 pneumonia January 2022, Ankylosing spondylitis  Constitutional:  BP 108/60 (BP Location: Left Arm, Cuff Size: Large)   Pulse 77   Temp 97.8 F (36.6 C) (Oral)   Ht 5\' 5"  (1.651 m)   Wt (!) 305 lb (138.3 kg)   SpO2 100% Comment: 2L  BMI 50.75 kg/m   Brief Summary:  Theresa Pearson is a 55 y.o. female with hypoxia after having COVID 19 pneumonia in January 2022.      Subjective:   She had PFT today.  Showed mild restriction, but otherwise normal.  She continues to have cough, wheeze, and chest congestion.  Breo worked better than advair, but wasn't approved by insurance.  Not having sinus congestion or sneezing.  Still intermittently gets nose bleeds, but not as frequent.  She had to reschedule ENT appointment because she was exposed to [place].  She is still having trouble with her stamina and breathing and feels palpitations.  She would like to see a cardiologist.  Physical Exam:   Appearance - well kempt, wearing oxygen  ENMT - no sinus tenderness, no oral exudate, no LAN, Mallampati 3 airway, no stridor  Respiratory - equal breath sounds bilaterally, no wheezing or rales  CV - s1s2 regular rate and rhythm, no murmurs  Ext - no clubbing, no edema  Skin - no rashes  Psych - normal mood and affect      Pulmonary testing:  Spirometry 01/06/21 >> FEV1 1.95 (83%), FEV1% 85 PFT 05/21/21 >> FEV1 2.27 (97%), FEV1% 92, TLC 3.65 (70%), DLCO 101%  Chest Imaging:  CT chest 08/08/20 >>  peripheral predominant GGO typical of COVID 19 pneumonia CT angio chest 08/30/20 >> ATX at bases  Sleep Tests:  PSG 10/31/15 >> AHI 2.5, SpO2 low 82% PSG 02/20/21 >> AHI 0.3, SpO2 low 95%.  Used 2 liters O2.  Cardiac Tests:  Echo bubble 08/24/20 >> EF 65 to 70%, mild RA dilation, no shunt  Social History:  She  reports that she has never smoked. She has never used smokeless tobacco. She reports current alcohol use of about 1.0 - 2.0 standard drink per week. She reports that she does not use drugs.  Family History:  Her family history includes Allergy (severe) in her sister and son; CAD in her mother; Colon polyps in her mother and sister; Crohn's disease in her mother; Diabetes in her maternal grandmother; Diabetes Mellitus II in her mother; Heart attack in her maternal grandmother; Hypertension in her maternal grandmother; Prostate cancer in her maternal grandfather; Stroke in her mother.     Assessment/Plan:   Severe, persistent asthma. - did better with breo, but not covered by insurance - has worsening symptoms since being changed to advair - will have her try trelegy 100 one puff daily - continue singulair - prn albuterol and mucinex  Recurrent epistaxis. - she has appointment with ENT later this month  Obesity hypoventilation syndrome. - 2 liters oxygen with exertion and sleep - provided contact information for [facility]  Dyspnea on exertion, palpitations, murmur. - will arrange  for referral to cardiology to assess further  Time Spent Involved in Patient Care on Day of Examination:  33 minutes  Follow up:   Patient Instructions  [facility]: [phone].  Try sample of trelegy one puff daily, and rinse your mouth after each use.  If you feel like this is helping, then get refill for trelegy.  Stop using advair once you start using trelegy.  Will arrange for referral to cardiology.  Follow up in 4  months.  Medication List:   Allergies as of 05/21/2021       Reactions   Penicillins Shortness Of Breath, Rash        Medication List        Accurate as of May 21, 2021 10:34 AM. If you have any questions, ask your nurse or doctor.          STOP taking these medications    Advair Diskus 250-50 MCG/ACT Aepb Generic drug: fluticasone-salmeterol Stopped by: Chesley Mires, MD   busPIRone 5 MG tablet Commonly known as: BUSPAR Stopped by: Chesley Mires, MD   escitalopram 20 MG tablet Commonly known as: LEXAPRO Stopped by: Chesley Mires, MD       TAKE these medications    acetaminophen 500 MG tablet Commonly known as: TYLENOL Take 500 mg by mouth every 6 (six) hours as needed for mild pain.   albuterol 108 (90 Base) MCG/ACT inhaler Commonly known as: VENTOLIN HFA INHALE 2 PUFFS INTO THE LUNGS EVERY 6 (SIX) HOURS AS NEEDED FOR WHEEZING OR SHORTNESS OF BREATH.   allopurinol 100 MG tablet Commonly known as: ZYLOPRIM Take 1 tablet by mouth daily.   atorvastatin 10 MG tablet Commonly known as: LIPITOR Take 1 tablet by mouth every evening   chlorthalidone 50 MG tablet Commonly known as: HYGROTON TAKE 1 TABLET (50 MG TOTAL) BY MOUTH DAILY.   cholecalciferol 25 MCG (1000 UNIT) tablet Commonly known as: VITAMIN D3 Take 1,000 Units by mouth daily.   diclofenac sodium 1 % Gel Commonly known as: Voltaren Apply 2 g topically 4 (four) times daily. What changed:  when to take this reasons to take this   dicyclomine 10 MG capsule Commonly known as: BENTYL Take 1-2 capsules (10-20 mg total) by mouth every 8 (eight) hours as needed for spasms.   DULoxetine 20 MG capsule Commonly known as: CYMBALTA Take 1 capsule by mouth for 3 days, then 2 capsules for 7 days, then 3 capsules daily   DULoxetine 60 MG capsule Commonly known as: CYMBALTA Take 1 capsule by mouth daily.   Eliquis 5 MG Tabs tablet Generic drug: apixaban Take 1 tablet (5 mg total) by mouth 2 (two)  times daily.   FeroSul 325 (65 FE) MG tablet Generic drug: ferrous sulfate TAKE 1 TABLET (325 MG TOTAL) BY MOUTH DAILY WITH BREAKFAST.   lisinopril 40 MG tablet Commonly known as: ZESTRIL TAKE 1 TABLET (40 MG TOTAL) BY MOUTH DAILY.   metoprolol tartrate 50 MG tablet Commonly known as: LOPRESSOR Take 1 tablet by mouth twice daily with food   montelukast 10 MG tablet Commonly known as: SINGULAIR Take 1 tablet (10 mg total) by mouth at bedtime.   MULTIVITAMIN PO Take 1 tablet by mouth daily.   omeprazole 20 MG capsule Commonly known as: PRILOSEC Take 1 capsule (20 mg total) by mouth daily.   potassium chloride SA 20 MEQ tablet Commonly known as: KLOR-CON TAKE 1 TABLET (20 MEQ TOTAL) BY MOUTH 2 (TWO) TIMES DAILY.   predniSONE 10  MG tablet Commonly known as: DELTASONE Take 6 tablets on day 1, then 5 tablets on day 2, then 4 tablets on day 3, then 3 tablets on day 4, then 2 tablets on day 5, then 1 tablet on day 6.   Trelegy Ellipta 100-62.5-25 MCG/ACT Aepb Generic drug: Fluticasone-Umeclidin-Vilant Inhale 1 puff into the lungs daily. Started by: Chesley Mires, MD   TYLENOL ALLERGY SINUS NIGHT PO Take 1 tablet by mouth every 4 (four) hours as needed (headache).   vitamin B-12 1000 MCG tablet Commonly known as: CYANOCOBALAMIN Take 1,000 mcg by mouth daily.   zinc gluconate 50 MG tablet Take 50 mg by mouth daily.        Signature:  Chesley Mires, MD [place] Pager - [phone] 05/21/2021, 10:34 AM

## 2021-05-21 NOTE — Addendum Note (Signed)
Addended by: Darliss Ridgel on: 05/21/2021 10:37 AM   Modules accepted: Orders

## 2021-05-21 NOTE — Progress Notes (Signed)
Full PFT performed today. °

## 2021-05-21 NOTE — Patient Instructions (Signed)
Full PFT performed today. °

## 2021-05-21 NOTE — Patient Instructions (Signed)
[facility]: [phone].  Try sample of trelegy one puff daily, and rinse your mouth after each use.  If you feel like this is helping, then get refill for trelegy.  Stop using advair once you start using trelegy.  Will arrange for referral to cardiology.  Follow up in 4 months.

## 2021-05-21 NOTE — Telephone Encounter (Signed)
Patient Advocate Encounter  Received notification from [employer] that prior authorization for TRELEGY is required.   PA submitted on 11.7.22 Key [id number] Status is pending   [facility] will continue to follow  Luciano Cutter, CPhT Patient Advocate Phone: [phone] Fax:  [phone]

## 2021-05-22 NOTE — Telephone Encounter (Signed)
Received a fax regarding Prior Authorization from [employer] for [place]. Authorization has been DENIED because MUST HAVE A TRIAL AND FAILURE OF TWO FORMULARY PREFERRED DRUGS, SUCH AS ADVAIR, [place], SYMBICORT. PT HAS ONLY TRIED/FAILED ADVAIR.

## 2021-05-22 NOTE — Telephone Encounter (Signed)
Routing to Dr. Halford Chessman for him to review the preferred inhalers.

## 2021-05-23 ENCOUNTER — Other Ambulatory Visit: Payer: Self-pay

## 2021-05-23 MED ORDER — BUDESONIDE-FORMOTEROL FUMARATE 160-4.5 MCG/ACT IN AERO
2.0000 | INHALATION_SPRAY | Freq: Two times a day (BID) | RESPIRATORY_TRACT | 4 refills | Status: DC
  Filled 2021-05-23: qty 10.2, 30d supply, fill #0
  Filled 2021-06-21: qty 10.2, 30d supply, fill #1
  Filled 2021-08-31 – 2021-09-04 (×2): qty 10.2, 30d supply, fill #0

## 2021-05-23 NOTE — Addendum Note (Signed)
Addended by: Dessie Coma on: 05/23/2021 09:25 AM   Modules accepted: Orders

## 2021-05-23 NOTE — Telephone Encounter (Signed)
This is stupid.  They are not equivalent therapies.  Anyway, insurance companies dictate health care.  Please let her know that her insurance company seems to know better about how to manage her health care and are requiring that she try and fail an inhaler that they approve of first before the would consider coverage for trelegy.  Please send script for symbicort 160 two puffs bid.

## 2021-05-23 NOTE — Telephone Encounter (Signed)
I called the patient and let her know that Dr. Halford Chessman is going to send in a prescription for Symbicort before she can try the Trelegy. The patient is aware and did not have any questions. Nothing further needed.

## 2021-05-24 NOTE — Progress Notes (Signed)
Cardiology Office Note:    Date:  05/24/2021   ID:  Theresa Pearson, DOB Dec 23, 1965, MRN [id number]  PCP:  Ladell Pier, MD   [facility] Providers Cardiologist:  None     Referring MD: Chesley Mires, MD   No chief complaint on file. Fatigue  History of Present Illness:    Theresa Pearson is a 55 y.o. female with a hx of HTN, chronic fatigue syndrome, anxiety, referral for dyspnea, mild restriction on PFTs, h/o DVT in 1994,COVID infection referral for decreased stamina and palpitations, requesting to see cardiology  She was diagnosed in January with COVID. She was hospitalized. She was hypoxic. Her CT chest: showed Patchy peripheral ground-glass opacities concerning for atypical/viral pneumonia. She was treated with remedesivir and azithromycin. As well as steroids. She was managed with doxy for a total of 5 days for PNA. 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 She was started on eliquis.   Since then she is still on O2. [name] states that her heart rate seems high with moving since that time. She tasted some wine and her heart rate went "through the roof". It happens daily. When she stands up she gets dizziness.She denies syncope. Her mother has slow heart rate, no pacemaker. She was treated with rocephin and azithromycin. Once she stopped the metoprolol she was fine. No SCD.  No cardiac history. She had stress test years ago in [facility] in [place], it was normal. No LHC. Mother has history of strokes and MI. ?Father hx, he died in [place] war.  Cardiology Studies 08/24/2020 Normal echo Negative bubble study   Past Medical History:  Diagnosis Date   Anemia    Ankylosing spondylitis (HCC)    Anxiety    Arthritis    Asthma    Chronic headaches    Depression    Diverticulosis    Fibromyalgia    Gallstones    GERD (gastroesophageal reflux disease)    Hypertension    Pneumonia due to  COVID-19 virus 07/2020   Right leg DVT ([place])    Tachycardia     Past Surgical History:  Procedure Laterality Date   ABDOMINAL HYSTERECTOMY  2008   for  fibroids    CESAREAN SECTION     CHOLECYSTECTOMY     THROMBECTOMY W/ EMBOLECTOMY Right 08/23/2020   Procedure: Right Upper Extremity Thrombectomy;  Surgeon: Marty Heck, MD;  Location: [facility] OR;  Service: Vascular;  Laterality: Right;    Current Medications: No outpatient medications have been marked as taking for the 05/25/21 encounter (Appointment) with Janina Mayo, MD.    Current Outpatient Medications on File Prior to Visit  Medication Sig Dispense Refill   acetaminophen (TYLENOL) 500 MG tablet Take 500 mg by mouth every 6 (six) hours as needed for mild pain.     albuterol (VENTOLIN HFA) 108 (90 Base) MCG/ACT inhaler INHALE 2 PUFFS INTO THE LUNGS EVERY 6 (SIX) HOURS AS NEEDED FOR WHEEZING OR SHORTNESS OF BREATH. 18 g 5   allopurinol (ZYLOPRIM) 100 MG tablet Take 1 tablet by mouth daily. 30 tablet 2   apixaban (ELIQUIS) 5 MG TABS tablet Take 1 tablet (5 mg total) by mouth 2 (two) times daily. 60 tablet 9   atorvastatin (LIPITOR) 10 MG tablet Take 1 tablet by mouth every evening 30 tablet 3   budesonide-formoterol (SYMBICORT) 160-4.5 MCG/ACT inhaler Inhale 2 puffs into the lungs in the morning and at bedtime. 6 g 4  chlorthalidone (HYGROTON) 50 MG tablet TAKE 1 TABLET (50 MG TOTAL) BY MOUTH DAILY. 90 tablet 0   cholecalciferol (VITAMIN D3) 25 MCG (1000 UNIT) tablet Take 1,000 Units by mouth daily.     diclofenac sodium (VOLTAREN) 1 % GEL Apply 2 g topically 4 (four) times daily. (Patient taking differently: Apply 2 g topically 4 (four) times daily as needed (pain).) 100 g 0   dicyclomine (BENTYL) 10 MG capsule Take 1-2 capsules (10-20 mg total) by mouth every 8 (eight) hours as needed for spasms. 90 capsule 5   diphenhydrAMINE-PSE-APAP (TYLENOL ALLERGY SINUS NIGHT PO) Take 1 tablet by mouth every 4 (four) hours as needed  (headache).     DULoxetine (CYMBALTA) 60 MG capsule Take 1 capsule by mouth daily. 30 capsule 2   ferrous sulfate 325 (65 FE) MG tablet TAKE 1 TABLET (325 MG TOTAL) BY MOUTH DAILY WITH BREAKFAST. 100 tablet 1   Fluticasone-Umeclidin-Vilant (TRELEGY ELLIPTA) 100-62.5-25 MCG/ACT AEPB Inhale 1 puff into the lungs daily. 60 each 5   lisinopril (ZESTRIL) 40 MG tablet TAKE 1 TABLET (40 MG TOTAL) BY MOUTH DAILY. 90 tablet 0   metoprolol tartrate (LOPRESSOR) 50 MG tablet Take 1 tablet by mouth twice daily with food 60 tablet 2   montelukast (SINGULAIR) 10 MG tablet Take 1 tablet (10 mg total) by mouth at bedtime. 30 tablet 5   Multiple Vitamins-Minerals (MULTIVITAMIN PO) Take 1 tablet by mouth daily.     omeprazole (PRILOSEC) 20 MG capsule Take 1 capsule (20 mg total) by mouth daily. 90 capsule 3   potassium chloride SA (KLOR-CON) 20 MEQ tablet TAKE 1 TABLET (20 MEQ TOTAL) BY MOUTH 2 (TWO) TIMES DAILY. 120 tablet 0   predniSONE (DELTASONE) 10 MG tablet Take 6 tablets on day 1, then 5 tablets on day 2, then 4 tablets on day 3, then 3 tablets on day 4, then 2 tablets on day 5, then 1 tablet on day 6. 21 tablet 0   vitamin B-12 (CYANOCOBALAMIN) 1000 MCG tablet Take 1,000 mcg by mouth daily.     zinc gluconate 50 MG tablet Take 50 mg by mouth daily.     DULoxetine (CYMBALTA) 20 MG capsule Take 1 capsule by mouth for 3 days, then 2 capsules for 7 days, then 3 capsules daily (Patient not taking: Reported on 05/25/2021) 30 capsule 0   No current facility-administered medications on file prior to visit.     Allergies:   Penicillins   Social History   Socioeconomic History   Marital status: Divorced    Spouse name: Not on file   Number of children: 2   Years of education: Not on file   Highest education level: Not on file  Occupational History   Occupation: truck driver  Tobacco Use   Smoking status: Never   Smokeless tobacco: Never  Vaping Use   Vaping Use: Never used  Substance and Sexual  Activity   Alcohol use: Yes    Alcohol/week: 1.0 - 2.0 standard drink    Types: 1 - 2 Glasses of wine per week    Comment: occoasional.   Drug use: No   Sexual activity: Yes  Other Topics Concern   Not on file  Social History Narrative   Lives with 2 sons.    Social Determinants of Health   Financial Resource Strain: Not on file  Food Insecurity: Not on file  Transportation Needs: Not on file  Physical Activity: Not on file  Stress: Not on file  Social Connections:  Not on file     Family History: The patient's family history includes Allergy (severe) in her sister and son; CAD in her mother; Colon polyps in her mother and sister; Crohn's disease in her mother; Diabetes in her maternal grandmother; Diabetes Mellitus II in her mother; Heart attack in her maternal grandmother; Hypertension in her maternal grandmother; Prostate cancer in her maternal grandfather; Stroke in her mother.  ROS:   Please see the history of present illness.     All other systems reviewed and are negative.  EKGs/Labs/Other Studies Reviewed:    The following studies were reviewed today:   EKG:  EKG is  ordered today.  The ekg ordered today demonstrates  NSR, poor r wave progression may be 2/2 habitus, no significant changes from prior ekg  Recent Labs: 08/08/2020: B Natriuretic Peptide 28.3 08/10/2020: ALT 31; Magnesium 2.1 08/30/2020: BUN 17; Creatinine, Ser 0.78; Potassium 3.8; Sodium 140 09/05/2020: Hemoglobin 10.9; Platelets 446  Recent Lipid Panel    Component Value Date/Time   CHOL 191 02/02/2020 1616   TRIG 129 08/08/2020 1336   HDL 37 (L) 02/02/2020 1616   CHOLHDL 5.2 (H) 02/02/2020 1616   LDLCALC 129 (H) 02/02/2020 1616     Risk Assessment/Calculations:     The 10-year ASCVD risk score (Arnett DK, et al., 2019) is: 6.3%   Values used to calculate the score:     Age: 77 years     Sex: Female     Is Non-Hispanic African American: Yes     Diabetic: No     Tobacco smoker: No      Systolic Blood Pressure: 354 mmHg     Is BP treated: Yes     HDL Cholesterol: 37 mg/dL     Total Cholesterol: 191 mg/dL       Physical Exam:    VS:    Wt Readings from Last 3 Encounters:  05/21/21 (!) 305 lb (138.3 kg)  03/23/21 (!) 305 lb 3.2 oz (138.4 kg)  02/20/21 298 lb (135.2 kg)     GEN:  Well nourished, well developed in no acute distress. obsee HEENT: Normal NECK: No JVD; No carotid bruits LYMPHATICS: No lymphadenopathy CARDIAC: RRR, no murmurs, rubs, gallops RESPIRATORY:  Clear to auscultation without rales, wheezing or rhonchi  ABDOMEN: Soft, non-tender, non-distended MUSCULOSKELETAL:  No edema; No deformity  SKIN: Warm and dry NEUROLOGIC:  Alert and oriented x 3 PSYCHIATRIC:  Normal affect   ASSESSMENT:    #Palpitations:DDX includes SVT or possible inappropriate sinus tachycardia vs steroids. with COVID. She does not have high risk features including syncope c/f arrhythmia , family hx of SCD, or abnormalities on her EKG.  If she has symptomatic SVT/inapprop sinus tachycardia will increase her metoprolol.  #S/p axillary thrombus post COVID: continue eliquis. Still has some right shoulder pain  #HTN: Well controlled. continue current hypertensive regimen.  #CVD risk: working with a nutritionist. Continue atorvastatin 10 mg daily for now, will re-address necessity on next visit. Discussed improving physical activity  PLAN:    In order of problems listed above:  Cardiac Event 7 monitor Follow up 3 months           Medication Adjustments/Labs and Tests Ordered: Current medicines are reviewed at length with the patient today.  Concerns regarding medicines are outlined above.    Signed, Janina Mayo, MD  05/24/2021 6:16 PM    [facility]

## 2021-05-25 ENCOUNTER — Other Ambulatory Visit: Payer: Self-pay

## 2021-05-25 ENCOUNTER — Ambulatory Visit: Payer: Medicaid Other | Admitting: Internal Medicine

## 2021-05-25 ENCOUNTER — Encounter: Payer: Self-pay | Admitting: Internal Medicine

## 2021-05-25 VITALS — BP 126/80 | HR 79 | Ht 65.0 in | Wt 309.6 lb

## 2021-05-25 DIAGNOSIS — R002 Palpitations: Secondary | ICD-10-CM

## 2021-05-25 NOTE — Patient Instructions (Signed)
Medication Instructions:  No Changes In Medications at this time.  *If you need a refill on your cardiac medications before your next appointment, please call your pharmacy*  Testing/Procedures: [employer] Cardiac Event Monitor Instructions Your physician has requested you wear your cardiac event monitor for 7 days, (1-30). [employer] may call or text to confirm a shipping address. The monitor will be sent to a land address via UPS. [employer] will not ship a monitor to a PO BOX. It typically takes 3-5 days to receive your monitor after it has been enrolled. [employer] will assist with USPS tracking if your package is delayed. The telephone number for [employer] is [phone]. Once you have received your monitor, please review the enclosed instructions. Instruction tutorials can also be viewed under help and settings on the enclosed cell phone. Your monitor has already been registered assigning a specific monitor serial # to you.  Applying the monitor Remove cell phone from case and turn it on. The cell phone works as Dealer and needs to be within [employer] of you at all times. The cell phone will need to be charged on a daily basis. We recommend you plug the cell phone into the enclosed charger at your bedside table every night.  Monitor batteries: You will receive two monitor batteries labelled #1 and #2. These are your recorders. Plug battery #2 onto the second connection on the enclosed charger. Keep one battery on the charger at all times. This will keep the monitor battery deactivated. It will also keep it fully charged for when you need to switch your monitor batteries. A small light will be blinking on the battery emblem when it is charging. The light on the battery emblem will remain on when the battery is fully charged.  Open package of a Monitor strip. Insert battery #1 into black hood on strip and gently squeeze monitor battery onto connection as indicated in  instruction booklet. Set aside while preparing skin.  Choose location for your strip, vertical or horizontal, as indicated in the instruction booklet. Shave to remove all hair from location. There cannot be any lotions, oils, powders, or colognes on skin where monitor is to be applied. Wipe skin clean with enclosed Saline wipe. Dry skin completely.  Peel paper labeled #1 off the back of the Monitor strip exposing the adhesive. Place the monitor on the chest in the vertical or horizontal position shown in the instruction booklet. One arrow on the monitor strip must be pointing upward. Carefully remove paper labeled #2, attaching remainder of strip to your skin. Try not to create any folds or wrinkles in the strip as you apply it.  Firmly press and release the circle in the center of the monitor battery. You will hear a small beep. This is turning the monitor battery on. The heart emblem on the monitor battery will light up every 5 seconds if the monitor battery in turned on and connected to the patient securely. Do not push and hold the circle down as this turns the monitor battery off. The cell phone will locate the monitor battery. A screen will appear on the cell phone checking the connection of your monitor strip. This may read poor connection initially but change to good connection within the next minute. Once your monitor accepts the connection you will hear a series of 3 beeps followed by a climbing crescendo of beeps. A screen will appear on the cell phone showing the two monitor strip placement options. Touch the picture that demonstrates  where you applied the monitor strip.  Your monitor strip and battery are waterproof. You are able to shower, bathe, or swim with the monitor on. They just ask you do not submerge deeper than 3 feet underwater. We recommend removing the monitor if you are swimming in a lake, river, or ocean.  Your monitor battery will need to be switched to a fully  charged monitor battery approximately once a week. The cell phone will alert you of an action which needs to be made.  On the cell phone, tap for details to reveal connection status, monitor battery status, and cell phone battery status. The green dots indicates your monitor is in good status. A red dot indicates there is something that needs your attention.  To record a symptom, click the circle on the monitor battery. In 30-60 seconds a list of symptoms will appear on the cell phone. Select your symptom and tap save. Your monitor will record a sustained or significant arrhythmia regardless of you clicking the button. Some patients do not feel the heart rhythm irregularities. [employer] will notify us of any serious or critical events.  Refer to instruction booklet for instructions on switching batteries, changing strips, the Do not disturb or Pause features, or any additional questions.  Call [employer] at [phone], to confirm your monitor is transmitting and record your baseline. They will answer any questions you may have regarding the monitor instructions at that time.  Returning the monitor to [place] all equipment back into blue box. Peel off strip of paper to expose adhesive and close box securely. There is a prepaid UPS shipping label on this box. Drop in a UPS drop box, or at a UPS facility like [employer]. You may also contact [employer] to arrange UPS to pick up monitor package at your home.  Follow-Up: At [facility], you and your health needs are our priority.  As part of our continuing mission to provide you with exceptional heart care, we have created designated Provider Care Teams.  These Care Teams include your primary Cardiologist (physician) and Advanced Practice Providers (APPs -  Physician Assistants and Nurse Practitioners) who all work together to provide you with the care you need, when you need it.  Your next appointment:   3 month(s)  The format  for your next appointment:   In Person  Provider:   Janina Mayo, MD {

## 2021-05-28 ENCOUNTER — Ambulatory Visit (INDEPENDENT_AMBULATORY_CARE_PROVIDER_SITE_OTHER): Payer: Medicaid Other

## 2021-05-28 DIAGNOSIS — R002 Palpitations: Secondary | ICD-10-CM | POA: Diagnosis not present

## 2021-06-12 ENCOUNTER — Telehealth: Payer: Self-pay | Admitting: Internal Medicine

## 2021-06-12 NOTE — Telephone Encounter (Signed)
Per Dr Harl Bowie,  "please let Theresa Pearson know her event monitor showed some early heart beats but no concerning heart rhythm problems. Would not recommend treatment unless she is having a lot of symptoms.   Will see her in Feb otherwise "    Spoke to patient  above information given , patient verbalized understanding

## 2021-06-12 NOTE — Telephone Encounter (Signed)
Theresa Pearson is calling requesting her heart monitor results.

## 2021-06-12 NOTE — Telephone Encounter (Signed)
Called spoke to patient . Informed patient  monitor result are not available office will notify her once result become available.

## 2021-06-21 ENCOUNTER — Other Ambulatory Visit: Payer: Self-pay | Admitting: Internal Medicine

## 2021-06-21 DIAGNOSIS — I10 Essential (primary) hypertension: Secondary | ICD-10-CM

## 2021-06-22 ENCOUNTER — Other Ambulatory Visit: Payer: Self-pay

## 2021-06-22 MED ORDER — CHLORTHALIDONE 50 MG PO TABS
50.0000 mg | ORAL_TABLET | Freq: Every morning | ORAL | 1 refills | Status: DC
  Filled 2021-06-22 – 2021-10-02 (×2): qty 90, 90d supply, fill #0

## 2021-06-22 MED ORDER — LISINOPRIL 40 MG PO TABS
40.0000 mg | ORAL_TABLET | Freq: Every day | ORAL | 1 refills | Status: DC
  Filled 2021-06-22 – 2021-10-02 (×2): qty 90, 90d supply, fill #0

## 2021-06-22 MED ORDER — POTASSIUM CHLORIDE CRYS ER 20 MEQ PO TBCR
EXTENDED_RELEASE_TABLET | ORAL | 2 refills | Status: DC
  Filled 2021-06-22 – 2021-10-02 (×2): qty 60, 30d supply, fill #0

## 2021-06-22 MED FILL — Albuterol Sulfate Inhal Aero 108 MCG/ACT (90MCG Base Equiv): RESPIRATORY_TRACT | 25 days supply | Qty: 18 | Fill #1 | Status: AC

## 2021-06-22 NOTE — Telephone Encounter (Signed)
Requested medication (s) are due for refill today: yes  Requested medication (s) are on the active medication list: yes  Last refill:  04/05/21  Future visit scheduled: no  Notes to clinic:  NO PCP listed, however, Dr. Wynetta Emery ordered this rx, is pt associated with this practice?   Requested Prescriptions  Pending Prescriptions Disp Refills   potassium chloride SA (KLOR-CON M) 20 MEQ tablet 120 tablet 0    Sig: TAKE 1 TABLET (20 MEQ TOTAL) BY MOUTH 2 (TWO) TIMES DAILY.     Endocrinology:  Minerals - Potassium Supplementation Passed - 06/21/2021 10:43 PM      Passed - K in normal range and within 360 days    Potassium  Date Value Ref Range Status  08/30/2020 3.8 3.5 - 5.1 mmol/L Final          Passed - Cr in normal range and within 360 days    Creat  Date Value Ref Range Status  06/21/2016 0.68 0.50 - 1.10 mg/dL Final   Creatinine, Ser  Date Value Ref Range Status  08/30/2020 0.78 0.44 - 1.00 mg/dL Final          Passed - Valid encounter within last 12 months    Recent Outpatient Visits           6 months ago Chronic hypoxemic respiratory failure ([place])   [place] Karle Plumber B, MD   9 months ago Acute respiratory failure with hypoxia [facility])   [place] Ladell Pier, MD   10 months ago Hospital discharge follow-up   [place], MD   1 year ago Screening cholesterol level   [place], [place], [place]   2 years ago Need for influenza vaccination   [place], RPH-CPP       Future Appointments             In 2 months Branch, Royetta Crochet, MD [place], [facility]

## 2021-06-25 ENCOUNTER — Other Ambulatory Visit: Payer: Self-pay

## 2021-07-03 NOTE — Telephone Encounter (Signed)
Contacted pt and gave information to schedule Mammo.

## 2021-07-18 ENCOUNTER — Telehealth: Payer: Self-pay | Admitting: Pulmonary Disease

## 2021-07-18 ENCOUNTER — Encounter (INDEPENDENT_AMBULATORY_CARE_PROVIDER_SITE_OTHER): Payer: Self-pay | Admitting: Obstetrics & Gynecology

## 2021-07-18 LAB — COLOGUARD: Cologuard Result: NEGATIVE

## 2021-07-18 NOTE — Progress Notes (Signed)
Negative Cologuard.

## 2021-07-19 ENCOUNTER — Ambulatory Visit: Payer: Medicaid Other | Admitting: Sports Medicine

## 2021-07-19 VITALS — BP 116/69 | Ht 65.0 in | Wt 308.0 lb

## 2021-07-19 DIAGNOSIS — M25511 Pain in right shoulder: Secondary | ICD-10-CM | POA: Diagnosis present

## 2021-07-19 MED ORDER — METHYLPREDNISOLONE ACETATE 40 MG/ML IJ SUSP
40.0000 mg | Freq: Once | INTRAMUSCULAR | Status: AC
  Administered 2021-07-19: 40 mg via INTRA_ARTICULAR

## 2021-07-19 NOTE — Telephone Encounter (Signed)
Called patient but she did not answer. Left message for her to call back.  

## 2021-07-19 NOTE — Progress Notes (Addendum)
° °  Subjective:    Patient ID: Theresa Pearson, female    DOB: 1965-08-30, 56 y.o.   MRN: [id number]  HPI chief complaint: Right shoulder pain  Theresa Pearson presents today complaining of right shoulder pain.  She is actually been experiencing pain since a blood clot was discovered almost a year ago.  Her pain was tolerable up until a couple of weeks ago when it intensified.  She localizes her pain to the lateral shoulder.  She is unable to raise the arm much higher than shoulder level.  She has difficulty with internal rotation as well.  Difficulty sleeping at night.  No prior shoulder injuries.  No prior shoulder surgeries.  Pain does not radiate.  She is right-hand dominant.    Review of Systems As above    Objective:   Physical Exam  Well-developed, well-nourished.  No acute distress.  Right shoulder: She has active forward flexion and abduction to about 90 degrees.  She has pain with anything past this.  Positive to can, positive Hawkins.  She does appear to have some limited passive external rotation which causes pain.  Rotator cuff strength is 5/5.  No tenderness over the bicipital groove nor over the acromioclavicular joint.  Neurovascularly intact distally.      Assessment & Plan:   2 weeks of right shoulder pain secondary to rotator cuff tendinitis versus possible early adhesive capsulitis  For diagnostic as well as therapeutic reasons I recommend a subacromial cortisone injection today.  Patient agrees.  This was tolerated without difficulty.  She will follow-up with me in 2 to 3 weeks if symptoms persist at which time I may consider a glenohumeral injection or possibly x-rays of the shoulder to rule out glenohumeral osteoarthritis. Of note, she also is complaining of some left knee pain.  She has a known meniscal tear in this knee but has not had surgery.  It has been tolerable up until about a week ago.  She is beginning to experience some knee buckling.  We will give her some isometric  quad exercises and we will evaluate this further at follow-up if she is still symptomatic.  Consent obtained and verified. Time-out conducted. Noted no overlying erythema, induration, or other signs of local infection. Skin prepped in a sterile fashion. Topical analgesic spray: Ethyl chloride. Joint: Right shoulder, subacromial Needle: 25-gauge 1.5 inch Completed without difficulty. Meds: 3 cc 1% Xylocaine, 1 cc (40 mg) Depo-Medrol  Advised to call if fevers/chills, erythema, induration, drainage, or persistent bleeding.   This note was dictated using Dragon naturally speaking software and may contain errors in syntax, spelling, or content which have not been identified prior to signing this note.

## 2021-07-24 ENCOUNTER — Other Ambulatory Visit (HOSPITAL_COMMUNITY): Payer: Self-pay

## 2021-07-25 ENCOUNTER — Other Ambulatory Visit (HOSPITAL_COMMUNITY): Payer: Self-pay

## 2021-07-25 NOTE — Telephone Encounter (Signed)
Called and spoke with Theresa Pearson stating that we would need to rewalk her to see if she does qualify for O2 as last walk 03/2021 she did not qualify for O2. Stated that we could place order for O2 but she would have to pay for it out of pocket since we did not have any qualifying sats. Stated to her that we could always move her upcoming OV up sooner with VS and she verbalized understanding. Theresa Pearson's appt has been changed for her to be seen 2/10 and did make a note for her to have a walk done to see if she qualifies for O2. Nothing further needed.

## 2021-07-25 NOTE — Telephone Encounter (Signed)
Patient Returning a call

## 2021-07-27 ENCOUNTER — Other Ambulatory Visit: Payer: Self-pay

## 2021-07-27 ENCOUNTER — Other Ambulatory Visit (HOSPITAL_COMMUNITY): Payer: Self-pay

## 2021-07-30 ENCOUNTER — Other Ambulatory Visit: Payer: Self-pay

## 2021-07-31 ENCOUNTER — Other Ambulatory Visit: Payer: Self-pay

## 2021-07-31 ENCOUNTER — Emergency Department (HOSPITAL_COMMUNITY)
Admission: EM | Admit: 2021-07-31 | Discharge: 2021-08-01 | Disposition: A | Discharge status: Home / Self Care | Payer: Medicaid Other | Attending: Emergency Medicine | Admitting: Emergency Medicine

## 2021-07-31 DIAGNOSIS — N281 Cyst of kidney, acquired: Secondary | ICD-10-CM | POA: Insufficient documentation

## 2021-07-31 DIAGNOSIS — N9489 Other specified conditions associated with female genital organs and menstrual cycle: Secondary | ICD-10-CM | POA: Diagnosis not present

## 2021-07-31 DIAGNOSIS — J45909 Unspecified asthma, uncomplicated: Secondary | ICD-10-CM | POA: Insufficient documentation

## 2021-07-31 DIAGNOSIS — Z7901 Long term (current) use of anticoagulants: Secondary | ICD-10-CM | POA: Insufficient documentation

## 2021-07-31 DIAGNOSIS — N83202 Unspecified ovarian cyst, left side: Secondary | ICD-10-CM

## 2021-07-31 DIAGNOSIS — K5730 Diverticulosis of large intestine without perforation or abscess without bleeding: Secondary | ICD-10-CM | POA: Insufficient documentation

## 2021-07-31 DIAGNOSIS — R1011 Right upper quadrant pain: Secondary | ICD-10-CM | POA: Diagnosis present

## 2021-07-31 DIAGNOSIS — R1032 Left lower quadrant pain: Secondary | ICD-10-CM

## 2021-07-31 DIAGNOSIS — E876 Hypokalemia: Secondary | ICD-10-CM | POA: Diagnosis not present

## 2021-07-31 DIAGNOSIS — Z79899 Other long term (current) drug therapy: Secondary | ICD-10-CM | POA: Insufficient documentation

## 2021-07-31 DIAGNOSIS — D72829 Elevated white blood cell count, unspecified: Secondary | ICD-10-CM | POA: Insufficient documentation

## 2021-07-31 DIAGNOSIS — I10 Essential (primary) hypertension: Secondary | ICD-10-CM | POA: Insufficient documentation

## 2021-07-31 DIAGNOSIS — D649 Anemia, unspecified: Secondary | ICD-10-CM | POA: Insufficient documentation

## 2021-07-31 LAB — URINALYSIS, ROUTINE W REFLEX MICROSCOPIC
Bilirubin Urine: NEGATIVE
Glucose, UA: NEGATIVE mg/dL
Hgb urine dipstick: NEGATIVE
Ketones, ur: NEGATIVE mg/dL
Leukocytes,Ua: NEGATIVE
Nitrite: NEGATIVE
Protein, ur: NEGATIVE mg/dL
Specific Gravity, Urine: 1.015 (ref 1.005–1.030)
pH: 7 (ref 5.0–8.0)

## 2021-07-31 LAB — COMPREHENSIVE METABOLIC PANEL
ALT: 13 U/L (ref 0–44)
AST: 12 U/L — ABNORMAL LOW (ref 15–41)
Albumin: 3.2 g/dL — ABNORMAL LOW (ref 3.5–5.0)
Alkaline Phosphatase: 94 U/L (ref 38–126)
Anion gap: 9 (ref 5–15)
BUN: 15 mg/dL (ref 6–20)
CO2: 29 mmol/L (ref 22–32)
Calcium: 8.7 mg/dL — ABNORMAL LOW (ref 8.9–10.3)
Chloride: 99 mmol/L (ref 98–111)
Creatinine, Ser: 0.78 mg/dL (ref 0.44–1.00)
GFR, Estimated: >60 mL/min (ref >60)
Glucose, Bld: 80 mg/dL (ref 70–99)
Potassium: 3.3 mmol/L — ABNORMAL LOW (ref 3.5–5.1)
Sodium: 137 mmol/L (ref 135–145)
Total Bilirubin: 0.4 mg/dL (ref 0.3–1.2)
Total Protein: 7.5 g/dL (ref 6.5–8.1)

## 2021-07-31 LAB — LIPASE, BLOOD: Lipase: 22 U/L (ref 11–51)

## 2021-07-31 LAB — I-STAT BETA HCG BLOOD, ED (MC, WL, AP ONLY): I-stat hCG, quantitative: <5 m[IU]/mL (ref <5)

## 2021-07-31 LAB — CBC
HCT: 34.7 % — ABNORMAL LOW (ref 36.0–46.0)
Hemoglobin: 10.5 g/dL — ABNORMAL LOW (ref 12.0–15.0)
MCH: 22 pg — ABNORMAL LOW (ref 26.0–34.0)
MCHC: 30.3 g/dL (ref 30.0–36.0)
MCV: 72.7 fL — ABNORMAL LOW (ref 80.0–100.0)
Platelets: 434 10*3/uL — ABNORMAL HIGH (ref 150–400)
RBC: 4.77 MIL/uL (ref 3.87–5.11)
RDW: 17.2 % — ABNORMAL HIGH (ref 11.5–15.5)
WBC: 15.2 10*3/uL — ABNORMAL HIGH (ref 4.0–10.5)
nRBC: 0 % (ref 0.0–0.2)

## 2021-07-31 NOTE — ED Triage Notes (Signed)
Pt reports she was told to come here for appendix pain. No notes from MD. States she is on oxygen for "long hauler" symptoms. States she has been on for 13 mos. Has pain on R side started 2 weeks ago comes and goes. Pt endorses nausea and diarrhea.

## 2021-07-31 NOTE — ED Notes (Signed)
Called for vitals recheck, no response.

## 2021-08-01 ENCOUNTER — Emergency Department (HOSPITAL_COMMUNITY): Payer: Medicaid Other

## 2021-08-01 ENCOUNTER — Ambulatory Visit: Payer: Medicaid Other

## 2021-08-01 MED ORDER — POTASSIUM CHLORIDE CRYS ER 20 MEQ PO TBCR
40.0000 meq | EXTENDED_RELEASE_TABLET | Freq: Once | ORAL | Status: DC
  Filled 2021-08-01: qty 2

## 2021-08-01 MED ORDER — IOHEXOL 300 MG/ML  SOLN
100.0000 mL | Freq: Once | INTRAMUSCULAR | Status: AC | PRN
  Administered 2021-08-01: 100 mL via INTRAVENOUS

## 2021-08-01 NOTE — ED Provider Notes (Signed)
Patient care assumed from [facility], states please see her note for more detailed history.  In short, patient was sent to the ED due to new leukocytosis in the context of abdominal pain.  CT abdomen pelvis with contrast was ordered and was notable for a cystic structure with rim calcification measuring 4.1 cm in the left adnexa.  Pelvic ultrasound was ordered and is pending at time of shift change.   Surgical history notable for cholecystectomy, hysterectomy with ovaries still in place bilaterally, C-section.  Physical Exam  BP 112/71    Pulse 83    Temp 98.5 F (36.9 C) (Oral)    Resp (!) 22    SpO2 99%   Physical Exam Vitals and nursing note reviewed. Exam conducted with a chaperone present.  Constitutional:      General: She is not in acute distress.    Appearance: Normal appearance.  HENT:     Head: Normocephalic and atraumatic.  Eyes:     General: No scleral icterus.    Extraocular Movements: Extraocular movements intact.     Pupils: Pupils are equal, round, and reactive to light.  Abdominal:     Tenderness: There is abdominal tenderness in the right upper quadrant and left lower quadrant.  Skin:    Coloration: Skin is not jaundiced.  Neurological:     Mental Status: She is alert. Mental status is at baseline.     Coordination: Coordination normal.    Procedures  Procedures  ED Course / MDM    Medical Decision Making Amount and/or Complexity of Data Reviewed Radiology: ordered. ECG/medicine tests: ordered.  Risk Prescription drug management.   Labs and imaging were reviewed and interpreted by myself. Mild hypokalemia of 3.3. Leukocytosis of 15.2. Patient given oral kdur in setting of hypokalemia of 3.3 by prior provider.   Pelvic ultrasound without evidence of ovarian torsion. Notable for 3.9 CM complex left ovarian cyst with thickened, irregular internal septations. Will consult to gynecology for their recommendation - in patient evaluation vs  outpatient follow up.    Spoke with Dr. Mancel Bale with OBG. She agrees is any typical ovarian cyst.  Any time with postmenopausal women with ovarian cyst and need outpatient follow-up for additional evaluation to rule out oncologic process.  Agrees patient does not need acute or emergent work-up at this time, follow-up advised.  I appreciate her consult.  I discussed the results with the patient.  We engaged in the talk back method, she verbalized that she will follow-up with gynecology this week.  We cannot know for sure what the ovarian cyst is, although it is likely benign it needs further work-up for that you may not for sure.  She voices understanding.  She was discharged in table condition.  Gynecology referral made     Sherrill Raring, Hershal Coria 08/01/21 [place], MD 08/01/21 1537

## 2021-08-01 NOTE — ED Notes (Signed)
Oxygen tank replaced °

## 2021-08-01 NOTE — ED Notes (Signed)
Pt verbalized understanding of d/c instructions, meds and followup care. Denies questions. VSS, no distress noted. Steady gait to exit with all belongings.  ?

## 2021-08-01 NOTE — Discharge Instructions (Addendum)
As we discussed, you have an atypical left ovarian cyst.  Although we cannot know for sure what this cyst is, you will need to follow-up with gynecology for additional work-up.  It is likely benign but we cannot rule out possible cancer.  [facility] and schedule an outpatient follow-up.  3.9 cm complex left ovarian cyst with thickened, irregular  internal septations.

## 2021-08-01 NOTE — ED Provider Notes (Signed)
[facility] Provider Note   CSN: [id number] Arrival date & time: 07/31/21  1846     History  Chief Complaint  Patient presents with   Abdominal Pain    [facility] is a 56 y.o. 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  Has intermittently been for 2 weeks but has been more consistent for the last couple days with associated nausea and episodes of diarrhea.  Elevated white blood cell count in the outpatient setting therefore directed to the emergency department for further evaluation of her abdomen.  I personally reviewed this patient's medical records.  She has history of hypertension, generalized anxiety disorder, sleep apnea, prolonged QT, and fibromyalgia.  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   Does endorse mild increase in her cough recently history of cholecystectomy and hysterectomy in the past.  Does have her ovaries still with history of ovarian cysts.    HPI     Home Medications Prior to Admission medications   Medication Sig Start Date End Date Taking? Authorizing Provider  acetaminophen (TYLENOL) 500 MG tablet Take 500 mg by mouth every 6 (six) hours as needed for mild pain.    [provider]  albuterol (VENTOLIN HFA) 108 (90 Base) MCG/ACT inhaler INHALE 2 PUFFS INTO THE LUNGS EVERY 6 (SIX) HOURS AS NEEDED FOR WHEEZING OR SHORTNESS OF BREATH. 08/14/20 08/14/21  Ladell Pier, MD  allopurinol (ZYLOPRIM) 100 MG tablet Take 1 tablet by mouth daily. 05/01/21     apixaban (ELIQUIS) 5 MG TABS tablet Take 1 tablet (5 mg total) by mouth 2 (two) times daily. 02/20/21   Dagoberto Ligas, PA-C  atorvastatin (LIPITOR) 10 MG tablet Take 1 tablet by mouth every evening 05/14/21     budesonide-formoterol (SYMBICORT) 160-4.5 MCG/ACT  inhaler Inhale 2 puffs into the lungs in the morning and at bedtime. 05/23/21   Chesley Mires, MD  chlorthalidone (HYGROTON) 50 MG tablet TAKE 1 TABLET (50 MG TOTAL) BY MOUTH DAILY. 03/08/21 03/08/22  Ladell Pier, MD  chlorthalidone (HYGROTON) 50 MG tablet Take 1 tablet (50 mg total) by mouth in the morning with food 06/21/21     cholecalciferol (VITAMIN D3) 25 MCG (1000 UNIT) tablet Take 1,000 Units by mouth daily.    [provider]  diclofenac sodium (VOLTAREN) 1 % GEL Apply 2 g topically 4 (four) times daily. Patient taking differently: Apply 2 g topically 4 (four) times daily as needed (pain). 04/21/17   Thurman Coyer, DO  dicyclomine (BENTYL) 10 MG capsule Take 1-2 capsules (10-20 mg total) by mouth every 8 (eight) hours as needed for spasms. 03/12/19   Fulp, Cammie, MD  diphenhydrAMINE-PSE-APAP (TYLENOL ALLERGY SINUS NIGHT PO) Take 1 tablet by mouth every 4 (four) hours as needed (headache).    [provider]  DULoxetine (CYMBALTA) 20 MG capsule Take 1 capsule by mouth for 3 days, then 2 capsules for 7 days, then 3 capsules daily Patient not taking: Reported on 05/25/2021 05/01/21     DULoxetine (CYMBALTA) 60 MG capsule Take 1 capsule by mouth daily. 05/01/21     ferrous sulfate 325 (65 FE) MG tablet TAKE 1 TABLET (325 MG TOTAL) BY MOUTH DAILY WITH BREAKFAST. 09/05/20 09/05/21  Ladell Pier, MD  Fluticasone-Umeclidin-Vilant (TRELEGY ELLIPTA) 100-62.5-25 MCG/ACT AEPB Inhale 1 puff into the  lungs daily. 05/21/21   Chesley Mires, MD  lisinopril (ZESTRIL) 40 MG tablet TAKE 1 TABLET (40 MG TOTAL) BY MOUTH DAILY. 03/08/21 03/08/22  Ladell Pier, MD  lisinopril (ZESTRIL) 40 MG tablet Take 1 tablet (40 mg total) by mouth daily. 06/21/21     metoprolol tartrate (LOPRESSOR) 50 MG tablet Take 1 tablet by mouth twice daily with food 05/01/21     montelukast (SINGULAIR) 10 MG tablet Take 1 tablet (10 mg total) by mouth at bedtime. 01/12/21   Chesley Mires, MD  Multiple  Vitamins-Minerals (MULTIVITAMIN PO) Take 1 tablet by mouth daily.    [provider]  omeprazole (PRILOSEC) 20 MG capsule Take 1 capsule (20 mg total) by mouth daily. 02/02/20   Argentina Donovan, PA-C  potassium chloride SA (KLOR-CON M) 20 MEQ tablet Take by mouth twice daily with food 06/22/21     vitamin B-12 (CYANOCOBALAMIN) 1000 MCG tablet Take 1,000 mcg by mouth daily.    [provider]  zinc gluconate 50 MG tablet Take 50 mg by mouth daily.    [provider]      Allergies    Penicillins    Review of Systems   Review of Systems  Constitutional:  Positive for activity change, appetite change, chills and fatigue.  HENT: Negative.    Eyes: Negative.   Respiratory:  Positive for cough. Negative for shortness of breath.   Cardiovascular: Negative.   Gastrointestinal:  Positive for abdominal distention, diarrhea and nausea. Negative for abdominal pain, anal bleeding, blood in stool, constipation and vomiting.  Genitourinary: Negative.   Musculoskeletal: Negative.   Skin: Negative.   Neurological: Negative.    Physical Exam Updated Vital Signs BP 121/77    Pulse 66    Temp 98.5 F (36.9 C) (Oral)    Resp (!) 25    SpO2 100%  Physical Exam Vitals and nursing note reviewed.  Constitutional:      Appearance: She is morbidly obese. She is not toxic-appearing.  HENT:     Head: Normocephalic and atraumatic.     Mouth/Throat:     Mouth: Mucous membranes are moist.     Pharynx: Oropharynx is clear. No oropharyngeal exudate or posterior oropharyngeal erythema.  Eyes:     General: Lids are normal. Vision grossly intact.        Right eye: No discharge.        Left eye: No discharge.     Extraocular Movements: Extraocular movements intact.     Conjunctiva/sclera: Conjunctivae normal.     Pupils: Pupils are equal, round, and reactive to light.  Cardiovascular:     Rate and Rhythm: Normal rate and regular rhythm.     Pulses: Normal pulses.     Heart sounds:  Normal heart sounds. No murmur heard. Pulmonary:     Effort: Pulmonary effort is normal. No tachypnea or respiratory distress.     Breath sounds: Normal breath sounds. No wheezing or rales.     Comments: On 2 L O2 by nasal cannula at baseline Chest:     Chest wall: No mass, lacerations, deformity, swelling, tenderness, crepitus or edema.  Abdominal:     General: Bowel sounds are normal. There is no distension.     Tenderness: There is abdominal tenderness in the right upper quadrant and left lower quadrant. There is no right CVA tenderness, left CVA tenderness, guarding or rebound.  Genitourinary:    Comments: Patient declined pelvic exam, denies all vaginal symptoms. Musculoskeletal:  General: No deformity.     Cervical back: Normal range of motion and neck supple.  Skin:    General: Skin is warm and dry.     Capillary Refill: Capillary refill takes less than 2 seconds.  Neurological:     Mental Status: She is alert. Mental status is at baseline.     GCS: GCS eye subscore is 4. GCS verbal subscore is 5. GCS motor subscore is 6.     Gait: Gait is intact.  Psychiatric:        Mood and Affect: Mood normal.    ED Results / Procedures / Treatments   Labs (all labs ordered are listed, but only abnormal results are displayed) Labs Reviewed  COMPREHENSIVE METABOLIC PANEL - Abnormal; Notable for the following components:      Result Value   Potassium 3.3 (*)    Calcium 8.7 (*)    Albumin 3.2 (*)    AST 12 (*)    All other components within normal limits  CBC - Abnormal; Notable for the following components:   WBC 15.2 (*)    Hemoglobin 10.5 (*)    HCT 34.7 (*)    MCV 72.7 (*)    MCH 22.0 (*)    RDW 17.2 (*)    Platelets 434 (*)    All other components within normal limits  LIPASE, BLOOD  URINALYSIS, ROUTINE W REFLEX MICROSCOPIC  I-STAT BETA HCG BLOOD, ED ([facility], [facility], [facility] ONLY)    EKG None  Radiology DG Chest 2 View  Result Date: 08/01/2021 CLINICAL DATA:  Cough.  EXAM: CHEST - 2 VIEW COMPARISON:  03/23/2021 FINDINGS: Heart size and mediastinal contours appear within normal limits. There is no pleural effusion or interstitial edema. No airspace opacities identified. Review of the visualized osseous structures is unremarkable. IMPRESSION: No active cardiopulmonary abnormalities. Electronically Signed   By: Kerby Moors M.D.   On: 08/01/2021 06:01   CT ABDOMEN PELVIS W CONTRAST  Result Date: 08/01/2021 CLINICAL DATA:  Right lower quadrant abdominal pain. EXAM: CT ABDOMEN AND PELVIS WITH CONTRAST TECHNIQUE: Multidetector CT imaging of the abdomen and pelvis was performed using the standard protocol following bolus administration of intravenous contrast. RADIATION DOSE REDUCTION: This exam was performed according to the departmental dose-optimization program which includes automated exposure control, adjustment of the mA and/or kV according to patient size and/or use of iterative reconstruction technique. CONTRAST:  155mL OMNIPAQUE IOHEXOL 300 MG/ML  SOLN COMPARISON:  None. FINDINGS: Lower chest: No acute abnormality. Hepatobiliary: No focal liver abnormality is seen. Status post cholecystectomy. No biliary dilatation. Pancreas: Unremarkable. No pancreatic ductal dilatation or surrounding inflammatory changes. Spleen: Normal in size without focal abnormality. Adrenals/Urinary Tract: The adrenal glands are within normal limits. No renal calculus or hydronephrosis. A hypodensity is present in the right kidney measuring 1.4 cm, possible cyst. No ureteral calculus or obstructive uropathy bilaterally. The bladder is within normal limits. Stomach/Bowel: The stomach is unremarkable. No bowel obstruction, free air or pneumatosis. A few scattered diverticula are present along the colon without evidence of diverticulitis. A normal appendix is present in the right lower quadrant. No focal bowel wall thickening. Vascular/Lymphatic: No significant vascular findings are present. No  enlarged abdominal or pelvic lymph nodes. Reproductive: The uterus is not visualized on exam. There is a cystic structure with rim calcification in the left adnexa measuring 4.1 cm. Other: There is diastasis of the rectus abdominus with a small fat containing umbilical hernia. No ascites. Musculoskeletal: Mild degenerative changes in the thoracolumbar spine.  No acute osseous abnormality. IMPRESSION: 1. No acute intra-abdominal process.  Normal appendix. 2. Right renal cyst. 3. Diverticulosis without diverticulitis. 4. Cystic structure with rim calcification in the left adnexa measuring 4.1 cm. Ultrasound is recommended for further characterization on follow-up. Electronically Signed   By: Brett Fairy M.D.   On: 08/01/2021 03:52    Procedures Procedures    Medications Ordered in ED Medications  potassium chloride SA (KLOR-CON M) CR tablet 40 mEq (0 mEq Oral Hold 08/01/21 0519)  iohexol (OMNIPAQUE) 300 MG/ML solution 100 mL (100 mLs Intravenous Contrast Given 08/01/21 0343)    ED Course/ Medical Decision Making/ A&P                           Medical Decision Making 56 year old female presents with concern over right lower belly pain for the last few weeks.  History of cholecystectomy.  The differential diagnosis for RUQ in this patient without a uterus includes but is not limited to:  Cholelithiasis / choledocholithiasis / cholecystitis / cholangitis, hepatitis (eg. viral, alcoholic, toxic),liver abscess, pancreatitis, liver / pancreatic / biliary tract cancer, ischemic hepatopathy (shock liver), hepatic vein obstruction (Budd-Chiari syndrome), liver cell adenoma, peptic ulcer disease (duodenal), functional or nonulcer dyspepsia, right lower lobe pneumonia, pyelonephritis, urinary calculi,  Fitz-Hugh-Curtis syndrome (with pelvic inflammatory disease), herpes zoster, trauma or musculoskeletal pain, herniated disk, abdominal abscess, intestinal ischemia, physical or sexual abuse, Mittelschmerz, ovarian  cyst/torsion, PID, endometriosis, corpus luteum cyst, appendicitis, UTI/renal colic, IBD.   Tachypneic on intake on oxygen at baseline.  Vital signs otherwise normal.  Cardiopulmonary exam is unremarkable.  Abdominal exam with mild right upper quadrant tenderness to palpation and mild left lower quadrant tenderness to palpation without rebound or guarding.      Amount and/or Complexity of Data Reviewed Labs:     Details: CBC with leukocytosis of 15,000 and mild anemia with hemoglobin of 10.5.  CMP with mild hypokalemia of 3.3 with normal renal function.  Lipase is normal.  UA unremarkable Radiology: ordered.    Details: Chest x-ray negative for acute cardiopulmonary disease.  CT of the abdomen and pelvis was obtained from triage which revealed 4.1 cm cystic structure in the left adnexa with rim calcification. ECG/medicine tests: ordered.  Risk Prescription drug management.   [facility] is very well-appearing at this time.  Do feel she would benefit from further evaluation of left adnexal mass with pelvic ultrasound.  She is amenable to plan for this at this time.  Care of patient signed out to oncoming ED provider Sherrill Raring, PA-C at time of shift change.  All pertinent HPI, physical exam, and laboratory findings were discussed with her prior to my departure.  Disposition will likely be discharged with close outpatient gynecologic follow-up, however ultimately disposition decision will need to be made following pelvic ultrasound.  [facility] understanding of her medical evaluation and treatment plan.  Age of her questions answered to her expressed satisfaction.  She is amenable plan for ultrasound at this time.  This chart was dictated using voice recognition software, Dragon. Despite the best efforts of this provider to proofread and correct errors, errors may still occur which can change documentation meaning.   Final Clinical Impression(s) / ED Diagnoses Final diagnoses:  Abdominal pain,  left lower quadrant    Rx / DC Orders ED Discharge Orders     None         Emeline Darling, PA-C 08/01/21 9675    [place],  Grayce Sessions, MD 08/02/21 [id number]

## 2021-08-02 ENCOUNTER — Ambulatory Visit
Admission: RE | Admit: 2021-08-02 | Discharge: 2021-08-02 | Disposition: A | Discharge status: Home / Self Care | Payer: Medicaid Other | Source: Ambulatory Visit | Attending: Sports Medicine | Admitting: Sports Medicine

## 2021-08-02 ENCOUNTER — Ambulatory Visit (INDEPENDENT_AMBULATORY_CARE_PROVIDER_SITE_OTHER): Payer: Medicaid Other | Admitting: Sports Medicine

## 2021-08-02 VITALS — BP 122/70 | Ht 65.0 in | Wt 308.0 lb

## 2021-08-02 DIAGNOSIS — M25511 Pain in right shoulder: Secondary | ICD-10-CM

## 2021-08-03 NOTE — Progress Notes (Signed)
° °  Subjective:    Patient ID: [facility], female    DOB: 1966/05/14, 56 y.o.   MRN: [id number]  HPI  [facility] presents today for follow-up on right shoulder pain.  She has not really noticed any significant relief in pain after recent subacromial cortisone injection.  Pain continues to be located deep within the shoulder.  It is most noticeable when reaching away from her body or overhead.  She has noticed limited mobility as well.  Pain improves if she rests her arm/shoulder.    Review of Systems As above    Objective:   Physical Exam  Well-developed, well-nourished.  No acute distress  Right shoulder: Patient continues to have limited active and passive range of motion in all planes including external rotation and abduction.  She has full active and passive range of motion on the left.  Rotator cuff strength testing is limited by pain.  She is neurovascularly intact distally.  X-rays of the right shoulder including AP, lateral, and axillary views show no significant glenohumeral DJD.  She does have a mild amount of AC DJD.  Nothing acute.      Assessment & Plan:   Right shoulder pain likely secondary to adhesive capsulitis  I discussed treatment options including watchful waiting, further diagnostic imaging, and diagnostic/therapeutic glenohumeral cortisone injection.  She would like to proceed with the cortisone injection.  We will try to schedule this for sometime next week.  I also think she would benefit from formal physical therapy after that procedure.  This note was dictated using Dragon naturally speaking software and may contain errors in syntax, spelling, or content which have not been identified prior to signing this note.

## 2021-08-08 ENCOUNTER — Ambulatory Visit: Payer: Self-pay

## 2021-08-08 ENCOUNTER — Ambulatory Visit (INDEPENDENT_AMBULATORY_CARE_PROVIDER_SITE_OTHER): Payer: Medicaid Other | Admitting: Sports Medicine

## 2021-08-08 VITALS — BP 125/72 | Ht 65.0 in | Wt 308.0 lb

## 2021-08-08 DIAGNOSIS — M25511 Pain in right shoulder: Secondary | ICD-10-CM | POA: Diagnosis present

## 2021-08-08 MED ORDER — METHYLPREDNISOLONE ACETATE 40 MG/ML IJ SUSP
40.0000 mg | Freq: Once | INTRAMUSCULAR | Status: AC
  Administered 2021-08-08: 11:00:00 40 mg via INTRA_ARTICULAR

## 2021-08-08 NOTE — Progress Notes (Signed)
PCP: System, Provider Not In  Subjective:   HPI: Patient is a 56 y.o. female here for follow-up of right shoulder pain.  Theresa Pearson follows up for right shoulder pain.  She previously saw Dr. Micheline Chapman on 07/19/2021 and had a subacromial joint injection.  She states this did not help relieve her pain, and her pain feels deep within the shoulder.  She gets pain when she has to raise her arm above her head.  She denies any new injuries.  Does report some decreased range of motion. Right-hand dominant.   She is here for GHJ injection.  Past Medical History:  Diagnosis Date   Anemia    Ankylosing spondylitis (HCC)    Anxiety    Arthritis    Asthma    Chronic headaches    Depression    Diverticulosis    Fibromyalgia    Gallstones    GERD (gastroesophageal reflux disease)    Hypertension    Pneumonia due to COVID-19 virus 07/2020   Right leg DVT (HCC)    Tachycardia     Current Outpatient Medications on File Prior to Visit  Medication Sig Dispense Refill   acetaminophen (TYLENOL) 500 MG tablet Take 500 mg by mouth every 6 (six) hours as needed for mild pain.     albuterol (VENTOLIN HFA) 108 (90 Base) MCG/ACT inhaler INHALE 2 PUFFS INTO THE LUNGS EVERY 6 (SIX) HOURS AS NEEDED FOR WHEEZING OR SHORTNESS OF BREATH. 18 g 5   allopurinol (ZYLOPRIM) 100 MG tablet Take 1 tablet by mouth daily. 30 tablet 2   apixaban (ELIQUIS) 5 MG TABS tablet Take 1 tablet (5 mg total) by mouth 2 (two) times daily. 60 tablet 9   atorvastatin (LIPITOR) 10 MG tablet Take 1 tablet by mouth every evening 30 tablet 3   budesonide-formoterol (SYMBICORT) 160-4.5 MCG/ACT inhaler Inhale 2 puffs into the lungs in the morning and at bedtime. 6 g 4   chlorthalidone (HYGROTON) 50 MG tablet TAKE 1 TABLET (50 MG TOTAL) BY MOUTH DAILY. 90 tablet 0   chlorthalidone (HYGROTON) 50 MG tablet Take 1 tablet (50 mg total) by mouth in the morning with food 90 tablet 1   cholecalciferol (VITAMIN D3) 25 MCG (1000 UNIT) tablet Take 1,000 Units  by mouth daily.     diclofenac sodium (VOLTAREN) 1 % GEL Apply 2 g topically 4 (four) times daily. (Patient taking differently: Apply 2 g topically 4 (four) times daily as needed (pain).) 100 g 0   dicyclomine (BENTYL) 10 MG capsule Take 1-2 capsules (10-20 mg total) by mouth every 8 (eight) hours as needed for spasms. 90 capsule 5   diphenhydrAMINE-PSE-APAP (TYLENOL ALLERGY SINUS NIGHT PO) Take 1 tablet by mouth every 4 (four) hours as needed (headache).     DULoxetine (CYMBALTA) 20 MG capsule Take 1 capsule by mouth for 3 days, then 2 capsules for 7 days, then 3 capsules daily (Patient not taking: Reported on 05/25/2021) 30 capsule 0   DULoxetine (CYMBALTA) 60 MG capsule Take 1 capsule by mouth daily. 30 capsule 2   ferrous sulfate 325 (65 FE) MG tablet TAKE 1 TABLET (325 MG TOTAL) BY MOUTH DAILY WITH BREAKFAST. 100 tablet 1   Fluticasone-Umeclidin-Vilant (TRELEGY ELLIPTA) 100-62.5-25 MCG/ACT AEPB Inhale 1 puff into the lungs daily. 60 each 5   lisinopril (ZESTRIL) 40 MG tablet TAKE 1 TABLET (40 MG TOTAL) BY MOUTH DAILY. 90 tablet 0   lisinopril (ZESTRIL) 40 MG tablet Take 1 tablet (40 mg total) by mouth daily. 90 tablet 1  metoprolol tartrate (LOPRESSOR) 50 MG tablet Take 1 tablet by mouth twice daily with food 60 tablet 2   montelukast (SINGULAIR) 10 MG tablet Take 1 tablet (10 mg total) by mouth at bedtime. 30 tablet 5   Multiple Vitamins-Minerals (MULTIVITAMIN PO) Take 1 tablet by mouth daily.     omeprazole (PRILOSEC) 20 MG capsule Take 1 capsule (20 mg total) by mouth daily. 90 capsule 3   potassium chloride SA (KLOR-CON M) 20 MEQ tablet Take by mouth twice daily with food 60 tablet 2   vitamin B-12 (CYANOCOBALAMIN) 1000 MCG tablet Take 1,000 mcg by mouth daily.     zinc gluconate 50 MG tablet Take 50 mg by mouth daily.     No current facility-administered medications on file prior to visit.    Past Surgical History:  Procedure Laterality Date   ABDOMINAL HYSTERECTOMY  2008   for   fibroids    CESAREAN SECTION     CHOLECYSTECTOMY     THROMBECTOMY W/ EMBOLECTOMY Right 08/23/2020   Procedure: Right Upper Extremity Thrombectomy;  Surgeon: Marty Heck, MD;  Location: [facility];  Service: Vascular;  Laterality: Right;    Allergies  Allergen Reactions   Penicillins Shortness Of Breath and Rash    BP 125/72    Ht 5\' 5"  (1.651 m)    Wt (!) 308 lb (139.7 kg)    BMI 51.25 kg/m   No flowsheet data found.  No flowsheet data found.      Objective:  Physical Exam:  Gen: Well-appearing, in no acute distress; non-toxic CV: Regular Rate. Well-perfused. Warm.  Resp: Breathing unlabored on room air; no wheezing. Psych: Fluid speech in conversation; appropriate affect; normal thought process Neuro: Sensation intact throughout. No gross coordination deficits.  MSK:  - Right shoulder: + TTP over posterior-lateral shoulder. Limited active and passive Abd and forward flexion. Mildly diminished ext rotation, worse than contralateral left arm.    Assessment & Plan:  1. Chronic right shoulder pain 2. Adhesive capsulitis, right   Procedure: Right glenohumeral joint injection After informed written consent timeout was performed, patient was sitting in chair within examination room. The patient's right shoulder was prepped with Betadine and alcohol swabs and utilizing ultrasound guidance in a transverse-oblique plane, the patient's glenohumeral space was injected with 4:1 lidocaine: depomedrol via in-plane technique. Patient tolerated the procedure well with mild pain and without immediate complications.  Plan:  -Ultrasound-guided glenohumeral joint injection today -Patient may ice and/or use Tylenol for any postinjection pain -Discussed over the next 1-2 days as her pain improves to begin gentle range of motion exercises -Dr. Micheline Chapman and myself will check in in a few days-1 week to see how she is improving and consider formal physical therapy versus HEP   Theresa Barman,  DO PGY-4, Sports Medicine Fellow [place]  Addendum:  Patient seen in the office by fellow.  His history, exam, plan of care were precepted with me.  Present for and supervised injection above. Karlton Lemon MD Theresa Pearson

## 2021-08-21 ENCOUNTER — Ambulatory Visit (INDEPENDENT_AMBULATORY_CARE_PROVIDER_SITE_OTHER): Payer: Medicaid Other | Admitting: Sports Medicine

## 2021-08-21 ENCOUNTER — Other Ambulatory Visit: Payer: Self-pay

## 2021-08-21 VITALS — BP 127/64 | Ht 65.0 in | Wt 308.0 lb

## 2021-08-21 DIAGNOSIS — M25511 Pain in right shoulder: Secondary | ICD-10-CM | POA: Diagnosis present

## 2021-08-21 MED ORDER — GABAPENTIN 300 MG PO CAPS
ORAL_CAPSULE | ORAL | 1 refills | Status: AC
  Filled 2021-08-21: qty 60, 30d supply, fill #0
  Filled 2021-11-01: qty 60, 30d supply, fill #1

## 2021-08-21 NOTE — Progress Notes (Signed)
° °  Subjective:    Patient ID: Theresa Pearson, female    DOB: 24-May-1966, 56 y.o.   MRN: [id number]  HPI  Maudie Mercury presents today with persistent right shoulder pain.  She has had both a subacromial cortisone injection as well as an ultrasound-guided glenohumeral injection.  Unfortunately neither injection provided her with much symptom relief.  She continues to endorse lateral shoulder pain and limited range of motion.  She gets catching in the shoulder as well.  Previous x-ray showed some mild arthritic change but not severe.  She did notice that taking 300 mg of her mom's gabapentin was helpful.  She has had pain now for several weeks.    Review of Systems As above    Objective:   Physical Exam  Well-developed, well-nourished.  No acute distress  Right shoulder: There is limited active and passive range of motion especially with external rotation.  Passive external rotation also causes pain.  Rotator cuff strength testing is limited by pain.  Neurovascularly intact distally.      Assessment & Plan:   Persistent right shoulder pain likely secondary to adhesive capsulitis-rule out rotator cuff tear  Given the fact that she is still having pain despite both a recent subacromial cortisone injection and a glenohumeral injection, I would like to get an MRI scan specifically to rule out a rotator cuff tear.  Phone follow-up with those results when available.  If no rotator cuff tear is seen then we will likely proceed with treatment for adhesive capsulitis.  In fact, since she did notice pain relief with a dose of her mom's gabapentin, I will go ahead and start her on 300 mg gabapentin to be taken nightly for 3 nights after which she will increase to 300 mg twice daily.  This note was dictated using Dragon naturally speaking software and may contain errors in syntax, spelling, or content which have not been identified prior to signing this note.

## 2021-08-24 ENCOUNTER — Other Ambulatory Visit: Payer: Self-pay

## 2021-08-24 ENCOUNTER — Ambulatory Visit: Payer: Medicaid Other | Admitting: Pulmonary Disease

## 2021-08-24 ENCOUNTER — Encounter: Payer: Self-pay | Admitting: Pulmonary Disease

## 2021-08-24 VITALS — BP 110/78 | HR 86 | Temp 98.8°F | Ht 65.0 in | Wt 306.0 lb

## 2021-08-24 DIAGNOSIS — E662 Morbid (severe) obesity with alveolar hypoventilation: Secondary | ICD-10-CM | POA: Diagnosis not present

## 2021-08-24 DIAGNOSIS — J4550 Severe persistent asthma, uncomplicated: Secondary | ICD-10-CM | POA: Diagnosis not present

## 2021-08-24 DIAGNOSIS — R0609 Other forms of dyspnea: Secondary | ICD-10-CM

## 2021-08-24 DIAGNOSIS — Z8616 Personal history of COVID-19: Secondary | ICD-10-CM

## 2021-08-24 NOTE — Patient Instructions (Signed)
Will arrange for overnight oxygen test and call you with results  Follow up in 6 months

## 2021-08-24 NOTE — Progress Notes (Signed)
[facility]  Chief Complaint  Patient presents with   Follow-up    doe    Past Surgical History:  She  has a past surgical history that includes Cesarean section; Cholecystectomy; Abdominal hysterectomy (2008); and Thrombectomy w/ embolectomy (Right, 08/23/2020).  Past Medical History:  Anemia, Anxiety, OA, Headaches, Depression, Diverticulosis, Fibromyalgia, Gallstones, GERD, HTN, Pneumonia, Rt leg DVT 1994, Asthma, COVID 19 pneumonia January 2022, Ankylosing spondylitis  Constitutional:  BP 110/78 (BP Location: Left Arm, Cuff Size: Large)    Pulse 86    Temp 98.8 F (37.1 C) (Oral)    Ht 5\' 5"  (1.651 m)    Wt (!) 306 lb (138.8 kg)    SpO2 94% Comment: room air   BMI 50.92 kg/m   Brief Summary:  Theresa Pearson is a 56 y.o. female with hypoxia after having COVID 19 pneumonia in January 2022.      Subjective:   She feels that symbicort has helped.  Still has cough with clear to yellow sputum.  Using albuterol few times per week.  Not having any sinus issues.  Feels weak.  She is frustrated about not being able to get a POC.  Ambulatory oximetry on room air today she dropped to 87%.  She was recovered to > 92% on 2 liters.  Physical Exam:   Appearance - well kempt   ENMT - no sinus tenderness, no oral exudate, no LAN, Mallampati 3 airway, no stridor  Respiratory - equal breath sounds bilaterally, no wheezing or rales  CV - s1s2 regular rate and rhythm, no murmurs  Ext - no clubbing, no edema  Skin - no rashes  Psych - normal mood and affect       Pulmonary testing:  Spirometry 01/06/21 >> FEV1 1.95 (83%), FEV1% 85 PFT 05/21/21 >> FEV1 2.27 (97%), FEV1% 92, TLC 3.65 (70%), DLCO 101%  Chest Imaging:  CT chest 08/08/20 >> peripheral predominant GGO typical of COVID 19 pneumonia CT angio chest 08/30/20 >> ATX at bases  Sleep Tests:  PSG 10/31/15 >> AHI 2.5, SpO2 low 82% PSG 02/20/21 >> AHI 0.3, SpO2 low 95%.  Used 2 liters  O2.  Cardiac Tests:  Echo bubble 08/24/20 >> EF 65 to 70%, mild RA dilation, no shunt  Social History:  She  reports that she has never smoked. She has never used smokeless tobacco. She reports current alcohol use of about 1.0 - 2.0 standard drink per week. She reports that she does not use drugs.  Family History:  Her family history includes Allergy (severe) in her sister and son; CAD in her mother; Colon polyps in her mother and sister; Crohn's disease in her mother; Diabetes in her maternal grandmother; Diabetes Mellitus II in her mother; Heart attack in her maternal grandmother; Hypertension in her maternal grandmother; Prostate cancer in her maternal grandfather; Stroke in her mother.     Assessment/Plan:   Severe, persistent asthma. - continue symbicort 160 two puffs bid and singulair 10 mg qhs - prn albuterol  Obesity hypoventilation syndrome. - continue 2 liters oxygen with exertion - will arrange for overnight oximetry on room air to determine if she still needs supplemental oxygen at night  Dyspnea on exertion, palpitations. - she has appointment with cardiology scheduled for 08/30/21  Time Spent Involved in Patient Care on Day of Examination:  37 minutes  Follow up:   Patient Instructions  Will arrange for overnight oxygen test and call you with results  Follow up in  6 months  Medication List:   Allergies as of 08/24/2021       Reactions   Penicillins Shortness Of Breath, Rash        Medication List        Accurate as of August 24, 2021  2:20 PM. If you have any questions, ask your nurse or doctor.          STOP taking these medications    Trelegy Ellipta 100-62.5-25 MCG/ACT Aepb Generic drug: Fluticasone-Umeclidin-Vilant Stopped by: Chesley Mires, MD       TAKE these medications    acetaminophen 500 MG tablet Commonly known as: TYLENOL Take 500 mg by mouth every 6 (six) hours as needed for mild pain.   allopurinol 100 MG tablet Commonly  known as: ZYLOPRIM Take 1 tablet by mouth daily.   atorvastatin 10 MG tablet Commonly known as: LIPITOR Take 1 tablet by mouth every evening   chlorthalidone 50 MG tablet Commonly known as: HYGROTON TAKE 1 TABLET (50 MG TOTAL) BY MOUTH DAILY.   chlorthalidone 50 MG tablet Commonly known as: HYGROTON Take 1 tablet (50 mg total) by mouth in the morning with food   cholecalciferol 25 MCG (1000 UNIT) tablet Commonly known as: VITAMIN D3 Take 1,000 Units by mouth daily.   diclofenac sodium 1 % Gel Commonly known as: Voltaren Apply 2 g topically 4 (four) times daily. What changed:  when to take this reasons to take this   dicyclomine 10 MG capsule Commonly known as: BENTYL Take 1-2 capsules (10-20 mg total) by mouth every 8 (eight) hours as needed for spasms.   DULoxetine 20 MG capsule Commonly known as: CYMBALTA Take 1 capsule by mouth for 3 days, then 2 capsules for 7 days, then 3 capsules daily   DULoxetine 60 MG capsule Commonly known as: CYMBALTA Take 1 capsule by mouth daily.   Eliquis 5 MG Tabs tablet Generic drug: apixaban Take 1 tablet (5 mg total) by mouth 2 (two) times daily.   FeroSul 325 (65 FE) MG tablet Generic drug: ferrous sulfate TAKE 1 TABLET (325 MG TOTAL) BY MOUTH DAILY WITH BREAKFAST.   gabapentin 300 MG capsule Commonly known as: NEURONTIN Take 1 capsule (300 mg total) by mouth at bedtime for 3 nights. Then increase to 1 capsule two times daily.   lisinopril 40 MG tablet Commonly known as: ZESTRIL TAKE 1 TABLET (40 MG TOTAL) BY MOUTH DAILY.   lisinopril 40 MG tablet Commonly known as: ZESTRIL Take 1 tablet (40 mg total) by mouth daily.   metoprolol tartrate 50 MG tablet Commonly known as: LOPRESSOR Take 1 tablet by mouth twice daily with food   montelukast 10 MG tablet Commonly known as: SINGULAIR Take 1 tablet (10 mg total) by mouth at bedtime.   MULTIVITAMIN PO Take 1 tablet by mouth daily.   omeprazole 20 MG capsule Commonly  known as: PRILOSEC Take 1 capsule (20 mg total) by mouth daily.   potassium chloride SA 20 MEQ tablet Commonly known as: KLOR-CON M Take by mouth twice daily with food   Symbicort 160-4.5 MCG/ACT inhaler Generic drug: budesonide-formoterol Inhale 2 puffs into the lungs in the morning and at bedtime.   TYLENOL ALLERGY SINUS NIGHT PO Take 1 tablet by mouth every 4 (four) hours as needed (headache).   Ventolin HFA 108 (90 Base) MCG/ACT inhaler Generic drug: albuterol INHALE 2 PUFFS INTO THE LUNGS EVERY 6 (SIX) HOURS AS NEEDED FOR WHEEZING OR SHORTNESS OF BREATH.   vitamin B-12 1000 MCG tablet Commonly  known as: CYANOCOBALAMIN Take 1,000 mcg by mouth daily.   zinc gluconate 50 MG tablet Take 50 mg by mouth daily.        Signature:  Chesley Mires, MD [place] Pager - [phone] 08/24/2021, 2:20 PM

## 2021-08-26 ENCOUNTER — Emergency Department (HOSPITAL_COMMUNITY): Payer: Medicaid Other

## 2021-08-26 ENCOUNTER — Other Ambulatory Visit: Payer: Self-pay

## 2021-08-26 ENCOUNTER — Encounter (HOSPITAL_COMMUNITY): Payer: Self-pay | Admitting: Emergency Medicine

## 2021-08-26 ENCOUNTER — Emergency Department (HOSPITAL_COMMUNITY)
Admission: EM | Admit: 2021-08-26 | Discharge: 2021-08-27 | Disposition: A | Discharge status: Home / Self Care | Payer: Medicaid Other | Attending: Emergency Medicine | Admitting: Emergency Medicine

## 2021-08-26 DIAGNOSIS — R110 Nausea: Secondary | ICD-10-CM | POA: Insufficient documentation

## 2021-08-26 DIAGNOSIS — R000 Tachycardia, unspecified: Secondary | ICD-10-CM | POA: Diagnosis not present

## 2021-08-26 DIAGNOSIS — R0789 Other chest pain: Secondary | ICD-10-CM | POA: Diagnosis not present

## 2021-08-26 DIAGNOSIS — R5383 Other fatigue: Secondary | ICD-10-CM | POA: Diagnosis not present

## 2021-08-26 DIAGNOSIS — R42 Dizziness and giddiness: Secondary | ICD-10-CM | POA: Diagnosis not present

## 2021-08-26 NOTE — ED Provider Notes (Signed)
[facility] EMERGENCY DEPARTMENT Provider Note   CSN: [id number] Arrival date & time: 08/26/21  2303     History  Chief Complaint  Patient presents with   Chest Pain    Theresa Pearson is a 56 y.o. female who presents the emergency department with a chief complaint of dizziness and racing heart.  Patient has a history of tachycardia.  She sees cardiology and takes a beta-blocker for racing heart.  She states that she has had intermittent nausea over the past few days.  She awoke this morning feeling "bad" meaning fatigued and nauseous.  She took a nap around 2:30 PM woke up at 5 and began cooking.  She had intermittent episodes of feeling dizzy and having to steady herself.  She sat down and noticed that her heart was racing.  She put her pulse ox monitor on it and saw that her heart rate was about 114 her oxygen saturation was 94%.  She began having pain in her chest radiating to her back and the right side of her neck.  She denies any vomiting but has had persistent nausea.  She denies diaphoresis.  She has a history of hypercholesterolemia, prediabetes and mother with a history of MI /CAD.  The history is provided by the patient. No language interpreter was used.  Chest Pain     Home Medications Prior to Admission medications   Medication Sig Start Date End Date Taking? Authorizing Provider  acetaminophen (TYLENOL) 500 MG tablet Take 500 mg by mouth every 6 (six) hours as needed for mild pain. Patient not taking: Reported on 08/24/2021    [provider]  albuterol (VENTOLIN HFA) 108 (90 Base) MCG/ACT inhaler INHALE 2 PUFFS INTO THE LUNGS EVERY 6 (SIX) HOURS AS NEEDED FOR WHEEZING OR SHORTNESS OF BREATH. 08/14/20 08/14/21  Ladell Pier, MD  allopurinol (ZYLOPRIM) 100 MG tablet Take 1 tablet by mouth daily. 05/01/21     apixaban (ELIQUIS) 5 MG TABS tablet Take 1 tablet (5 mg total) by mouth 2 (two) times daily. 02/20/21   Dagoberto Ligas, PA-C  atorvastatin  (LIPITOR) 10 MG tablet Take 1 tablet by mouth every evening 05/14/21     budesonide-formoterol (SYMBICORT) 160-4.5 MCG/ACT inhaler Inhale 2 puffs into the lungs in the morning and at bedtime. 05/23/21   Chesley Mires, MD  chlorthalidone (HYGROTON) 50 MG tablet TAKE 1 TABLET (50 MG TOTAL) BY MOUTH DAILY. 03/08/21 03/08/22  Ladell Pier, MD  chlorthalidone (HYGROTON) 50 MG tablet Take 1 tablet (50 mg total) by mouth in the morning with food Patient not taking: Reported on 08/24/2021 06/21/21     cholecalciferol (VITAMIN D3) 25 MCG (1000 UNIT) tablet Take 1,000 Units by mouth daily. Patient not taking: Reported on 08/24/2021    [provider]  diclofenac sodium (VOLTAREN) 1 % GEL Apply 2 g topically 4 (four) times daily. Patient taking differently: Apply 2 g topically 4 (four) times daily as needed (pain). 04/21/17   Thurman Coyer, DO  dicyclomine (BENTYL) 10 MG capsule Take 1-2 capsules (10-20 mg total) by mouth every 8 (eight) hours as needed for spasms. 03/12/19   Fulp, Cammie, MD  diphenhydrAMINE-PSE-APAP (TYLENOL ALLERGY SINUS NIGHT PO) Take 1 tablet by mouth every 4 (four) hours as needed (headache). Patient not taking: Reported on 08/24/2021    [provider]  DULoxetine (CYMBALTA) 20 MG capsule Take 1 capsule by mouth for 3 days, then 2 capsules for 7 days, then 3 capsules daily Patient not taking: Reported  on 05/25/2021 05/01/21     DULoxetine (CYMBALTA) 60 MG capsule Take 1 capsule by mouth daily. 05/01/21     ferrous sulfate 325 (65 FE) MG tablet TAKE 1 TABLET (325 MG TOTAL) BY MOUTH DAILY WITH BREAKFAST. 09/05/20 09/05/21  Ladell Pier, MD  gabapentin (NEURONTIN) 300 MG capsule Take 1 capsule (300 mg total) by mouth at bedtime for 3 nights. Then increase to 1 capsule two times daily. 08/21/21   Lilia Argue R, DO  lisinopril (ZESTRIL) 40 MG tablet TAKE 1 TABLET (40 MG TOTAL) BY MOUTH DAILY. 03/08/21 03/08/22  Ladell Pier, MD  lisinopril (ZESTRIL) 40 MG tablet  Take 1 tablet (40 mg total) by mouth daily. Patient not taking: Reported on 08/24/2021 06/21/21     metoprolol tartrate (LOPRESSOR) 50 MG tablet Take 1 tablet by mouth twice daily with food 05/01/21     montelukast (SINGULAIR) 10 MG tablet Take 1 tablet (10 mg total) by mouth at bedtime. 01/12/21   Chesley Mires, MD  Multiple Vitamins-Minerals (MULTIVITAMIN PO) Take 1 tablet by mouth daily.    [provider]  omeprazole (PRILOSEC) 20 MG capsule Take 1 capsule (20 mg total) by mouth daily. 02/02/20   Argentina Donovan, PA-C  potassium chloride SA (KLOR-CON M) 20 MEQ tablet Take by mouth twice daily with food 06/22/21     vitamin B-12 (CYANOCOBALAMIN) 1000 MCG tablet Take 1,000 mcg by mouth daily.    [provider]  zinc gluconate 50 MG tablet Take 50 mg by mouth daily.    [provider]      Allergies    Penicillins    Review of Systems   Review of Systems  Cardiovascular:  Positive for chest pain.   Physical Exam Updated Vital Signs BP (!) 141/96    Pulse (!) 108    Temp 98.4 F (36.9 C) (Oral)    Resp 18    Ht 5\' 5"  (1.651 m)    Wt (!) 156 kg    SpO2 99%    BMI 57.23 kg/m  Physical Exam Vitals and nursing note reviewed.  Constitutional:      General: She is not in acute distress.    Appearance: She is well-developed. She is not diaphoretic.  HENT:     Head: Normocephalic and atraumatic.     Right Ear: External ear normal.     Left Ear: External ear normal.     Nose: Nose normal.     Mouth/Throat:     Mouth: Mucous membranes are moist.  Eyes:     General: No scleral icterus.    Conjunctiva/sclera: Conjunctivae normal.  Cardiovascular:     Rate and Rhythm: Regular rhythm. Tachycardia present.     Heart sounds: Normal heart sounds. No murmur heard.   No friction rub. No gallop.  Pulmonary:     Effort: Pulmonary effort is normal. No respiratory distress.     Breath sounds: Normal breath sounds.  Abdominal:     General: Bowel sounds are normal. There  is no distension.     Palpations: Abdomen is soft. There is no mass.     Tenderness: There is no abdominal tenderness. There is no guarding.  Musculoskeletal:     Cervical back: Normal range of motion.  Skin:    General: Skin is warm and dry.  Neurological:     Mental Status: She is alert and oriented to person, place, and time.  Psychiatric:        Behavior: Behavior normal.  ED Results / Procedures / Treatments   Labs (all labs ordered are listed, but only abnormal results are displayed) Labs Reviewed  BASIC METABOLIC PANEL  CBC  PROTIME-INR  TROPONIN I (HIGH SENSITIVITY)    EKG None  Radiology DG Chest 2 View  Result Date: 08/26/2021 CLINICAL DATA:  Chest pain EXAM: CHEST - 2 VIEW COMPARISON:  08/01/2021 FINDINGS: Heart is normal size. Linear atelectasis at the left base. Right lung clear. No effusions or acute bony abnormality. IMPRESSION: No active cardiopulmonary disease. Electronically Signed   By: Rolm Baptise M.D.   On: 08/26/2021 23:36    Procedures Procedures    Medications Ordered in ED Medications - No data to display  ED Course/ Medical Decision Making/ A&P Clinical Course as of 08/27/21 0334  Mon Aug 27, 2021  0012 DG Chest 2 View No acute findings on cxr  [AH]  0223 TSH: 4.452 Tsh wnl  [AH]  0224 Troponin I (High Sensitivity): 9 Troponin  [AH]  0228 Potassium(!): 3.1 Appears to be baseline for the patient [AH]  0228 Glucose(!): 116 [AH]  0228 Creatinine(!): 1.02 [AH]  0229 Creatinine(!): 1.02 Creatinine slitghtly elevated will give fluids [AH]  0230 Orthtostatics negative  [AH]  0255 EKG 12-Lead EKG shows sinus tachycardia at a rate of 103 [AH]    Clinical Course User Index [AH] Margarita Mail, PA-C                           Medical Decision Making Given the large differential diagnosis for Theresa Pearson, the decision making in this case is of high complexity.  After evaluating all of the data points in this case, the presentation  of Theresa Pearson is NOT consistent with Acute Coronary Syndrome (ACS) and/or myocardial ischemia, pulmonary embolism, aortic dissection; Theresa Pearson, significant arrythmia, pneumothorax, cardiac tamponade, or other emergent cardiopulmonary condition.  Further, the presentation of Theresa Pearson is NOT consistent with pericarditis, myocarditis, cholecystitis, pancreatitis, mediastinitis, endocarditis, new valvular disease.  Additionally, the presentation of Theresa Pearson NOT consistent with flail chest, cardiac contusion, ARDS, or significant intra-thoracic or intra-abdominal bleeding.  Moreover, this presentation is NOT consistent with pneumonia, sepsis, or pyelonephritis.    Strict return and follow-up precautions have been given by me personally or by detailed written instruction given verbally by nursing staff using the teach back method to the patient/family/caregiver(s).  Data Reviewed/Counseling: I have reviewed the patient's vital signs, nursing notes, and other relevant tests/information. I had a detailed discussion regarding the historical points, exam findings, and any diagnostic results supporting the discharge diagnosis. I also discussed the need for outpatient follow-up and the need to return to the ED if symptoms worsen or if there are any questions or concerns that arise at home.    Amount and/or Complexity of Data Reviewed Labs: ordered. Decision-making details documented in ED Course. Radiology: ordered and independent interpretation performed. Decision-making details documented in ED Course. ECG/medicine tests: ordered and independent interpretation performed. Decision-making details documented in ED Course.    Final Clinical Impression(s) / ED Diagnoses Final diagnoses:  None    Rx / DC Orders ED Discharge Orders     None         Margarita Mail, PA-C 08/27/21 0335    Orpah Greek, MD 08/28/21 [phone]

## 2021-08-26 NOTE — ED Triage Notes (Signed)
Patient arrived with EMS from home reports central chest pressure radiating to right and left neck and upper back this evening with mild SOB , no emesis or diaphoresis . Denies cough or fever .

## 2021-08-27 LAB — PROTIME-INR
INR: 1 (ref 0.8–1.2)
Prothrombin Time: 13.3 seconds (ref 11.4–15.2)

## 2021-08-27 LAB — BASIC METABOLIC PANEL
Anion gap: 10 (ref 5–15)
BUN: 20 mg/dL (ref 6–20)
CO2: 30 mmol/L (ref 22–32)
Calcium: 9 mg/dL (ref 8.9–10.3)
Chloride: 98 mmol/L (ref 98–111)
Creatinine, Ser: 1.02 mg/dL — ABNORMAL HIGH (ref 0.44–1.00)
GFR, Estimated: >60 mL/min (ref >60)
Glucose, Bld: 116 mg/dL — ABNORMAL HIGH (ref 70–99)
Potassium: 3.1 mmol/L — ABNORMAL LOW (ref 3.5–5.1)
Sodium: 138 mmol/L (ref 135–145)

## 2021-08-27 LAB — CBC
HCT: 40.1 % (ref 36.0–46.0)
Hemoglobin: 12.5 g/dL (ref 12.0–15.0)
MCH: 22.4 pg — ABNORMAL LOW (ref 26.0–34.0)
MCHC: 31.2 g/dL (ref 30.0–36.0)
MCV: 71.7 fL — ABNORMAL LOW (ref 80.0–100.0)
Platelets: 359 10*3/uL (ref 150–400)
RBC: 5.59 MIL/uL — ABNORMAL HIGH (ref 3.87–5.11)
RDW: 17.4 % — ABNORMAL HIGH (ref 11.5–15.5)
WBC: 13.5 10*3/uL — ABNORMAL HIGH (ref 4.0–10.5)
nRBC: 0 % (ref 0.0–0.2)

## 2021-08-27 LAB — TROPONIN I (HIGH SENSITIVITY)
Troponin I (High Sensitivity): 9 ng/L (ref <18)
Troponin I (High Sensitivity): 11 ng/L (ref <18)

## 2021-08-27 LAB — TSH: TSH: 4.452 u[IU]/mL (ref 0.350–4.500)

## 2021-08-27 MED ORDER — SODIUM CHLORIDE 0.9 % IV BOLUS
1000.0000 mL | Freq: Once | INTRAVENOUS | Status: AC
  Administered 2021-08-27: 1000 mL via INTRAVENOUS

## 2021-08-27 NOTE — ED Notes (Signed)
IV team consult ordered due to multiple unsuccessful venipunctures .

## 2021-08-27 NOTE — Discharge Instructions (Signed)

## 2021-08-28 ENCOUNTER — Other Ambulatory Visit (HOSPITAL_COMMUNITY): Payer: Self-pay

## 2021-08-28 ENCOUNTER — Other Ambulatory Visit: Payer: Self-pay | Admitting: Pulmonary Disease

## 2021-08-28 ENCOUNTER — Encounter (INDEPENDENT_AMBULATORY_CARE_PROVIDER_SITE_OTHER): Payer: Self-pay | Admitting: Sports Medicine

## 2021-08-28 ENCOUNTER — Ambulatory Visit (INDEPENDENT_AMBULATORY_CARE_PROVIDER_SITE_OTHER): Payer: Commercial Managed Care - POS | Admitting: Sports Medicine

## 2021-08-28 VITALS — BP 97/68 | HR 75

## 2021-08-28 DIAGNOSIS — M7711 Lateral epicondylitis, right elbow: Secondary | ICD-10-CM

## 2021-08-28 MED ORDER — ALLOPURINOL 100 MG PO TABS
ORAL_TABLET | ORAL | 2 refills | Status: DC
  Filled 2021-08-28: qty 30, 30d supply, fill #0
  Filled 2021-10-02: qty 30, 30d supply, fill #1
  Filled 2021-11-01 – 2021-12-19 (×2): qty 30, 30d supply, fill #2

## 2021-08-28 MED ORDER — DULOXETINE HCL 60 MG PO CPEP
ORAL_CAPSULE | ORAL | 2 refills | Status: DC
  Filled 2021-08-28: qty 30, 30d supply, fill #0
  Filled 2021-10-02: qty 30, 30d supply, fill #1
  Filled 2021-11-01: qty 30, 30d supply, fill #2

## 2021-08-28 MED ORDER — MONTELUKAST SODIUM 10 MG PO TABS
10.0000 mg | ORAL_TABLET | Freq: Every day | ORAL | 5 refills | Status: AC
  Filled 2021-08-28: qty 30, 30d supply, fill #0
  Filled 2021-10-02: qty 30, 30d supply, fill #1
  Filled 2021-11-01: qty 30, 30d supply, fill #2
  Filled 2022-01-22: qty 30, 30d supply, fill #3
  Filled 2022-03-03: qty 30, 30d supply, fill #4
  Filled 2022-05-17: qty 30, 30d supply, fill #5

## 2021-08-28 NOTE — Progress Notes (Signed)
[facility]    Provider: Schuyler Amor, DO  Date of Exam:  08/28/2021   Patient:  Elizabeth Dougherty  DOB:  [birth year]    AGE:  56 y.o.  MR#:  [id number]     Chief Complaint: Right elbow pain.    HPI:  Elizabeth Dougherty is a pleasant 56 y.o.-year-old RHD female who presents today with a history of right elbow pain that she has had for the last 1 month, no injury. She localizes the pain to the lateral elbow. She reports her symptoms are made worse with lifting and gripping motions.  She denies swelling and stiffness post activities.  Denies numbness/tingling.  She has no significant history of prior injury.  No pain meds.  No PT yet. Elizabeth Dougherty is now here for further evaluation and discussion of treatment options.    For other past medical history, social history, family history and past surgical history please see them listed below.    Problem List: There is no problem list on file for this patient.       Past Medical History:    Past Medical History:   Diagnosis Date    Abdominal hernia 2008    Abnormal Pap smear     Genital warts     Hemorrhoids without complication        Social History:   Social History     Tobacco Use    Smoking status: Never    Smokeless tobacco: Never   Substance Use Topics    Alcohol use: Yes     Alcohol/week: 1.0 standard drink     Types: 1 Glasses of wine per week     Comment: occasional    Drug use: Never       Family History: History reviewed. No pertinent family history.    Past Surgical History:    Past Surgical History:   Procedure Laterality Date    BREAST BIOPSY      CERVICAL BIOPSY  W/ LOOP ELECTRODE EXCISION         Medications:      Current Outpatient Medications:     Multiple Vitamin (MULTI-VITAMIN DAILY PO), Multi Vitamin  1 po qd, Disp: , Rfl:        Allergies:  No Known Allergies    ROS:    Constitutional: No fatigue, fever, weight loss, or weight gain.   Ears, Nose, Mouth & Throat: No sore throat or hearing loss.   Cardiovascular: No chest pain,  blood clots, or leg cramps.   Respiratory: No shortness of breath, cough, or difficulty breathing.   Gastrointestinal: No nausea, vomiting, diarrhea, or loss of appetite.   Genitourinary: No polyuria or kidney disease.   Musculoskeletal: No joint aches, muscle weakness or swelling of joints/body parts other than that mentioned above.  Integumentary: No finger nail changes or skin dryness.   Neurological: No numbness, burning discomfort, or headaches.   Psychiatric: No depression or anxiety.   Endocrine: No increased thirst, change in appetite or thyroid disease.   Hematologic/Lymphatic: No easy bruising or anemia.     EXAM:   Vitals reviewed.  Constitutional: Pt is well-developed, well-nourished, and in no distress.   HENT:   Head: Normocephalic and atraumatic.   Eyes: Conjunctivae are normal.   Pulmonary/Chest: Effort normal.   Neurological: Pt is alert and oriented to person, place, and time.   Skin: Skin is warm and dry. No rash noted. Pt is not diaphoretic.   Psychiatric: Affect normal.  Right Elbow  Observation: abnormal; no swelling, deformity or ecchymosis  Palpation: tender over lateral epicondyle at extensor tendon insertion, no tenderness at the medial epicondyle, olecranon bursa  ROM: Flexion 135 Deg  ;  Extension 0 Deg  ;  Supination 90 Deg  ;  Pronation 90 Deg   Strength testing:     Wrist extension - 5 / 5 with pain    Wrist flexion - 5 / 5    Finger Extension - 5 / 5 with pain    Finger Flexion - 5 / 5    Supination - 5 / 5 with pain    Pronation - 5 / 5  UCL laxity: negative  Distal Sensory: normal  Ulnar Nerve:     Tinel's: negative;     Subluxation: negative  Distal neurological exam:  Intact      Vitals: BP 97/68   Pulse 75   LMP 11/12/2017     General: Elizabeth Dougherty was pleasant, oriented, easily engaged, displayed logical thinking with clear speech and was neat in appearance. Her general appearance was normal, well-developed and well-nourished. She was comfortable in the presence of her elbow  pain.    Gait: The patient demonstrated non antalgic gait with intact coordination and balance.     STUDIES:   No Imaging obtained today.    ASSESSMENT/PLAN:   Elizabeth Dougherty was seen today for elbow pain.    Diagnoses and all orders for this visit:    Right lateral epicondylitis  -     Wrist brace cock up volar  -     Ambulatory referral to Physical Therapy    Recommended avoiding aggravating activities, discussed activity modifications to tolerance.  May take NSAIDs prn pain as instructed with food, cautions discussed.  Provided with HEP to start.  Will see how formal PT goes.  Follow up 8 weeks, sooner prn.  We discussed injections if symptoms persist.    25 minutes were spent face-to-face with the patient, with coordination of care and counseling about disease process and expect recovery comprising > 50 percent of the visit.    1. Number and Complexity of Problems Addressed: E&MData4: Acute Complicated    2. Data Reviewed: (need A, B, or C)  A) Independent Radiology interpretation: No    B) (need 3):   New Radiology/Labs ordered: No  Outside Radiology reports/Labs reviewed: No  Involve Independent Historian: No  External provider notes reviewed: No      C) Discussion with External provider: No    3. Risk of Problem/Management: Moderate risk from additional diagnostic testing or treatment   (need 1)  Closed Fracture Care: No    Medication Prescriptions Given: No    Minor Surgery/Injections with Risk Factors Discussed: Yes discussed injections  Major Surgery without Risk Factors Discussed: No      --------------------------------------------------    Level of Medical Decision Making: Moderate - Level 4 Visit  (Need 2 of 3 categories above)      The review of the patient's medications does not in any way constitute an endorsement, by this clinician,  of their use, dosage, indications, route, efficacy, interactions, or other clinical parameters.     This note was generated within the [employer] EMR using Dragon medical speech  recognition software and may contain inherent errors or omissions not intended by the user. Grammatical and punctuation errors, random word insertions, deletions, pronoun errors and incomplete sentences are occasional consequences of this technology due to software limitations. Not all errors are caught or  corrected.  Although every attempt is made to root out erroneus and incomplete transcription, the note may still not fully represent the intent or opinion of the author. If there are questions or concerns about the content of this note or information contained within the body of this dictation they should be addressed directly with the author for clarification.

## 2021-08-29 ENCOUNTER — Ambulatory Visit: Payer: Medicaid Other | Admitting: Obstetrics and Gynecology

## 2021-08-29 ENCOUNTER — Other Ambulatory Visit: Payer: Self-pay

## 2021-08-29 ENCOUNTER — Other Ambulatory Visit (HOSPITAL_COMMUNITY)
Admission: RE | Admit: 2021-08-29 | Discharge: 2021-08-29 | Disposition: A | Discharge status: Home / Self Care | Payer: Medicaid Other | Source: Ambulatory Visit | Attending: Obstetrics and Gynecology | Admitting: Obstetrics and Gynecology

## 2021-08-29 ENCOUNTER — Encounter: Payer: Self-pay | Admitting: Obstetrics and Gynecology

## 2021-08-29 VITALS — BP 125/83 | HR 92 | Wt 306.0 lb

## 2021-08-29 DIAGNOSIS — N83209 Unspecified ovarian cyst, unspecified side: Secondary | ICD-10-CM | POA: Insufficient documentation

## 2021-08-29 DIAGNOSIS — Z01419 Encounter for gynecological examination (general) (routine) without abnormal findings: Secondary | ICD-10-CM | POA: Insufficient documentation

## 2021-08-29 DIAGNOSIS — N83202 Unspecified ovarian cyst, left side: Secondary | ICD-10-CM

## 2021-08-29 NOTE — Progress Notes (Signed)
Pt is in office for f/u ED visit.  Pt was noted to have a left ovarian cyst, recommend follow up.

## 2021-08-29 NOTE — Patient Instructions (Signed)
Ovarian Cyst An ovarian cyst is a fluid-filled sac on an ovary. Most of these cysts go away on their own and are not cancer. Some cysts need treatment. What are the causes? Ovarian hyperstimulation syndrome. Some medicines may lead to this problem. Polycystic ovarian syndrome (PCOS). Problems with body chemicals (hormones) can lead to this condition. The normal menstrual cycle. What increases the risk? Being overweight or very overweight. Taking medicines to increase your chance of getting pregnant. Using some types of birth control. Smoking. What are the signs or symptoms? Many ovarian cysts do not cause symptoms. If you get symptoms, you may have: Pain or pressure in the area between the hip bones. Pain in the lower belly. Pain during sex. Swelling in the lower belly. Periods that are not regular. Pain with periods. How is this treated? Many ovarian cysts go away on their own without treatment. If you need treatment, it may include: Medicines for pain. Fluid taken out of the cyst. The cyst being taken out. Birth control pills or other medicines. Surgery to remove the ovary. Follow these instructions at home: Take over-the-counter and prescription medicines only as told by your doctor. Ask your doctor if you should avoid driving or using machines while you are taking your medicine. Get exams and Pap tests as told by your doctor. Return to your normal activities when your doctor says that it is safe. Do not smoke or use any products that contain nicotine or tobacco. If you need help quitting, ask your doctor. Keep all follow-up visits. Contact a doctor if: Your periods: Are late. Are not regular. Stop. Are painful. You have pain in the area between your hip bones, and the pain does not go away. You feel pressure on your bladder. You have trouble peeing. You feel full, or your belly hurts, swells, or bloats. You gain or lose weight without trying, or you are less hungry than  normal. You feel pain and pressure in your back. You feel pain and pressure in the area between your hip bones. You think you may be pregnant. Get help right away if: You have pain in your belly that is very bad or gets worse. You have pain in the area between your hip bones, and the pain is very bad or gets worse. You cannot eat or drink without vomiting. You get a fever or chills all of a sudden. Your period is a lot heavier than usual. Summary An ovarian cyst is a fluid-filled sac on an ovary. Some cysts may cause problems and need treatment. Most of these cysts go away on their own. This information is not intended to replace advice given to you by your health care provider. Make sure you discuss any questions you have with your health care provider. Document Revised: 12/09/2019 Document Reviewed: 12/09/2019 Elsevier Patient Education  2022 [employer].

## 2021-08-29 NOTE — Progress Notes (Signed)
Ms Giovanetti presents for ER follow up. Seen in ER Jan 18 for RLQ  pain side. CT scan left ovarian cyst. F/U GYN U/S complex left ovarian cyst 3-4 cm  Pt with H/O Lap supracervical hyst in 2014 due to fibroids Not sexual active  Chronic medical problems as listed. Chronic anticoagulation.   Last pap uncertain  PE AF VSS Chaperone present present Lungs clear  Heart RRR Abd soft + BS obese GU NL EGBUS, cervix noted, pap smear obtained, uterus surgerical absent, no masses Exam limited by pt habitus.  A/P Left ovarian cyst   Reviewed with pt. Will check Ca 125 and f/u U/S. F/U in 6 weeks to discuss results

## 2021-08-30 ENCOUNTER — Encounter: Payer: Self-pay | Admitting: Internal Medicine

## 2021-08-30 ENCOUNTER — Ambulatory Visit: Payer: Medicaid Other | Admitting: Internal Medicine

## 2021-08-30 VITALS — BP 103/66 | HR 73 | Ht 65.0 in | Wt 306.0 lb

## 2021-08-30 DIAGNOSIS — R0600 Dyspnea, unspecified: Secondary | ICD-10-CM

## 2021-08-30 LAB — CA 125: Cancer Antigen (CA) 125: 4.8 U/mL (ref 0.0–38.1)

## 2021-08-30 NOTE — Patient Instructions (Signed)
Medication Instructions:  No Changes In Medications at this time.  *If you need a refill on your cardiac medications before your next appointment, please call your pharmacy*  Follow-Up: At [facility], you and your health needs are our priority.  As part of our continuing mission to provide you with exceptional heart care, we have created designated Provider Care Teams.  These Care Teams include your primary Cardiologist (physician) and Advanced Practice Providers (APPs -  Physician Assistants and Nurse Practitioners) who all work together to provide you with the care you need, when you need it.  Your next appointment:   6 month(s)  The format for your next appointment:   In Person  Provider:   Janina Mayo, MD

## 2021-08-30 NOTE — Progress Notes (Signed)
Cardiology Office Note:    Date:  08/30/2021   ID:  Theresa Pearson, DOB 05-27-1966, MRN [id number]  PCP:  Theresa Pearson, No   [facility] Providers Cardiologist:  Theresa Mayo, MD     Referring MD: No ref. provider found   No chief complaint on file. Fatigue  History of Present Illness:    Theresa Pearson is a 56 y.o. female with a hx of HTN, chronic fatigue syndrome, anxiety, referral for dyspnea, mild restriction on PFTs, h/o DVT in 1994,COVID infection referral for decreased stamina and palpitations, requesting to see cardiology  She was diagnosed in January with COVID. She was hospitalized. She was hypoxic. Her CT chest: showed Patchy peripheral ground-glass opacities concerning for atypical/viral pneumonia. She was treated with remedesivir and azithromycin. As well as steroids. She was managed with doxy for a total of 5 days for PNA. 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 She was started on eliquis.   Since then she is still on O2. Theresa Pearson states that her heart rate seems high with moving since that time. She tasted some wine and her heart rate went "through the roof". It happens daily. When she stands up she gets dizziness.She denies syncope. Her mother has slow heart rate, no pacemaker. She was treated with rocephin and azithromycin. Once she stopped the metoprolol she was fine. No SCD.  No cardiac history. She had stress test years ago in [facility] in [place], it was normal. No LHC. Mother has history of strokes and MI. ?Father hx, he died in [place] war.  Cardiology Studies 08/24/2020 Normal LV function No valve dx Normal LA size TR 1.8 m/s , RA pressure 15. E/e' 13 Negative bubble study  Interim Hx:  She continues on oxygen, she was seen by pulmonology on 08/24/2021 and she continues do desat on RA with ambulation to 87%. She went to the ED for chest pressure 08/26/2021 without evidence of ACS.  Cxray was unremarkable. She had sinus tachycardia. She had no evidence of acute cardiac, vascular or pulmonary pathology. She follows up today. No reports of chest pain. Has persistent dyspnea. No LE edema.   Past Medical History:  Diagnosis Date   Anemia    Ankylosing spondylitis (HCC)    Anxiety    Arthritis    Asthma    Chronic headaches    Depression    Diverticulosis    Fibromyalgia    Gallstones    GERD (gastroesophageal reflux disease)    Hypertension    Pneumonia due to COVID-19 virus 07/2020   Right leg DVT ([place])    Tachycardia     Past Surgical History:  Procedure Laterality Date   ABDOMINAL HYSTERECTOMY  2008   for  fibroids    CESAREAN SECTION     CHOLECYSTECTOMY     THROMBECTOMY W/ EMBOLECTOMY Right 08/23/2020   Procedure: Right Upper Extremity Thrombectomy;  Surgeon: Marty Heck, MD;  Location: [facility] OR;  Service: Vascular;  Laterality: Right;    Current Medications: Current Meds  Medication Sig   acetaminophen (TYLENOL) 500 MG tablet Take 500 mg by mouth every 6 (six) hours as needed for mild pain.   albuterol (VENTOLIN HFA) 108 (90 Base) MCG/ACT inhaler INHALE 2 PUFFS INTO THE LUNGS EVERY 6 (SIX) HOURS AS NEEDED FOR WHEEZING OR SHORTNESS OF BREATH.   allopurinol (ZYLOPRIM) 100 MG tablet Take 1 tablet by mouth daily.   apixaban (ELIQUIS) 5 MG TABS tablet Take  1 tablet (5 mg total) by mouth 2 (two) times daily.   atorvastatin (LIPITOR) 10 MG tablet Take 1 tablet by mouth every evening   budesonide-formoterol (SYMBICORT) 160-4.5 MCG/ACT inhaler Inhale 2 puffs into the lungs in the morning and at bedtime.   chlorthalidone (HYGROTON) 50 MG tablet TAKE 1 TABLET (50 MG TOTAL) BY MOUTH DAILY.   chlorthalidone (HYGROTON) 50 MG tablet Take 1 tablet (50 mg total) by mouth in the morning with food   cholecalciferol (VITAMIN D3) 25 MCG (1000 UNIT) tablet Take 1,000 Units by mouth daily.   diclofenac sodium (VOLTAREN) 1 % GEL Apply 2 g topically 4 (four) times daily.  (Patient taking differently: Apply 2 g topically 4 (four) times daily as needed (pain).)   dicyclomine (BENTYL) 10 MG capsule Take 1-2 capsules (10-20 mg total) by mouth every 8 (eight) hours as needed for spasms.   DULoxetine (CYMBALTA) 20 MG capsule Take 1 capsule by mouth for 3 days, then 2 capsules for 7 days, then 3 capsules daily   DULoxetine (CYMBALTA) 60 MG capsule Take 1 capsule by mouth daily.   ferrous sulfate 325 (65 FE) MG tablet TAKE 1 TABLET (325 MG TOTAL) BY MOUTH DAILY WITH BREAKFAST.   gabapentin (NEURONTIN) 300 MG capsule Take 1 capsule (300 mg total) by mouth at bedtime for 3 nights. Then increase to 1 capsule two times daily.   lisinopril (ZESTRIL) 40 MG tablet TAKE 1 TABLET (40 MG TOTAL) BY MOUTH DAILY.   lisinopril (ZESTRIL) 40 MG tablet Take 1 tablet (40 mg total) by mouth daily.   metoprolol tartrate (LOPRESSOR) 50 MG tablet Take 1 tablet by mouth twice daily with food   montelukast (SINGULAIR) 10 MG tablet Take 1 tablet (10 mg total) by mouth at bedtime.   Multiple Vitamins-Minerals (MULTIVITAMIN PO) Take 1 tablet by mouth daily.   omeprazole (PRILOSEC) 20 MG capsule Take 1 capsule (20 mg total) by mouth daily.   potassium chloride SA (KLOR-CON M) 20 MEQ tablet Take by mouth twice daily with food   vitamin B-12 (CYANOCOBALAMIN) 1000 MCG tablet Take 1,000 mcg by mouth daily.   zinc gluconate 50 MG tablet Take 50 mg by mouth daily.    Current Outpatient Medications on File Prior to Visit  Medication Sig Dispense Refill   acetaminophen (TYLENOL) 500 MG tablet Take 500 mg by mouth every 6 (six) hours as needed for mild pain.     albuterol (VENTOLIN HFA) 108 (90 Base) MCG/ACT inhaler INHALE 2 PUFFS INTO THE LUNGS EVERY 6 (SIX) HOURS AS NEEDED FOR WHEEZING OR SHORTNESS OF BREATH. 18 g 5   allopurinol (ZYLOPRIM) 100 MG tablet Take 1 tablet by mouth daily. 30 tablet 2   apixaban (ELIQUIS) 5 MG TABS tablet Take 1 tablet (5 mg total) by mouth 2 (two) times daily. 60 tablet 9    atorvastatin (LIPITOR) 10 MG tablet Take 1 tablet by mouth every evening 30 tablet 3   budesonide-formoterol (SYMBICORT) 160-4.5 MCG/ACT inhaler Inhale 2 puffs into the lungs in the morning and at bedtime. 6 g 4   chlorthalidone (HYGROTON) 50 MG tablet TAKE 1 TABLET (50 MG TOTAL) BY MOUTH DAILY. 90 tablet 0   chlorthalidone (HYGROTON) 50 MG tablet Take 1 tablet (50 mg total) by mouth in the morning with food 90 tablet 1   cholecalciferol (VITAMIN D3) 25 MCG (1000 UNIT) tablet Take 1,000 Units by mouth daily.     diclofenac sodium (VOLTAREN) 1 % GEL Apply 2 g topically 4 (four) times daily. (Patient  taking differently: Apply 2 g topically 4 (four) times daily as needed (pain).) 100 g 0   dicyclomine (BENTYL) 10 MG capsule Take 1-2 capsules (10-20 mg total) by mouth every 8 (eight) hours as needed for spasms. 90 capsule 5   DULoxetine (CYMBALTA) 20 MG capsule Take 1 capsule by mouth for 3 days, then 2 capsules for 7 days, then 3 capsules daily 30 capsule 0   DULoxetine (CYMBALTA) 60 MG capsule Take 1 capsule by mouth daily. 30 capsule 2   ferrous sulfate 325 (65 FE) MG tablet TAKE 1 TABLET (325 MG TOTAL) BY MOUTH DAILY WITH BREAKFAST. 100 tablet 1   gabapentin (NEURONTIN) 300 MG capsule Take 1 capsule (300 mg total) by mouth at bedtime for 3 nights. Then increase to 1 capsule two times daily. 60 capsule 1   lisinopril (ZESTRIL) 40 MG tablet TAKE 1 TABLET (40 MG TOTAL) BY MOUTH DAILY. 90 tablet 0   lisinopril (ZESTRIL) 40 MG tablet Take 1 tablet (40 mg total) by mouth daily. 90 tablet 1   metoprolol tartrate (LOPRESSOR) 50 MG tablet Take 1 tablet by mouth twice daily with food 60 tablet 2   montelukast (SINGULAIR) 10 MG tablet Take 1 tablet (10 mg total) by mouth at bedtime. 30 tablet 5   Multiple Vitamins-Minerals (MULTIVITAMIN PO) Take 1 tablet by mouth daily.     omeprazole (PRILOSEC) 20 MG capsule Take 1 capsule (20 mg total) by mouth daily. 90 capsule 3   potassium chloride SA (KLOR-CON M) 20 MEQ  tablet Take by mouth twice daily with food 60 tablet 2   vitamin B-12 (CYANOCOBALAMIN) 1000 MCG tablet Take 1,000 mcg by mouth daily.     zinc gluconate 50 MG tablet Take 50 mg by mouth daily.     No current facility-administered medications on file prior to visit.     Allergies:   Penicillins   Social History   Socioeconomic History   Marital status: Divorced    Spouse name: Not on file   Number of children: 2   Years of education: Not on file   Highest education level: Not on file  Occupational History   Occupation: truck driver  Tobacco Use   Smoking status: Never   Smokeless tobacco: Never  Vaping Use   Vaping Use: Never used  Substance and Sexual Activity   Alcohol use: Not Currently    Alcohol/week: 1.0 - 2.0 standard drink    Types: 1 - 2 Glasses of wine per week    Comment: occoasional.   Drug use: No   Sexual activity: Yes  Other Topics Concern   Not on file  Social History Narrative   Lives with 2 sons.    Social Determinants of Health   Financial Resource Strain: Not on file  Food Insecurity: Not on file  Transportation Needs: Not on file  Physical Activity: Not on file  Stress: Not on file  Social Connections: Not on file     Family History: The patient's family history includes Allergy (severe) in her sister and son; CAD in her mother; Colon polyps in her mother and sister; Crohn's disease in her mother; Diabetes in her maternal grandmother; Diabetes Mellitus II in her mother; Heart attack in her maternal grandmother; Hypertension in her maternal grandmother; Prostate cancer in her maternal grandfather; Stroke in her mother.  ROS:   Please see the history of present illness.     All other systems reviewed and are negative.  EKGs/Labs/Other Studies Reviewed:  The following studies were reviewed today:   EKG:  EKG is  ordered today.  The ekg ordered today demonstrates   Initial Visit NSR, poor r wave progression may be 2/2 habitus, no  significant changes from prior ekg  Recent Labs: 07/31/2021: ALT 13 08/27/2021: BUN 20; Creatinine, Ser 1.02; Hemoglobin 12.5; Platelets 359; Potassium 3.1; Sodium 138; TSH 4.452  Recent Lipid Panel    Component Value Date/Time   CHOL 191 02/02/2020 1616   TRIG 129 08/08/2020 1336   HDL 37 (L) 02/02/2020 1616   CHOLHDL 5.2 (H) 02/02/2020 1616   LDLCALC 129 (H) 02/02/2020 1616     Risk Assessment/Calculations:     The 10-year ASCVD risk score (Arnett DK, et al., 2019) is: 3.1%   Values used to calculate the score:     Age: 61 years     Sex: Female     Is Non-Hispanic African American: Yes     Diabetic: No     Tobacco smoker: No     Systolic Blood Pressure: 948 mmHg     Is BP treated: Yes     HDL Cholesterol: 37 mg/dL     Total Cholesterol: 191 mg/dL       Physical Exam:    VS:    Wt Readings from Last 3 Encounters:  08/30/21 (!) 306 lb (138.8 kg)  08/29/21 (!) 306 lb (138.8 kg)  08/26/21 (!) 343 lb 14.7 oz (156 kg)     GEN:  No distress, obese HEENT: moist mucous membranes LYMPHATICS: No lymphadenopathy CARDIAC: RRR, SEM RUSB, rubs, gallops RESPIRATORY:  Clear to auscultation without rales, wheezing or rhonchi  ABDOMEN: Soft, non-tender, non-distended MUSCULOSKELETAL:  No edema; No deformity , no LE edema SKIN: Warm and dry NEUROLOGIC:  Alert and oriented x 3 PSYCHIATRIC:  Normal affect   ASSESSMENT:    #Episodic Sinus Tachycardia/DOE:  Her episodic sinus tachycardia is in the setting of deconditioning. She also has pulmonary HTN, Group III in the setting of obstructive lung dx (severe persistent asthma / obesity hypoventilation syndrome). Her BMI is 50.  Her dyspnea is multifactorial with obesity, obstructive lung dx. She does not have other signs of decompensated CHF. Cxray in the ed did not show pulmonary edema. Prior echo shows  mild-mod LVH, left atrium is normal size and E/e' < 14. Her zio patch did not show any significant arrhythmia or persistent sinus  tachycardia. She has no hx of ischemic heart disease. She can continue metoprolol for blood pressure. Recommend continuing management for group III pulmonary HTN with supplemental O2/albuterol. We discussed the importance of weight loss to address obesity hypoventilation and deconditioning. If her symptoms are progressing, develops orthopnea, PND or LE edema, recommend repeat TTE and BNP as well as starting low dose lasix 20 mg daily.  #S/p axillary thrombus post COVID: continue eliquis. Still has some right shoulder pain  #HTN: Well controlled. continue current hypertensive regimen.  #CVD risk: working with a nutritionist. Continue atorvastatin 10 mg daily for now, will re-address necessity on next visit. Discussed improving physical activity  PLAN:    In order of problems listed above:   Follow up 6 months         Medication Adjustments/Labs and Tests Ordered: Current medicines are reviewed at length with the patient today.  Concerns regarding medicines are outlined above.    Signed, Theresa Mayo, MD  08/30/2021 10:52 AM    [facility]

## 2021-08-31 ENCOUNTER — Other Ambulatory Visit (HOSPITAL_COMMUNITY): Payer: Self-pay

## 2021-08-31 LAB — CYTOLOGY - PAP
Comment: NEGATIVE
Diagnosis: NEGATIVE
High risk HPV: NEGATIVE

## 2021-09-01 ENCOUNTER — Ambulatory Visit
Admission: RE | Admit: 2021-09-01 | Discharge: 2021-09-01 | Disposition: A | Discharge status: Home / Self Care | Payer: Medicaid Other | Source: Ambulatory Visit | Attending: Sports Medicine | Admitting: Sports Medicine

## 2021-09-01 ENCOUNTER — Other Ambulatory Visit: Payer: Self-pay

## 2021-09-01 DIAGNOSIS — M25511 Pain in right shoulder: Secondary | ICD-10-CM

## 2021-09-03 ENCOUNTER — Other Ambulatory Visit (HOSPITAL_COMMUNITY): Payer: Self-pay

## 2021-09-04 ENCOUNTER — Telehealth: Payer: Self-pay | Admitting: Pulmonary Disease

## 2021-09-04 ENCOUNTER — Encounter: Payer: Self-pay | Admitting: Pulmonary Disease

## 2021-09-04 MED FILL — Ferrous Sulfate Tab 325 MG (65 MG Elemental Fe): ORAL | 30 days supply | Qty: 30 | Fill #0 | Status: CN

## 2021-09-04 NOTE — Telephone Encounter (Signed)
Community message sent to [employer] asking about the order for POC and about the ONO. Called and spoke with pt letting her know that the message was sent and that once we had more info for her we would let her know. Pt verbalized understanding. Will await response.

## 2021-09-04 NOTE — Telephone Encounter (Signed)
Pt states [employer] is giving her the runaround to get her POC unit. She is already qualified for it and needs [place] to send the prescription to them again as they state they dont have it. Pt states they also havent delivered equipment for ONO either. Please advise.

## 2021-09-05 ENCOUNTER — Telehealth: Payer: Self-pay | Admitting: Sports Medicine

## 2021-09-05 ENCOUNTER — Other Ambulatory Visit (HOSPITAL_COMMUNITY): Payer: Self-pay

## 2021-09-05 ENCOUNTER — Other Ambulatory Visit: Payer: Self-pay

## 2021-09-05 ENCOUNTER — Other Ambulatory Visit: Payer: Self-pay | Admitting: Pulmonary Disease

## 2021-09-05 DIAGNOSIS — M25511 Pain in right shoulder: Secondary | ICD-10-CM

## 2021-09-05 MED ORDER — BUDESONIDE-FORMOTEROL FUMARATE 160-4.5 MCG/ACT IN AERO
2.0000 | INHALATION_SPRAY | Freq: Two times a day (BID) | RESPIRATORY_TRACT | 4 refills | Status: AC
  Filled 2021-09-05: qty 10.2, 30d supply, fill #0
  Filled 2021-12-19: qty 10.2, 30d supply, fill #1
  Filled 2022-01-22: qty 10.2, 30d supply, fill #2
  Filled 2022-03-03: qty 10.2, 30d supply, fill #3
  Filled 2022-05-17: qty 10.2, 30d supply, fill #4

## 2021-09-05 NOTE — Telephone Encounter (Signed)
°  I spoke with Theresa Pearson on the phone today after reviewing MRI findings of her right shoulder.  She has very mild tendinosis of supraspinatus, subscapularis, and long head of the biceps tendons.  No tear is seen.  Clinically I believe her pain is from adhesive capsulitis.  She is taking 300 mg of gabapentin but is still having shoulder pain.  I recommended that we try physical therapy.  I will refer her to [facility] outpatient physical therapy with instructions to start range of motion exercises only for now.  She will then follow-up with me 4 weeks after her initial PT visit.

## 2021-09-05 NOTE — Telephone Encounter (Signed)
Stephannie Peters  [place], Waldemar Dickens, [place]; Jessie Foot my... We have been in touch with Mrs. Ronnald Ramp. Her ONO is actually being picked up today.        Previous Messages   ----- Message -----  From: Lorretta Harp, CMA  Sent: 09/04/2021   4:52 PM EST  To: Darlina Guys, Rachel Moulds  Subject: POC and ONO order                               Patient reached out to [place] and was told that someone at [facility] stated that they did not have the order for the POC that was placed and also she said she has not heard about getting the device to do the ONO.   Can you please help [place] out with this. Thank you!   Raquel Sarna

## 2021-09-05 NOTE — Telephone Encounter (Signed)
Will have to call [employer] tomorrow to check on order for portable O2 order was placed and confirmation has been received.

## 2021-09-06 NOTE — Telephone Encounter (Signed)
Lm for [name] with [facility].

## 2021-09-10 ENCOUNTER — Ambulatory Visit
Admission: RE | Admit: 2021-09-10 | Discharge: 2021-09-10 | Disposition: A | Discharge status: Home / Self Care | Payer: Medicaid Other | Source: Ambulatory Visit | Attending: Obstetrics and Gynecology | Admitting: Obstetrics and Gynecology

## 2021-09-10 ENCOUNTER — Other Ambulatory Visit: Payer: Self-pay

## 2021-09-10 DIAGNOSIS — N83202 Unspecified ovarian cyst, left side: Secondary | ICD-10-CM | POA: Insufficient documentation

## 2021-09-12 ENCOUNTER — Telehealth: Payer: Self-pay | Admitting: Pulmonary Disease

## 2021-09-12 DIAGNOSIS — R0609 Other forms of dyspnea: Secondary | ICD-10-CM

## 2021-09-13 NOTE — Telephone Encounter (Signed)
Order placed for 2L pulsed O2 with [employer]. Nothing further needed at this time.  ?

## 2021-09-13 NOTE — Telephone Encounter (Signed)
Called and spoke with Theresa Pearson, and she states that we need the order that states that patient either needs  ? ?Continuous 2L o2 ? ?Or ? ?Pulsed 2L O2 ? ?Dr Halford Chessman please advise what order you would like the patient to be placed on/ ?

## 2021-09-13 NOTE — Telephone Encounter (Signed)
2 liters pulsed oxygen. ?

## 2021-09-13 NOTE — Telephone Encounter (Signed)
Called Theresa Pearson from [employer] and she is looking into the orders from Dr Halford Chessman. She will call me back with updates on what is needed.  ? ? ?

## 2021-09-18 ENCOUNTER — Ambulatory Visit: Payer: Medicaid Other | Admitting: Pulmonary Disease

## 2021-09-18 ENCOUNTER — Telehealth: Payer: Self-pay | Admitting: Pulmonary Disease

## 2021-09-18 NOTE — Telephone Encounter (Signed)
ONO with RA 09/05/21 >> test time 6 hrs 6 min.  Baseline SpO2 88%, low SpO2 74%.  Spent 3 hrs 44 min with SpO2 < 88%. ? ? ?Please let her know her oxygen level is low at night.  She needs to be set up with 2 liters oxygen at night in addition to continue 2 liters oxygen during the day with exertion. ?

## 2021-09-18 NOTE — Telephone Encounter (Signed)
Called and went over results with patient she voiced understanding and states she has a concentrator at home to use at night. Did ask about Portable O2 from [employer] and informed her an order was placed on 09/12/21 so she should be hearing from them soon but if she has questions or concerns in the meantime to call the office. Nothing further needed.  ?

## 2021-09-26 ENCOUNTER — Ambulatory Visit: Payer: Medicaid Other | Attending: Sports Medicine

## 2021-09-26 ENCOUNTER — Other Ambulatory Visit: Payer: Self-pay

## 2021-09-26 DIAGNOSIS — M6281 Muscle weakness (generalized): Secondary | ICD-10-CM | POA: Diagnosis present

## 2021-09-26 DIAGNOSIS — G8929 Other chronic pain: Secondary | ICD-10-CM | POA: Insufficient documentation

## 2021-09-26 DIAGNOSIS — M25511 Pain in right shoulder: Secondary | ICD-10-CM | POA: Insufficient documentation

## 2021-09-26 NOTE — Therapy (Signed)
?OUTPATIENT PHYSICAL THERAPY SHOULDER EVALUATION ? ? ?Patient Name: Theresa Pearson ?MRN: [id number] ?DOB:[birth year], 56 y.o., female ?Today's Date: 09/26/2021 ? ? PT End of Session - 09/26/21 1526   ? ? Visit Number 1   ? Number of Visits 17   ? Date for PT Re-Evaluation 11/21/21   ? Authorization Type [facility]   ? PT Start Time 1530   ? PT Stop Time 1600   ? PT Time Calculation (min) 30 min   ? ?  ?  ? ?  ? ? ?Past Medical History:  ?Diagnosis Date  ? Anemia   ? Ankylosing spondylitis ([place])   ? Anxiety   ? Arthritis   ? Asthma   ? Chronic headaches   ? Depression   ? Diverticulosis   ? Fibromyalgia   ? Gallstones   ? GERD (gastroesophageal reflux disease)   ? Hypertension   ? Pneumonia due to COVID-19 virus 07/2020  ? Right leg DVT ([place])   ? Tachycardia   ? ?Past Surgical History:  ?Procedure Laterality Date  ? ABDOMINAL HYSTERECTOMY  2008  ? for  fibroids   ? CESAREAN SECTION    ? CHOLECYSTECTOMY    ? THROMBECTOMY W/ EMBOLECTOMY Right 08/23/2020  ? Procedure: Right Upper Extremity Thrombectomy;  Surgeon: Marty Heck, MD;  Location: [place];  Service: Vascular;  Laterality: Right;  ? ?Patient Active Problem List  ? Diagnosis Date Noted  ? Ovarian cyst 08/29/2021  ? Left ankle pain 11/28/2020  ? Influenza vaccine refused 09/05/2020  ? History of COVID-19 08/28/2020  ? 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 Prolonged QT interval 08/08/2020  ? Epigastric pain 06/24/2016  ? Bilateral chronic knee pain 10/16/2015  ? Severe obesity (BMI >= 40) ([place]) 10/16/2015  ? Anemia 09/19/2015  ? Vitamin D deficiency 09/19/2015  ? Bilateral impacted cerumen 09/19/2015  ? GAD (generalized anxiety disorder) 03/04/2014  ? HTN (hypertension) 06/27/2013  ? Sleep apnea 06/27/2013  ? ? ?PCP: Pcp, No ? ?REFERRING PROVIDER: Thurman Coyer, DO ? ?REFERRING DIAG:  ?M25.511 (ICD-10-CM) - Pain in joint of right shoulder ? ?THERAPY DIAG:  ?Chronic right shoulder pain ? ?Muscle weakness (generalized) ? ? ?ONSET DATE:  Chronic ? ?SUBJECTIVE:                                                                                                                                                                                     ? ?SUBJECTIVE STATEMENT: ?Pt presents to PT with reports of chronic R shoulder pain and discomfort. Denies MOI, pain has gradually been getting worse, but has been slightly better since starting gabapentin. Has referral of pain into  R bicep, also decreased sensation but believes this could be due to blood clot that was in axillary nerve. Pt is R hand dominant and has had trouble with home ADLs that involve reaching and lifting.  ? ?PERTINENT HISTORY: ?Long covid, DVT, fibromyalgia, ankylosing spondylitis, depression, HTN ? ?PAIN:  ?Are you having pain? Yes: NPRS scale: 2/10 (8/10 at worst) ?Pain location: R shoulder ?Aggravating factors: reaching, lifting ?Relieving factors: rest, medication ? ?PRECAUTIONS: None ? ?WEIGHT BEARING RESTRICTIONS No ? ?FALLS:  ?Has patient fallen in last 6 months? No Number of falls: N/A ? ?LIVING ENVIRONMENT: ?Lives with: lives with their family ?Lives in: House/apartment ?Stairs: No; N/A ?Has following equipment at home: None ? ?OCCUPATION: ?Not currently working ? ?PLOF: Independent and Independent with basic ADLs ? ?PATIENT GOALS: decrease R shoulder pain, improve range to decrease difficulty with ADLs ? ?OBJECTIVE:  ? ?DIAGNOSTIC FINDINGS:  ?CLINICAL DATA:  Right shoulder pain and stiffness. ?  ?EXAM: ?MRI OF THE RIGHT SHOULDER WITHOUT CONTRAST ?  ?TECHNIQUE: ?Multiplanar, multisequence MR imaging of the shoulder was performed. ?No intravenous contrast was administered. ?  ?COMPARISON:  None. ?  ?FINDINGS: ?Rotator cuff: Mild tendinosis of the supraspinatus tendon. ?Infraspinatus tendon is intact. Mild tendinosis of the subscapularis ?tendon. Subscapularis tendon is intact. ?  ?Muscles: No muscle atrophy or edema. No intramuscular fluid ?collection or hematoma. ?  ?Biceps Long Head:  Mild tendinosis of the intra-articular portion of ?the long head of the biceps tendon. ?  ?Acromioclavicular Joint: Mild arthropathy of the acromioclavicular ?joint. Trace subacromial/subdeltoid bursal fluid. ?  ?Glenohumeral Joint: No joint effusion. No chondral defect. ?  ?Labrum: Grossly intact, but evaluation is limited by lack of ?intraarticular fluid/contrast. ?  ?Bones: No fracture or dislocation. No aggressive osseous lesion. ?Mild subcortical reactive marrow changes at the infraspinatus ?insertion. ?  ?Other: No fluid collection or hematoma. ?  ?IMPRESSION: ?1. Mild tendinosis of the supraspinatus tendon. ?2. Mild tendinosis of the subscapularis tendon. ?3. Mild tendinosis of the intra-articular portion of the long head ?of the biceps tendon.  ? ? ?PATIENT SURVEYS:  ?Quick Dash 64% disability ? ?COGNITION: ? Overall cognitive status: Within functional limits for tasks assessed ?    ?SENSATION: ?WFL ? ?POSTURE: ?Large body habitus, rounded shoulder, fwd head ? ?UPPER EXTREMITY ROM:  ? ?Active ROM Right ?09/26/2021 Left ?09/26/2021  ?Shoulder flexion 82 WNL  ?Shoulder extension    ?Shoulder abduction 65 WNL  ?Shoulder adduction    ?Shoulder internal rotation 15  WNL  ?Shoulder external rotation 25 WNL  ?Elbow flexion    ?Elbow extension    ?Wrist flexion    ?Wrist extension    ?Wrist ulnar deviation    ?Wrist radial deviation    ?Wrist pronation    ?Wrist supination    ?(Blank rows = not tested) ? ?UPPER EXTREMITY MMT: ? ?MMT Right ?09/26/2021 Left ?09/26/2021  ?Shoulder flexion 2+/5   ?Shoulder extension    ?Shoulder abduction 2+/5   ?Shoulder adduction    ?Shoulder internal rotation 2+/5   ?Shoulder external rotation 2+/5   ?Middle trapezius    ?Lower trapezius    ?Elbow flexion    ?Elbow extension    ?Wrist flexion    ?Wrist extension    ?Wrist ulnar deviation    ?Wrist radial deviation    ?Wrist pronation    ?Wrist supination    ?Grip strength (lbs)    ?(Blank rows = not tested) ? ?SHOULDER SPECIAL  TESTS: ? Impingement tests: Painful arc test: positive  ? SLAP  lesions: N/A ? Instability tests: N/A ? Rotator cuff assessment: N/A ? Biceps assessment: N/A ? ?PALPATION:  ?TTP to R bicep, R upper trap ?  ?TODAY'S TREATMENT:  ?[facility] Adult PT Treatment:  DATE: 09/26/2021 ?Therapeutic Exercise: ?Table slide R shoulder flex x 5 ?Table slide R shoulder scap x 5 ?R shoulder ER AAROM x 5  ?R shoulder pendulum x 10 ? ?PATIENT EDUCATION: ?Education details: eval findings, Quick DASH, HEP, POC ?Person educated: Patient ?Education method: Explanation, Demonstration, and Handouts ?Education comprehension: verbalized understanding and returned demonstration ? ? ?HOME EXERCISE PROGRAM: ?Access Code: [email or web address] ?URL: [email or web address] ?Date: 09/26/2021 ?Prepared by: Octavio Manns ? ?Exercises ?Seated Shoulder Flexion Towel Slide at Table Top Full Range of Motion - 2 x daily - 7 x weekly - 2 sets - 10 reps - 5 sec hold ?Seated Shoulder Scaption Slide at Table Top with Forearm in Neutral - 2 x daily - 7 x weekly - 2 sets - 10 reps - 5 sec hold ?Seated Shoulder External Rotation AAROM with Dowel - 2 x daily - 7 x weekly - 2 sets - 10 reps - 5 sec hold ?Circular Shoulder Pendulum with Table Support - 2 x daily - 7 x weekly - 2 sets - 10 reps - 5 sec hold ? ?ASSESSMENT: ? ?CLINICAL IMPRESSION: ?Patient is a 56 y.o. F who was seen today for physical therapy evaluation and treatment for chronic R shoulder pain. Physical findings are consistent with physician impression, as pt demonstrates significant R shoulder pain and weakness. ROM restrictions appear in capsular pattern, with pain especially with IR/ER and abduction. Her Quick DASH score indicates severe disability in the functioning of home ADLs and shows she is operating below PLOF. Pt would benefit from skilled PT services working on improving range and strength of R shoulder in order to decrease pain and improve functional ability. ? ? ?OBJECTIVE IMPAIRMENTS  decreased endurance, decreased mobility, decreased ROM, decreased strength, impaired UE functional use, and pain.  ? ?ACTIVITY LIMITATIONS cleaning, community activity, driving, meal prep, yard work, and shopping.  ? ?PERSONAL

## 2021-10-01 ENCOUNTER — Telehealth: Payer: Self-pay | Admitting: Pulmonary Disease

## 2021-10-01 NOTE — Telephone Encounter (Signed)
Called and spoke with patient. Theresa Pearson states that the people at the local office don't want her to have the poc because they are low on supply. They want dr. Halford Chessman to change the prescription to something else instead of a concentrator.  ? ?Dr. Halford Chessman please advise.  ?

## 2021-10-01 NOTE — Telephone Encounter (Signed)
Please have them set up 2 liters oxygen at night and with exertion.  Okay to get ML6 tanks. ?

## 2021-10-01 NOTE — Telephone Encounter (Signed)
Theresa Pearson is sending Theresa Pearson a message about the POC order. ?

## 2021-10-02 ENCOUNTER — Other Ambulatory Visit (HOSPITAL_COMMUNITY): Payer: Self-pay

## 2021-10-02 ENCOUNTER — Other Ambulatory Visit: Payer: Self-pay | Admitting: Internal Medicine

## 2021-10-02 DIAGNOSIS — J4520 Mild intermittent asthma, uncomplicated: Secondary | ICD-10-CM

## 2021-10-03 ENCOUNTER — Other Ambulatory Visit (HOSPITAL_COMMUNITY): Payer: Self-pay

## 2021-10-03 ENCOUNTER — Ambulatory Visit: Payer: Medicaid Other

## 2021-10-03 ENCOUNTER — Other Ambulatory Visit: Payer: Self-pay | Admitting: Internal Medicine

## 2021-10-03 DIAGNOSIS — J4520 Mild intermittent asthma, uncomplicated: Secondary | ICD-10-CM

## 2021-10-03 MED ORDER — METOPROLOL TARTRATE 50 MG PO TABS
ORAL_TABLET | ORAL | 0 refills | Status: DC
  Filled 2021-10-03: qty 60, 30d supply, fill #0

## 2021-10-03 NOTE — Therapy (Incomplete)
?OUTPATIENT PHYSICAL THERAPY TREATMENT NOTE ? ? ?Patient Name: Theresa Pearson ?MRN: [id number] ?DOB:1965/10/01, 56 y.o., female ?Today's Date: 10/03/2021 ? ?PCP: Pcp, No ?REFERRING PROVIDER: Thurman Coyer, DO ? ? ? ?Past Medical History:  ?Diagnosis Date  ? Anemia   ? Ankylosing spondylitis ([place])   ? Anxiety   ? Arthritis   ? Asthma   ? Chronic headaches   ? Depression   ? Diverticulosis   ? Fibromyalgia   ? Gallstones   ? GERD (gastroesophageal reflux disease)   ? Hypertension   ? Pneumonia due to COVID-19 virus 07/2020  ? Right leg DVT ([place])   ? Tachycardia   ? ?Past Surgical History:  ?Procedure Laterality Date  ? ABDOMINAL HYSTERECTOMY  2008  ? for  fibroids   ? CESAREAN SECTION    ? CHOLECYSTECTOMY    ? THROMBECTOMY W/ EMBOLECTOMY Right 08/23/2020  ? Procedure: Right Upper Extremity Thrombectomy;  Surgeon: Marty Heck, MD;  Location: [place];  Service: Vascular;  Laterality: Right;  ? ?Patient Active Problem List  ? Diagnosis Date Noted  ? Ovarian cyst 08/29/2021  ? Left ankle pain 11/28/2020  ? Influenza vaccine refused 09/05/2020  ? History of COVID-19 08/28/2020  ? 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 Prolonged QT interval 08/08/2020  ? Epigastric pain 06/24/2016  ? Bilateral chronic knee pain 10/16/2015  ? Severe obesity (BMI >= 40) ([place]) 10/16/2015  ? Anemia 09/19/2015  ? Vitamin D deficiency 09/19/2015  ? Bilateral impacted cerumen 09/19/2015  ? GAD (generalized anxiety disorder) 03/04/2014  ? HTN (hypertension) 06/27/2013  ? Sleep apnea 06/27/2013  ? ? ?REFERRING DIAG: M25.511 (ICD-10-CM) - Pain in joint of right shoulder ? ?THERAPY DIAG:  ?No diagnosis found. ? ?PERTINENT HISTORY: Long covid, DVT, fibromyalgia, ankylosing spondylitis, depression, HTN ? ?PRECAUTIONS: None ? ?ONSET DATE: Chronic ? ?SUBJECTIVE: *** ? ?PAIN:  ?Are you having pain? Yes:  ?NPRS scale: ***/10 (8/10 at worst) ?Pain location: R shoulder ?Aggravating factors: reaching, lifting ?Relieving factors: rest,  medication ? ? ? ?OBJECTIVE:  ?  ?DIAGNOSTIC FINDINGS:  ?CLINICAL DATA:  Right shoulder pain and stiffness. ?  ?EXAM: ?MRI OF THE RIGHT SHOULDER WITHOUT CONTRAST ?  ?TECHNIQUE: ?Multiplanar, multisequence MR imaging of the shoulder was performed. ?No intravenous contrast was administered. ?  ?COMPARISON:  None. ?  ?FINDINGS: ?Rotator cuff: Mild tendinosis of the supraspinatus tendon. ?Infraspinatus tendon is intact. Mild tendinosis of the subscapularis ?tendon. Subscapularis tendon is intact. ?  ?Muscles: No muscle atrophy or edema. No intramuscular fluid ?collection or hematoma. ?  ?Biceps Long Head: Mild tendinosis of the intra-articular portion of ?the long head of the biceps tendon. ?  ?Acromioclavicular Joint: Mild arthropathy of the acromioclavicular ?joint. Trace subacromial/subdeltoid bursal fluid. ?  ?Glenohumeral Joint: No joint effusion. No chondral defect. ?  ?Labrum: Grossly intact, but evaluation is limited by lack of ?intraarticular fluid/contrast. ?  ?Bones: No fracture or dislocation. No aggressive osseous lesion. ?Mild subcortical reactive marrow changes at the infraspinatus ?insertion. ?  ?Other: No fluid collection or hematoma. ?  ?IMPRESSION: ?1. Mild tendinosis of the supraspinatus tendon. ?2. Mild tendinosis of the subscapularis tendon. ?3. Mild tendinosis of the intra-articular portion of the long head ?of the biceps tendon.  ?  ?  ?PATIENT SURVEYS:  ?Quick Dash 64% disability ?  ?COGNITION: ?          Overall cognitive status: Within functional limits for tasks assessed ?                                 ?  SENSATION: ?WFL ?  ?POSTURE: ?Large body habitus, rounded shoulder, fwd head ?  ?UPPER EXTREMITY ROM:  ?  ?Active ROM Right ?09/26/2021 Left ?09/26/2021  ?Shoulder flexion 82 WNL  ?Shoulder extension      ?Shoulder abduction 65 WNL  ?Shoulder adduction      ?Shoulder internal rotation 15  WNL  ?Shoulder external rotation 25 WNL  ?Elbow flexion      ?Elbow extension      ?Wrist flexion       ?Wrist extension      ?Wrist ulnar deviation      ?Wrist radial deviation      ?Wrist pronation      ?Wrist supination      ?(Blank rows = not tested) ?  ?UPPER EXTREMITY MMT: ?  ?MMT Right ?09/26/2021 Left ?09/26/2021  ?Shoulder flexion 2+/5    ?Shoulder extension      ?Shoulder abduction 2+/5    ?Shoulder adduction      ?Shoulder internal rotation 2+/5    ?Shoulder external rotation 2+/5    ?Middle trapezius      ?Lower trapezius      ?Elbow flexion      ?Elbow extension      ?Wrist flexion      ?Wrist extension      ?Wrist ulnar deviation      ?Wrist radial deviation      ?Wrist pronation      ?Wrist supination      ?Grip strength (lbs)      ?(Blank rows = not tested) ?  ?SHOULDER SPECIAL TESTS: ?           Impingement tests: Painful arc test: positive  ?           SLAP lesions: N/A ?           Instability tests: N/A ?           Rotator cuff assessment: N/A ?           Biceps assessment: N/A ?  ?PALPATION:  ?TTP to R bicep, R upper trap ?            ?TODAY'S TREATMENT:  ?[facility] Adult PT Treatment:                                                DATE: 10/03/2021 ?Therapeutic Exercise: ?Table slide R shoulder flexion, scaption, abduction x10 each ?ER AAROM with dowel ?Seated scaption AAROM with dowel ?Supine flexion with dowel AAROM ?Pball roll up wall ?Seated scapular retratction ?Sidelying ER AROM ?Manual Therapy: ?*** ?Neuromuscular re-ed: ?*** ?Therapeutic Activity: ?*** ?Modalities: ?*** ?Self Care: ?*** ? ? ?[place] Adult PT Treatment:  DATE: 09/26/2021 ?Therapeutic Exercise: ?Table slide R shoulder flex x 5 ?Table slide R shoulder scap x 5 ?R shoulder ER AAROM x 5  ?R shoulder pendulum x 10 ?  ?PATIENT EDUCATION: ?Education details: eval findings, Quick DASH, HEP, POC ?Person educated: Patient ?Education method: Explanation, Demonstration, and Handouts ?Education comprehension: verbalized understanding and returned demonstration ?  ?  ?HOME EXERCISE PROGRAM: ?Access Code: [email or web address] ?URL:  [email or web address] ?Date: 09/26/2021 ?Prepared by: Octavio Manns ?  ?Exercises ?Seated Shoulder Flexion Towel Slide at Table Top Full Range of Motion - 2 x daily - 7 x weekly - 2 sets - 10 reps - 5 sec hold ?Seated Shoulder Scaption Slide at Table Top with Forearm  in Neutral - 2 x daily - 7 x weekly - 2 sets - 10 reps - 5 sec hold ?Seated Shoulder External Rotation AAROM with Dowel - 2 x daily - 7 x weekly - 2 sets - 10 reps - 5 sec hold ?Circular Shoulder Pendulum with Table Support - 2 x daily - 7 x weekly - 2 sets - 10 reps - 5 sec hold ?  ?ASSESSMENT: ?  ?CLINICAL IMPRESSION: ?*** ? ?Patient is a 56 y.o. F who was seen today for physical therapy evaluation and treatment for chronic R shoulder pain. Physical findings are consistent with physician impression, as pt demonstrates significant R shoulder pain and weakness. ROM restrictions appear in capsular pattern, with pain especially with IR/ER and abduction. Her Quick DASH score indicates severe disability in the functioning of home ADLs and shows she is operating below PLOF. Pt would benefit from skilled PT services working on improving range and strength of R shoulder in order to decrease pain and improve functional ability. ?  ?  ?OBJECTIVE IMPAIRMENTS decreased endurance, decreased mobility, decreased ROM, decreased strength, impaired UE functional use, and pain.  ?  ?ACTIVITY LIMITATIONS cleaning, community activity, driving, meal prep, yard work, and shopping.  ?  ?PERSONAL FACTORS Fitness, Time since onset of injury/illness/exacerbation, and 3+ comorbidities: Long covid, DVT, fibromyalgia, ankylosing spondylitis, depression, HTN  are also affecting patient's functional outcome.  ?  ?  ?REHAB POTENTIAL: Good ?  ?CLINICAL DECISION MAKING: Evolving/moderate complexity ?  ?EVALUATION COMPLEXITY: Moderate ?  ?  ?GOALS: ?Goals reviewed with patient? No ?  ?SHORT TERM GOALS: Target date: 10/17/2021 ?  ?Pt will be compliant and knowledgeable with  initial HEP for improved comfort and carryover ?Baseline: initial HEP given ?Goal status: INITIAL ?  ?2.  Pt will self report R shoulder pain no greater than 6/10 for improved comfort and functional ability ?Baseline: 8/10 at worst ?Goal status: I

## 2021-10-04 ENCOUNTER — Other Ambulatory Visit (HOSPITAL_COMMUNITY): Payer: Self-pay

## 2021-10-04 ENCOUNTER — Other Ambulatory Visit: Payer: Self-pay

## 2021-10-04 ENCOUNTER — Ambulatory Visit (INDEPENDENT_AMBULATORY_CARE_PROVIDER_SITE_OTHER): Payer: Commercial Managed Care - POS | Admitting: Sports Medicine

## 2021-10-04 MED ORDER — ATORVASTATIN CALCIUM 10 MG PO TABS
ORAL_TABLET | ORAL | 5 refills | Status: AC
  Filled 2021-10-04: qty 30, 30d supply, fill #0
  Filled 2022-03-03: qty 30, 30d supply, fill #1
  Filled 2022-08-15: qty 30, 30d supply, fill #2

## 2021-10-05 ENCOUNTER — Telehealth: Payer: Self-pay | Admitting: Pulmonary Disease

## 2021-10-05 NOTE — Telephone Encounter (Signed)
Called and spoke with patient. I advised that I would ask our PCC's if the order we already sent to [facility] would be okay to use or if we need to do a whole new prescription.  ? ?PCC's can you advise?  ?

## 2021-10-05 NOTE — Telephone Encounter (Signed)
Theresa Pearson, Theresa Pearson; Theresa Pearson ?We got this taken care of! :) ?

## 2021-10-05 NOTE — Telephone Encounter (Signed)
See message from 10/01/2021 I have also sent message to [employer] who [facility] ? ?

## 2021-10-09 ENCOUNTER — Ambulatory Visit (INDEPENDENT_AMBULATORY_CARE_PROVIDER_SITE_OTHER): Payer: Commercial Managed Care - POS | Admitting: Sports Medicine

## 2021-10-09 ENCOUNTER — Ambulatory Visit (INDEPENDENT_AMBULATORY_CARE_PROVIDER_SITE_OTHER): Payer: Commercial Managed Care - POS

## 2021-10-09 ENCOUNTER — Encounter (INDEPENDENT_AMBULATORY_CARE_PROVIDER_SITE_OTHER): Payer: Self-pay | Admitting: Sports Medicine

## 2021-10-09 VITALS — BP 108/68 | HR 81

## 2021-10-09 DIAGNOSIS — M7711 Lateral epicondylitis, right elbow: Secondary | ICD-10-CM

## 2021-10-09 NOTE — Progress Notes (Signed)
[facility]    Provider: Schuyler Amor, DO  Date of Exam:  10/09/2021   Patient:  Elizabeth Dougherty  DOB:  [birth year]    AGE:  56 y.o.  MR#:  [id number]     Chief Complaint: Right elbow pain.    HPI:  Elizabeth Dougherty is a pleasant 56 y.o.-year-old RHD female who presents today in f/u for Elizabeth Dougherty right elbow pain.  Since Elizabeth Dougherty last visit, she did a HEP but has not done formal PT.  She reports overall moderate improvements but continues to have pain at the lateral elbow with lifting and gripping.   She denies swelling and stiffness post activities.  Denies numbness/tingling.  She has no significant history of prior injury.  No pain meds.   Elizabeth Dougherty is now here for further evaluation and discussion of treatment options.    For other past medical history, social history, family history and past surgical history please see them listed below.    Problem List: There is no problem list on file for this patient.       Past Medical History:    Past Medical History:   Diagnosis Date    Abdominal hernia 2008    Abnormal Pap smear     Genital warts     Hemorrhoids without complication        Social History:   Social History     Tobacco Use    Smoking status: Never    Smokeless tobacco: Never   Substance Use Topics    Alcohol use: Yes     Alcohol/week: 1.0 standard drink of alcohol     Types: 1 Glasses of wine per week     Comment: occasional    Drug use: Never       Family History: History reviewed. No pertinent family history.    Past Surgical History:    Past Surgical History:   Procedure Laterality Date    BREAST BIOPSY      CERVICAL BIOPSY  W/ LOOP ELECTRODE EXCISION         Medications:      Current Outpatient Medications:     Multiple Vitamin (MULTI-VITAMIN DAILY PO), Multi Vitamin  1 po qd, Disp: , Rfl:        Allergies:  No Known Allergies    ROS:    Constitutional: No fatigue, fever, weight loss, or weight gain.   Ears, Nose, Mouth & Throat: No sore throat or hearing loss.   Cardiovascular: No  chest pain, blood clots, or leg cramps.   Respiratory: No shortness of breath, cough, or difficulty breathing.   Gastrointestinal: No nausea, vomiting, diarrhea, or loss of appetite.   Genitourinary: No polyuria or kidney disease.   Musculoskeletal: No joint aches, muscle weakness or swelling of joints/body parts other than that mentioned above.  Integumentary: No finger nail changes or skin dryness.   Neurological: No numbness, burning discomfort, or headaches.   Psychiatric: No depression or anxiety.   Endocrine: No increased thirst, change in appetite or thyroid disease.   Hematologic/Lymphatic: No easy bruising or anemia.     EXAM:   Vitals reviewed.  Constitutional: Pt is well-developed, well-nourished, and in no distress.   HENT:   Head: Normocephalic and atraumatic.   Eyes: Conjunctivae are normal.   Pulmonary/Chest: Effort normal.   Neurological: Pt is alert and oriented to person, place, and time.   Skin: Skin is warm and dry. No rash noted. Pt is not diaphoretic.  Psychiatric: Affect normal.     Right Elbow  Observation: normal; no swelling, deformity or ecchymosis  Palpation: tender over lateral epicondyle at extensor tendon insertion, no tenderness at the medial epicondyle, olecranon bursa  ROM: Flexion 135 Deg  ;  Extension 0 Deg  ;  Supination 90 Deg  ;  Pronation 90 Deg   Strength testing:     Wrist extension - 5 / 5 with pain    Wrist flexion - 5 / 5    Finger Extension - 5 / 5 with pain    Finger Flexion - 5 / 5    Supination - 5 / 5 with pain    Pronation - 5 / 5  UCL laxity: negative  Distal Sensory: normal  Ulnar Nerve:     Tinel's: negative;     Subluxation: negative  Distal neurological exam:  Intact      Vitals: LMP 11/12/2017     General: Elizabeth Dougherty was pleasant, oriented, easily engaged, displayed logical thinking with clear speech and was neat in appearance. Elizabeth Dougherty general appearance was normal, well-developed and well-nourished. She was comfortable in the presence of Elizabeth Dougherty elbow pain.    Gait:  The patient demonstrated non antalgic gait with intact coordination and balance.     STUDIES:   3V Xrays of Right elbow (AP, Lat, Oblique) done and reviewed today - negative for any noted acute bony abnormalities.       ASSESSMENT/PLAN:   Elizabeth Dougherty was seen today for elbow problem.    Diagnoses and all orders for this visit:    Right lateral epicondylitis  -     X-ray elbow right AP lateral and obliques  -     Ambulatory referral to Physical Therapy; Future    Recommended avoiding aggravating activities, discussed activity modifications to tolerance.  May take NSAIDs prn pain as instructed with food, cautions discussed.  Will see how formal PT goes with dry needling.  Follow up 8 weeks, sooner prn.  We discussed injections if symptoms persist.    25 minutes were spent face-to-face with the patient, with coordination of care and counseling about disease process and expect recovery comprising > 50 percent of the visit.    The review of the patient's medications does not in any way constitute an endorsement, by this clinician,  of their use, dosage, indications, route, efficacy, interactions, or other clinical parameters.     This note was generated within the [employer] EMR using Dragon medical speech recognition software and may contain inherent errors or omissions not intended by the user. Grammatical and punctuation errors, random word insertions, deletions, pronoun errors and incomplete sentences are occasional consequences of this technology due to software limitations. Not all errors are caught or corrected.  Although every attempt is made to root out erroneus and incomplete transcription, the note may still not fully represent the intent or opinion of the author. If there are questions or concerns about the content of this note or information contained within the body of this dictation they should be addressed directly with the author for clarification.

## 2021-10-10 ENCOUNTER — Telehealth: Payer: Self-pay

## 2021-10-10 ENCOUNTER — Ambulatory Visit: Payer: Medicaid Other

## 2021-10-10 NOTE — Telephone Encounter (Signed)
LVM regarding missed appointment today. No further appointments scheduled, gave phone number to schedule more. ?1st no-show ? ?Evelene Croon, PTA ?10/10/21 4:00 PM ? ?

## 2021-10-10 NOTE — Therapy (Incomplete)
?OUTPATIENT PHYSICAL THERAPY TREATMENT NOTE ? ? ?Patient Name: Theresa Pearson ?MRN: [id number] ?DOB:08-Apr-1966, 56 y.o., female ?Today's Date: 10/10/2021 ? ?PCP: Pcp, No ?REFERRING PROVIDER: Thurman Coyer, DO ? ? ? ?Past Medical History:  ?Diagnosis Date  ? Anemia   ? Ankylosing spondylitis ([place])   ? Anxiety   ? Arthritis   ? Asthma   ? Chronic headaches   ? Depression   ? Diverticulosis   ? Fibromyalgia   ? Gallstones   ? GERD (gastroesophageal reflux disease)   ? Hypertension   ? Pneumonia due to COVID-19 virus 07/2020  ? Right leg DVT ([place])   ? Tachycardia   ? ?Past Surgical History:  ?Procedure Laterality Date  ? ABDOMINAL HYSTERECTOMY  2008  ? for  fibroids   ? CESAREAN SECTION    ? CHOLECYSTECTOMY    ? THROMBECTOMY W/ EMBOLECTOMY Right 08/23/2020  ? Procedure: Right Upper Extremity Thrombectomy;  Surgeon: Marty Heck, MD;  Location: [facility];  Service: Vascular;  Laterality: Right;  ? ?Patient Active Problem List  ? Diagnosis Date Noted  ? Ovarian cyst 08/29/2021  ? Left ankle pain 11/28/2020  ? History of COVID-19 08/28/2020  ? 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 Prolonged QT interval 08/08/2020  ? Epigastric pain 06/24/2016  ? Bilateral chronic knee pain 10/16/2015  ? Severe obesity (BMI >= 40) ([place]) 10/16/2015  ? Anemia 09/19/2015  ? Vitamin D deficiency 09/19/2015  ? GAD (generalized anxiety disorder) 03/04/2014  ? HTN (hypertension) 06/27/2013  ? Sleep apnea 06/27/2013  ? ? ?REFERRING DIAG: M25.511 (ICD-10-CM) - Pain in joint of right shoulder ? ?THERAPY DIAG:  ?No diagnosis found. ? ?PERTINENT HISTORY: Long covid, DVT, fibromyalgia, ankylosing spondylitis, depression, HTN ? ?PRECAUTIONS: None ? ?ONSET DATE: Chronic ? ?SUBJECTIVE: *** ? ?PAIN:  ?Are you having pain? Yes:  ?NPRS scale: ***/10 (8/10 at worst) ?Pain location: R shoulder ?Aggravating factors: reaching, lifting ?Relieving factors: rest, medication ? ? ? ?OBJECTIVE:  ?  ?DIAGNOSTIC FINDINGS:  ?CLINICAL DATA:  Right shoulder  pain and stiffness. ?  ?EXAM: ?MRI OF THE RIGHT SHOULDER WITHOUT CONTRAST ?  ?TECHNIQUE: ?Multiplanar, multisequence MR imaging of the shoulder was performed. ?No intravenous contrast was administered. ?  ?COMPARISON:  None. ?  ?FINDINGS: ?Rotator cuff: Mild tendinosis of the supraspinatus tendon. ?Infraspinatus tendon is intact. Mild tendinosis of the subscapularis ?tendon. Subscapularis tendon is intact. ?  ?Muscles: No muscle atrophy or edema. No intramuscular fluid ?collection or hematoma. ?  ?Biceps Long Head: Mild tendinosis of the intra-articular portion of ?the long head of the biceps tendon. ?  ?Acromioclavicular Joint: Mild arthropathy of the acromioclavicular ?joint. Trace subacromial/subdeltoid bursal fluid. ?  ?Glenohumeral Joint: No joint effusion. No chondral defect. ?  ?Labrum: Grossly intact, but evaluation is limited by lack of ?intraarticular fluid/contrast. ?  ?Bones: No fracture or dislocation. No aggressive osseous lesion. ?Mild subcortical reactive marrow changes at the infraspinatus ?insertion. ?  ?Other: No fluid collection or hematoma. ?  ?IMPRESSION: ?1. Mild tendinosis of the supraspinatus tendon. ?2. Mild tendinosis of the subscapularis tendon. ?3. Mild tendinosis of the intra-articular portion of the long head ?of the biceps tendon.  ?  ?  ?PATIENT SURVEYS:  ?Quick Dash 64% disability ?  ?COGNITION: ?          Overall cognitive status: Within functional limits for tasks assessed ?                                 ?  SENSATION: ?WFL ?  ?POSTURE: ?Large body habitus, rounded shoulder, fwd head ?  ?UPPER EXTREMITY ROM:  ?  ?Active ROM Right ?09/26/2021 Left ?09/26/2021  ?Shoulder flexion 82 WNL  ?Shoulder extension      ?Shoulder abduction 65 WNL  ?Shoulder adduction      ?Shoulder internal rotation 15  WNL  ?Shoulder external rotation 25 WNL  ?Elbow flexion      ?Elbow extension      ?Wrist flexion      ?Wrist extension      ?Wrist ulnar deviation      ?Wrist radial deviation      ?Wrist  pronation      ?Wrist supination      ?(Blank rows = not tested) ?  ?UPPER EXTREMITY MMT: ?  ?MMT Right ?09/26/2021 Left ?09/26/2021  ?Shoulder flexion 2+/5    ?Shoulder extension      ?Shoulder abduction 2+/5    ?Shoulder adduction      ?Shoulder internal rotation 2+/5    ?Shoulder external rotation 2+/5    ?Middle trapezius      ?Lower trapezius      ?Elbow flexion      ?Elbow extension      ?Wrist flexion      ?Wrist extension      ?Wrist ulnar deviation      ?Wrist radial deviation      ?Wrist pronation      ?Wrist supination      ?Grip strength (lbs)      ?(Blank rows = not tested) ?  ?SHOULDER SPECIAL TESTS: ?           Impingement tests: Painful arc test: positive  ?           SLAP lesions: N/A ?           Instability tests: N/A ?           Rotator cuff assessment: N/A ?           Biceps assessment: N/A ?  ?PALPATION:  ?TTP to R bicep, R upper trap ?            ?TODAY'S TREATMENT:  ?[facility] Adult PT Treatment:                                                DATE: 10/03/2021 ?Therapeutic Exercise: ?Table slide R shoulder flexion, scaption, abduction 2x10 each ?ER AAROM with dowel 2x10 ?Seated scaption AAROM with dowel 2x10 ?Supine flexion with dowel AAROM 2x10 ?Towel slide up wall flexion 2x10 ?Seated scapular retratction 2x10 ?Sidelying ER AROM 2x10 ?Seated ER with shoulder abducted 90? on table 10" x10 ?Manual Therapy: ?Manual ROM in all planes ? ? ? ?[facility] Adult PT Treatment:  DATE: 09/26/2021 ?Therapeutic Exercise: ?Table slide R shoulder flex x 5 ?Table slide R shoulder scap x 5 ?R shoulder ER AAROM x 5  ?R shoulder pendulum x 10 ?  ?PATIENT EDUCATION: ?Education details: eval findings, Quick DASH, HEP, POC ?Person educated: Patient ?Education method: Explanation, Demonstration, and Handouts ?Education comprehension: verbalized understanding and returned demonstration ?  ?  ?HOME EXERCISE PROGRAM: ?Access Code: [email or web address] ?URL: [email or web address] ?Date: 09/26/2021 ?Prepared by: Octavio Manns ?   ?Exercises ?Seated Shoulder Flexion Towel Slide at Table Top Full Range of Motion - 2 x daily - 7 x weekly - 2 sets - 10 reps - 5  sec hold ?Seated Shoulder Scaption Slide at Table Top with Forearm in Neutral - 2 x daily - 7 x weekly - 2 sets - 10 reps - 5 sec hold ?Seated Shoulder External Rotation AAROM with Dowel - 2 x daily - 7 x weekly - 2 sets - 10 reps - 5 sec hold ?Circular Shoulder Pendulum with Table Support - 2 x daily - 7 x weekly - 2 sets - 10 reps - 5 sec hold ?  ?ASSESSMENT: ?  ?CLINICAL IMPRESSION: ?*** ? ?Patient is a 56 y.o. F who was seen today for physical therapy evaluation and treatment for chronic R shoulder pain. Physical findings are consistent with physician impression, as pt demonstrates significant R shoulder pain and weakness. ROM restrictions appear in capsular pattern, with pain especially with IR/ER and abduction. Her Quick DASH score indicates severe disability in the functioning of home ADLs and shows she is operating below PLOF. Pt would benefit from skilled PT services working on improving range and strength of R shoulder in order to decrease pain and improve functional ability. ?  ?  ?OBJECTIVE IMPAIRMENTS decreased endurance, decreased mobility, decreased ROM, decreased strength, impaired UE functional use, and pain.  ?  ?ACTIVITY LIMITATIONS cleaning, community activity, driving, meal prep, yard work, and shopping.  ?  ?PERSONAL FACTORS Fitness, Time since onset of injury/illness/exacerbation, and 3+ comorbidities: Long covid, DVT, fibromyalgia, ankylosing spondylitis, depression, HTN  are also affecting patient's functional outcome.  ?  ?  ?REHAB POTENTIAL: Good ?  ?CLINICAL DECISION MAKING: Evolving/moderate complexity ?  ?EVALUATION COMPLEXITY: Moderate ?  ?  ?GOALS: ?Goals reviewed with patient? No ?  ?SHORT TERM GOALS: Target date: 10/17/2021 ?  ?Pt will be compliant and knowledgeable with initial HEP for improved comfort and carryover ?Baseline: initial HEP given ?Goal  status: INITIAL ?  ?2.  Pt will self report R shoulder pain no greater than 6/10 for improved comfort and functional ability ?Baseline: 8/10 at worst ?Goal status: INITIAL ?  ?LONG TERM GOALS: Target date: 12/05/2021 ?

## 2021-10-11 ENCOUNTER — Encounter: Payer: Self-pay | Admitting: Nurse Practitioner

## 2021-10-11 ENCOUNTER — Ambulatory Visit: Payer: Medicaid Other | Admitting: Nurse Practitioner

## 2021-10-11 VITALS — BP 108/70 | HR 81 | Ht 65.0 in | Wt 307.0 lb

## 2021-10-11 DIAGNOSIS — Z7901 Long term (current) use of anticoagulants: Secondary | ICD-10-CM | POA: Diagnosis not present

## 2021-10-11 DIAGNOSIS — R1013 Epigastric pain: Secondary | ICD-10-CM | POA: Diagnosis not present

## 2021-10-11 DIAGNOSIS — Z1211 Encounter for screening for malignant neoplasm of colon: Secondary | ICD-10-CM | POA: Diagnosis not present

## 2021-10-11 DIAGNOSIS — K625 Hemorrhage of anus and rectum: Secondary | ICD-10-CM

## 2021-10-11 NOTE — Patient Instructions (Signed)
It has been recommended to you by your physician that you have a(n) Colonoscopy and EGD at [facility] with Dr. Havery Moros completed. We did not schedule the procedure(s) today due to lack of availability at the hospital. At this time Dr. Havery Moros is booked through June. We will add you to the wait list for when July schedule opens. You will be scheduled for a pre-visit and procedure at that time. ? ?Thank you for trusting me with your gastrointestinal care!   ? ?Theresa Savoy, NP ? ? ?BMI: ? ?If you are age 55 or older, your body mass index should be between 23-30. Your Body mass index is 51.09 kg/m?Marland Kitchen If this is out of the aforementioned range listed, please consider follow up with your Primary Care Provider. ? ?If you are age 82 or younger, your body mass index should be between 19-25. Your Body mass index is 51.09 kg/m?Marland Kitchen If this is out of the aformentioned range listed, please consider follow up with your Primary Care Provider.  ? ?MY CHART: ? ?The [facility] providers would like to encourage you to use [facility] to communicate with providers for non-urgent requests or questions.  Due to long hold times on the telephone, sending your provider a message by [facility] may be a faster and more efficient way to get a response.  Please allow 48 business hours for a response.  Please remember that this is for non-urgent requests.  ? ? ?

## 2021-10-11 NOTE — Progress Notes (Signed)
? ? ?ASSESSMENT :   ? ?Patient profile:  ?Theresa Pearson is a 56 y.o. female known to Dr. Havery Moros ( 2017) referred by PCP for abdominal pain. 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 See PMH below for any additional history ? ?# Chronic intermittent rectal bleeding with bowel movements. Likely hemorrhoidal. She is not anemic. Recent CTAP without colon mass. Evaluated here in 2017 for same. Colonoscopy recommended but she was unable to proceed for financial reasons ? ?# Chronic intermittent loose stool. Possibly IBS vrs bile acid related. ? ?# Upper abdominal  / chest pain radiating through to back with associated bloating.  Lipase, LFTs normal. CT scan unremarkable. She is post-cholecystectomy. Rule out GERD / PUD. Recent leukoctyosis noted with may or may not be related to the abdominal pain.  ? ?# Chronic microcytic anemia. Baseline hgb 10.5. She is microcytic without history of iron deficiency though she takes iron. In ED mid Feb hgb was 12.5 but this is spurious result. It was back to baseline at 10.5 on repeat labs at PCP's office 09/21/21.  ? ?# Chronic GERD. Asymptomatic on Omeprazole as needed ? ?# History of COVID 19 PNA in January 2022 - she is O2 dependent. She also has history of persistent severe asthma followed by Pulmonary ? ?# Chronic Eliquis. 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 ?PLAN:    ?Will evaluation of rectal bleeding and intermittent diarrhea will proceed with colonoscopy. Due to co-morbidities and BMI it will need to be done at hospital.The risks and benefits of colonoscopy with possible polypectomy /  biopsies were discussed and the patient agrees to proceed. ?If colonoscopy negative for cause of intermittent diarrhea consider trial of bile acid sequestrant  ?For evaluation of persistent upper abdominal pain will schedule for EGD to be done at time of colonoscopy. The risks and benefits of EGD with possible biopsies were discussed with the patient who agrees to proceed.  ?Hold Eliquis for 2 days before procedure - will instruct when and how to resume after procedure. Patient understands that there is a low but real risk of cardiovascular event such as heart attack, stroke, or embolism /  thrombosis, or ischemia while off Eliquis. The patient consents to proceed. Will communicate by phone or EMR with patient's prescribing provider to confirm that holding Eliquis is reasonable in this case.  ? ? ?History of Present Illness:  ? ?Chief complaint:  follow up on rectal bleeding and also having upper abdominal pain  ? ?Patient saw [place] in 2017 with bowel changes and hematochezia. Colonoscopy was recommended but she could not afford to have it done. She still has intermittent rectal bleeding with bowel movements. Sometimes she has associated "rectal aching" . She uses a cream inside rectum as needed but doesn't know the name. She is ready and able to proceed with colonoscopy now.  ? ?Theresa Pearson has been having a lot of abdominal bloating along with generalized upper abdominal pain / chest pain radiating through to her back. Initially she said the symptoms started a couple of weeks ago but I see she was in ED with same pain in mid February,  6 weeks ago. The pain is random, she cannot relate it to eating. Episodes occurs 2-3  times a week, last 20-30 minutes and then resolve spontaneously.   No associated nausea / vomiting. No NSAID use. In ED a cardiac event was ruled out.  WBC was 13.5, Hgb 12.5 ( baseline mid 10). . Trop normal. CXR negative. LFT nor lipase done but both normal in mid January when she was in ED for lower  abdominal pain which has now resolved   ? ?Theresa Pearson has intermittent loose stool with urgency (about 50 % of time). The remainder of time her stools are solid. This has been her bowel pattern for years. The loose stools generally occur after eating but not consistently with any particular food ? ?No Lovilia of colon cancer.  Mother, sister and maternal GM had polyps.  ? ?She gives a diagnosis of long COVID and this is why she is on oxygen. ? ?Previous Labs / Imaging:: ? ?  Latest Ref Rng & Units 08/27/2021  ?  1:02 AM 07/31/2021  ?  7:45 PM 09/05/2020  ? 12:20 PM  ?CBC  ?WBC 4.0 - 10.5 K/uL 13.5   15.2   9.1    ?Hemoglobin 12.0 - 15.0 g/dL 12.5   10.5   10.9    ?Hematocrit 36.0 - 46.0 % 40.1   34.7   35.8    ?Platelets 150 - 400 K/uL 359   434   446    ? ? ?Lab Results  ?Component Value Date  ? LIPASE 22 07/31/2021  ? ? ? ?Previous GI Evaluations;   ? ?Imaging:  ? ?08/01/21 CTAP w/ contrast for abdominal pain  ?IMPRESSION: ?1. No acute intra-abdominal process.  Normal appendix. ?2. Right renal cyst. ?3. Diverticulosis without diverticulitis. ?4. Cystic structure with rim calcification in the left adnexa ?measuring 4.1 cm. Ultrasound is recommended for further ?characterization on follow-up ? ? ?US PELVIC COMPLETE WITH TRANSVAGINAL ?CLINICAL DATA:  Follow-up LEFT ovarian cyst, postmenopausal ? ?EXAM: ?TRANSABDOMINAL AND TRANSVAGINAL ULTRASOUND OF PELVIS ? ?TECHNIQUE: ?Both transabdominal and transvaginal ultrasound examinations of the ?pelvis were performed. Transabdominal technique was performed for ?global imaging of the pelvis including uterus, ovaries, adnexal ?regions, and pelvic cul-de-sac. It was necessary to proceed with ?endovaginal exam following the transabdominal exam to visualize the ?ovaries. ? ?COMPARISON:  08/01/2021 ? ?FINDINGS: ?Uterus ? ?Surgically absent ? ?Endometrium ? ?Surgically absent ? ?Right ovary ? ?Not visualized, likely obscured by bowel ? ?Left ovary ? ?Measurements: 5.0 x 4.4 x 4.1 cm = volume:  46.8 mL. Complicated ?cystic lesion of the LEFT ovary measuring 3.9 x 3.6 x 2.6 cm, ?containing scattered heterogeneous internal echogenicity and debris. ?Previously the lesion measured 3.9 x 3.4 x 2.9 cm. The internal ?echogenicity appears slightly more contracted than on the previous ?exam. While this could represent a hemorrhagic cyst of the LEFT ?ovary, cystic ovarian neoplasm is not excluded. This has failed to ?resolve over 6 weeks. ? ?Other findings ? ?No free pelvic fluid.  No adnexal masses. ? ?IMPRESSION: ?Unchanged size of complex cystic lesion of the LEFT ovary 3.9 cm in ?greatest dimension, containing heterogeneous internal echogenicity. ? ?This could represent a hemorrhagic cyst which has failed to resolve ?or a complex cystic ovarian neoplasm. ? ?Surgical evaluation recommended. ? ?Electronically Signed ?  By: Lavonia Dana M.D. ?  On: 09/11/2021 16:59 ? ? ? ?Past Medical History:  ?Diagnosis Date  ? Anemia   ? Ankylosing spondylitis ([place])   ? Anxiety   ? Arthritis   ? Asthma   ? Chronic headaches   ? Depression   ? Diverticulosis   ?  Fibromyalgia   ? Gallstones   ? GERD (gastroesophageal reflux disease)   ? Hypertension   ? Pneumonia due to COVID-19 virus 07/2020  ? Right leg DVT ([place])   ? Tachycardia   ? ?Past Surgical History:  ?Procedure Laterality Date  ? ABDOMINAL HYSTERECTOMY  2008  ? for  fibroids   ? CESAREAN SECTION    ? CHOLECYSTECTOMY    ? THROMBECTOMY W/ EMBOLECTOMY Right 08/23/2020  ? Procedure: Right Upper Extremity Thrombectomy;  Surgeon: Marty Heck, MD;  Location: [facility];  Service: Vascular;  Laterality: Right;  ? ?Family History  ?Problem Relation Age of Onset  ? CAD Mother   ? Diabetes Mellitus II Mother   ? Stroke Mother   ? Colon polyps Mother   ? Crohn's disease Mother   ? Allergy (severe) Sister   ? Colon polyps Sister   ? Allergy (severe) Son   ? Prostate cancer Maternal Grandfather   ? Diabetes Maternal Grandmother   ? Hypertension Maternal Grandmother   ? Heart attack  Maternal Grandmother   ? ?Social History  ? ?Tobacco Use  ? Smoking status: Never  ? Smokeless tobacco: Never  ?Vaping Use  ? Vaping Use: Never used  ?Substance Use Topics  ? Alcohol use: Not Currently  ?  Alcohol

## 2021-10-12 ENCOUNTER — Encounter: Payer: Self-pay | Admitting: Nurse Practitioner

## 2021-10-13 NOTE — Progress Notes (Signed)
Agree with assessment and plan as outlined.  ?Patient's symptoms and history warrants EGD and colonoscopy, unfortunately due to comorbidities her case needs to be done at the hospital. Unfortunately there is a significant backlog and limited access to endoscopic procedures at the hospital right now. I am currently booking into July AT [facility]AR[facility] for procedures at the hospital.  ? ?Theresa Pearson can you please let her know her procedures if done with our practice unfortunately will not be for 3 months or so at earliest due to hospital access issue. If she does not want to wait that long she can consider seeking evaluation at another practice but I don't think she will find a shorter wait time anywhere in the [place] area. She will be contacted for scheduling when we have an opening in the system for her. Thanks ? ? ?

## 2021-10-15 ENCOUNTER — Ambulatory Visit: Payer: Medicaid Other | Admitting: Obstetrics and Gynecology

## 2021-10-15 ENCOUNTER — Telehealth: Payer: Self-pay

## 2021-10-15 NOTE — Telephone Encounter (Signed)
Contacted the patient and advised. Questions invited. States no questions. ?

## 2021-10-15 NOTE — Telephone Encounter (Signed)
-----   Message from Willia Craze, NP sent at 10/15/2021 10:46 AM EDT ----- ?Beth, please let her know that Dr. Havery Moros checked his scheduled for hospital procedures and this is what he found:  ? ? ?Booking into July AT EARLIEST for procedures at the hospital. We apologize but it will be 3 months or longer due to hospital access issues.   If she does not want to wait that long she can consider seeking evaluation at another practice but I don't think she will find a shorter wait time anywhere in the [place] area. Otherwise, she will be contacted for scheduling when we have an opening in the system for her. Thanks ? ?

## 2021-10-23 NOTE — Telephone Encounter (Signed)
Please refer to encounter from 3/24. ?

## 2021-10-26 ENCOUNTER — Inpatient Hospital Stay: Payer: Commercial Managed Care - POS | Admitting: Rehabilitative and Restorative Service Providers"

## 2021-10-30 ENCOUNTER — Inpatient Hospital Stay: Payer: Commercial Managed Care - POS | Attending: Sports Medicine | Admitting: Physical Therapist

## 2021-10-30 ENCOUNTER — Encounter: Payer: Self-pay | Admitting: Physical Therapist

## 2021-10-30 VITALS — BP 108/72 | HR 74

## 2021-10-30 DIAGNOSIS — M25521 Pain in right elbow: Secondary | ICD-10-CM | POA: Insufficient documentation
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
## 2021-10-31 ENCOUNTER — Encounter: Payer: Self-pay | Admitting: Obstetrics and Gynecology

## 2021-10-31 ENCOUNTER — Ambulatory Visit: Payer: Medicaid Other | Admitting: Obstetrics and Gynecology

## 2021-10-31 ENCOUNTER — Other Ambulatory Visit: Payer: Self-pay

## 2021-10-31 VITALS — BP 118/77 | HR 89 | Ht 65.0 in | Wt 306.0 lb

## 2021-10-31 DIAGNOSIS — N83209 Unspecified ovarian cyst, unspecified side: Secondary | ICD-10-CM

## 2021-10-31 DIAGNOSIS — K429 Umbilical hernia without obstruction or gangrene: Secondary | ICD-10-CM | POA: Insufficient documentation

## 2021-10-31 MED ORDER — ALLOPURINOL 100 MG PO TABS
100.0000 mg | ORAL_TABLET | Freq: Every day | ORAL | 2 refills | Status: DC
  Filled 2021-10-31: qty 30, 30d supply, fill #0
  Filled 2022-02-01: qty 30, 30d supply, fill #1
  Filled 2022-03-03: qty 30, 30d supply, fill #2

## 2021-10-31 MED ORDER — METOPROLOL TARTRATE 50 MG PO TABS
50.0000 mg | ORAL_TABLET | Freq: Two times a day (BID) | ORAL | 0 refills | Status: DC
  Filled 2021-10-31: qty 60, 30d supply, fill #0

## 2021-10-31 MED ORDER — LISINOPRIL 40 MG PO TABS
ORAL_TABLET | ORAL | 1 refills | Status: AC
  Filled 2021-10-31 – 2022-01-22 (×3): qty 90, 90d supply, fill #0
  Filled 2022-05-17: qty 90, 90d supply, fill #1

## 2021-10-31 MED ORDER — POTASSIUM CHLORIDE CRYS ER 20 MEQ PO TBCR
EXTENDED_RELEASE_TABLET | ORAL | 2 refills | Status: AC
  Filled 2021-10-31: qty 60, 30d supply, fill #0
  Filled 2022-02-01: qty 60, 30d supply, fill #1
  Filled 2022-03-03: qty 60, 30d supply, fill #2

## 2021-10-31 MED ORDER — ATORVASTATIN CALCIUM 10 MG PO TABS
ORAL_TABLET | ORAL | 5 refills | Status: DC
  Filled 2021-10-31: qty 30, 30d supply, fill #0

## 2021-10-31 MED ORDER — DULOXETINE HCL 20 MG PO CPEP
ORAL_CAPSULE | ORAL | 0 refills | Status: DC
  Filled 2021-10-31: qty 30, 10d supply, fill #0

## 2021-10-31 MED ORDER — VENTOLIN HFA 108 (90 BASE) MCG/ACT IN AERS
INHALATION_SPRAY | RESPIRATORY_TRACT | 3 refills | Status: AC
  Filled 2021-10-31: qty 18, 25d supply, fill #0
  Filled 2022-03-03: qty 18, 16d supply, fill #1
  Filled 2022-08-15: qty 18, 17d supply, fill #2

## 2021-11-01 ENCOUNTER — Other Ambulatory Visit: Payer: Self-pay

## 2021-11-01 ENCOUNTER — Other Ambulatory Visit (HOSPITAL_COMMUNITY): Payer: Self-pay

## 2021-11-01 ENCOUNTER — Inpatient Hospital Stay
Payer: Commercial Managed Care - POS | Attending: Sports Medicine | Admitting: Rehabilitative and Restorative Service Providers"

## 2021-11-01 DIAGNOSIS — M25521 Pain in right elbow: Secondary | ICD-10-CM | POA: Insufficient documentation

## 2021-11-01 NOTE — PT/OT Therapy Note (Addendum)
Name: Elizabeth Dougherty Age: 56 y.o.   Date of Service: 11/01/2021  Referring Physician: Schuyler Amor, DO   Date of Injury: 07/29/2021  Date Care Plan Established/Reviewed: 10/30/2021  Date Treatment Started: 10/30/2021  Date Care Plan Established/Reviewed No data was found  Date Treatment Started No data was found  (Historic) Date Care Plan Established/Reviewed No data was found  (Historic) Date Treatment Started No data was found   End of Certification Date: 01/27/2022  Sessions in Plan of Care: 12  Surgery Date: No data was found  MD Follow-up: No data was found  Medbridge Code: No data was found    Visit Count: 2   Diagnosis:   1. Pain in right elbow      Discontinuation of Therapy Services    Elizabeth Dougherty did not complete prescribed physical therapy visits.      Status is unknown at this time, physical therapy has been discontinued and patient has been discharged from care.  The last therapy note is below for review.    Please feel free to contact me with any questions regarding the care of Elizabeth Dougherty.    Sincerely,    Payton Mccallum, DPT        12/26/2021            Subjective     Social Support/Occupation              Occupation: Furniture conservator/restorer      Precautions: No data was found  Allergies: Patient has no known allergies.    Objective                     Treatment     Therapeutic Exercises - Justified to address any of the following:  To develop strength, endurance, ROM and/or flexibility.   Manually assisted wrist flexion stretch    Wrist flexion stretch with ulnar deviation bias 3 x 30"    Seated wrist extension with slow eccentric 2# 2 x 10, 3# x10    Bent over row 6kg x10  Bicep curl 5# x10  B OH press 5# x10  TRX row x10  Downward dog  Full plank x10    Provided HEP as shown below    Manual Therapy - Justified to address any of the following:    Mobilization of joints and soft tissues, manipulation, manual lymphatic drainage, and/or manual traction.    CFM to common extensor tendon  STM to  wrist extensors    Home Exercises   Access Code: [email or web address]  URL: [email or web address]  Date: 11/01/2021  Prepared by: Rexanne Mano    Exercises  - Seated Wrist Flexion Stretch  - 1 x daily - 7 x weekly - 1 sets - 3 reps - 30 hold  - Seated Eccentric Wrist Extension  - 1 x daily - 7 x weekly - 3 sets - 10 reps       ---      Flowsheet Row ---   Total Time    Timed Minutes 44 minutes   Total Time 44 minutes          Assessment   Patient's symptoms have significantly decreased since the initial onset and she was able to tolerate relatively low load UE movements well today. I encouraged patient to return to yoga and complete HEP daily. She may be able to return to [facility] workouts pending her tolerance to more wrist/hand demanding tasks completed in PT appointments. Recommended she  trial a tennis elbow brace for workouts and maintain symptoms low to medium at most, otherwise she should modify the exercises.   Plan   F/u on HEP and yoga workouts  Trial more grip demanding tasks for clearance back to [facility] when appropriate      Goals      Goal 1: Patient will 5/5 on R EFT to facilitate painfree push/ pull activities during heavy chores.       Sessions: 12     Goal 2: Patient will demonstrate painfree R wrist extension MMT 5/5 with elbow straight to facilitate painfree gripping activities   Sessions: 12     Goal 3: Paitent will demonstrate independence and compliance with HEP for self management of symptoms.       Sessions: 12     Goal 4: Pt will demonstrate compliance with positioning techniques to increase restful sleep and ease OOB transition in am.    Sessions: 12                                                Payton Mccallum, DPT

## 2021-11-01 NOTE — Progress Notes (Signed)
Ms Burkhammer presents for GYN U/S follow up. ?U/S results reviewed with pt. ? ?Pt reports that she has an umbilical hernia which causing her some pain. PCP recommended discussing with me and potential for surgery. ? ?PE AF VSS ?Lungs clear Heart RRR ?Abd soft, BS, obese, hernia, no obstruction noted ?GU deferred ? ?A/P Ovarian mass ?        Hernia ? ?Tx options for ovarian mass reviewed. Low suspicious for malignancy at this time. Case has been reviewed with Gyn Onc who agrees. Will refer to general surgery for eval of her hernia. If surgery recommended for hernia, will try to coordinate LSO at the same time. If surgery is not recommended, pt desires conservative management with close surveillance. ?F/U per surgery consult. ? ? ?

## 2021-11-02 ENCOUNTER — Other Ambulatory Visit: Payer: Self-pay

## 2021-11-02 ENCOUNTER — Other Ambulatory Visit (HOSPITAL_COMMUNITY): Payer: Self-pay

## 2021-11-02 MED ORDER — BUSPIRONE HCL 5 MG PO TABS
5.0000 mg | ORAL_TABLET | Freq: Two times a day (BID) | ORAL | 3 refills | Status: AC
  Filled 2021-11-02: qty 60, 30d supply, fill #0
  Filled 2022-02-01: qty 60, 30d supply, fill #1
  Filled 2022-03-03 – 2022-03-05 (×2): qty 30, 15d supply, fill #2
  Filled 2022-05-17: qty 60, 30d supply, fill #2
  Filled 2022-08-15: qty 60, 30d supply, fill #3

## 2021-11-05 ENCOUNTER — Other Ambulatory Visit: Payer: Self-pay

## 2021-11-05 MED ORDER — DULOXETINE HCL 60 MG PO CPEP
60.0000 mg | ORAL_CAPSULE | Freq: Every day | ORAL | 2 refills | Status: AC
  Filled 2021-11-05: qty 30, 30d supply, fill #0
  Filled 2021-12-19: qty 30, 30d supply, fill #1
  Filled 2022-01-22: qty 30, 30d supply, fill #2

## 2021-11-06 ENCOUNTER — Other Ambulatory Visit: Payer: Self-pay

## 2021-11-12 ENCOUNTER — Ambulatory Visit: Payer: Medicaid Other | Admitting: Pulmonary Disease

## 2021-11-14 ENCOUNTER — Encounter: Payer: Self-pay | Admitting: Obstetrics and Gynecology

## 2021-11-14 ENCOUNTER — Inpatient Hospital Stay: Payer: Commercial Managed Care - POS | Admitting: Rehabilitative and Restorative Service Providers"

## 2021-11-14 ENCOUNTER — Inpatient Hospital Stay: Payer: Commercial Managed Care - POS

## 2021-11-15 ENCOUNTER — Encounter: Payer: Self-pay | Admitting: *Deleted

## 2021-11-20 ENCOUNTER — Inpatient Hospital Stay: Payer: Commercial Managed Care - POS

## 2021-11-22 ENCOUNTER — Inpatient Hospital Stay: Payer: Commercial Managed Care - POS

## 2021-11-27 ENCOUNTER — Inpatient Hospital Stay: Payer: Commercial Managed Care - POS

## 2021-11-29 ENCOUNTER — Inpatient Hospital Stay: Payer: Commercial Managed Care - POS

## 2021-11-30 ENCOUNTER — Encounter: Payer: Self-pay | Admitting: Pulmonary Disease

## 2021-11-30 ENCOUNTER — Ambulatory Visit (INDEPENDENT_AMBULATORY_CARE_PROVIDER_SITE_OTHER): Payer: Medicaid Other | Admitting: Pulmonary Disease

## 2021-11-30 ENCOUNTER — Other Ambulatory Visit: Payer: Self-pay

## 2021-11-30 VITALS — BP 130/84 | HR 101 | Ht 65.0 in | Wt 303.8 lb

## 2021-11-30 DIAGNOSIS — J4550 Severe persistent asthma, uncomplicated: Secondary | ICD-10-CM

## 2021-11-30 DIAGNOSIS — E662 Morbid (severe) obesity with alveolar hypoventilation: Secondary | ICD-10-CM | POA: Diagnosis not present

## 2021-11-30 NOTE — Progress Notes (Signed)
[facility]  Chief Complaint  Patient presents with   Follow-up    Interested in [place]    Past Surgical History:  She  has a past surgical history that includes Cesarean section; Cholecystectomy; Abdominal hysterectomy (2008); and Thrombectomy w/ embolectomy (Right, 08/23/2020).  Past Medical History:  Anemia, Anxiety, OA, Headaches, Depression, Diverticulosis, Fibromyalgia, Gallstones, GERD, HTN, Pneumonia, Rt leg DVT 1994, Asthma, COVID 19 pneumonia January 2022, Ankylosing spondylitis  Constitutional:  BP 130/84 (BP Location: Left Wrist, Cuff Size: Normal)   Pulse (!) 101   Ht '5\' 5"'$  (1.651 m)   Wt (!) 303 lb 12.8 oz (137.8 kg)   SpO2 96%   BMI 50.55 kg/m   Brief Summary:  Theresa Pearson is a 56 y.o. female with asthma, and hypoxia after having COVID 19 pneumonia in January 2022.      Subjective:   Theresa Pearson from February showed low oxygen level.  She was to be set up with 2 liters at night in addition to 2 liters with exertion.  She was seen by cardiology in February.  Her heart rate still goes up with activity, but she recovers after resting for several minutes.  She has more allergies and sinus congestion.  She has been getting dizzy when she moves her head to the left, and feels like things are spinning.  This lasts for a few minutes and then gets better.  She has been using albuterol a few times per week, and this helps.    She received a letter from her landlord recently saying they weren't going to renew her lease, and that she has to vacant her home by August.  She isn't sure where she will end up living.  Physical Exam:   Appearance - well kempt, wearing oxygen  ENMT - no sinus tenderness, no oral exudate, no LAN, Mallampati 3 airway, no stridor  Respiratory - equal breath sounds bilaterally, no wheezing or rales  CV - s1s2 regular rate and rhythm, no murmurs  Ext - no clubbing, no edema  Skin - no rashes  Psych -  normal mood and affect        Pulmonary testing:  Spirometry 01/06/21 >> FEV1 1.95 (83%), FEV1% 85 PFT 05/21/21 >> FEV1 2.27 (97%), FEV1% 92, TLC 3.65 (70%), DLCO 101%  Chest Imaging:  CT chest 08/08/20 >> peripheral predominant GGO typical of COVID 19 pneumonia CT angio chest 08/30/20 >> ATX at bases  Sleep Tests:  PSG 10/31/15 >> AHI 2.5, SpO2 low 82% PSG 02/20/21 >> AHI 0.3, SpO2 low 95%.  Used 2 liters O2. ONO with RA 09/05/21 >> test time 6 hrs 6 min.  Baseline SpO2 88%, low SpO2 74%.  Spent 3 hrs 44 min with SpO2 < 88%.  Cardiac Tests:  Echo bubble 08/24/20 >> EF 65 to 70%, mild RA dilation, no shunt  Social History:  She  reports that she has never smoked. She has never used smokeless tobacco. She reports that she does not currently use alcohol after a past usage of about 1.0 - 2.0 standard drink per week. She reports that she does not use drugs.  Family History:  Her family history includes Allergy (severe) in her sister and son; CAD in her mother; Colon polyps in her mother and sister; Crohn's disease in her mother; Diabetes in her maternal grandmother; Diabetes Mellitus II in her mother; Heart attack in her maternal grandmother; Hypertension in her maternal grandmother; Prostate cancer in her maternal grandfather; Stroke in  her mother.     Assessment/Plan:   Severe, persistent asthma. - continue symbicort 160 two puffs bid and singulair 10 mg qhs - prn albuterol  Obesity hypoventilation syndrome. - uses [employer] - 2 liters oxygen with exertion and sleep - will see if she can get a POC if her insurance will allow this; she is hopefully that the DME her sister uses in [place] can help her with this >> will have [facility] look into this  Benign positional vertigo. - likely related to allergies - discussed habituating exercises for her to do - if this persists, then she needs further assessment with her PCP  Dyspnea on exertion. - related to obesity and deconditioning, and  asthma  Time Spent Involved in Patient Care on Day of Examination:  28 minutes  Follow up:   Patient Instructions  Follow up in 6 months  Medication List:   Allergies as of 11/30/2021       Reactions   Penicillins Shortness Of Breath, Rash        Medication List        Accurate as of Nov 30, 2021  4:07 PM. If you have any questions, ask your nurse or doctor.          acetaminophen 500 MG tablet Commonly known as: TYLENOL Take 500 mg by mouth every 6 (six) hours as needed for mild pain.   allopurinol 100 MG tablet Commonly known as: ZYLOPRIM Take 1 tablet by mouth daily.   allopurinol 100 MG tablet Commonly known as: ZYLOPRIM Take 1 tablet by mouth daily.   atorvastatin 10 MG tablet Commonly known as: LIPITOR 1 tablet orally every evening   atorvastatin 10 MG tablet Commonly known as: LIPITOR Take 1 tablet by mouth every evening   busPIRone 5 MG tablet Commonly known as: BUSPAR Take 1 tablet (5 mg total) by mouth 2 (two) times daily.   chlorthalidone 50 MG tablet Commonly known as: HYGROTON Take 1 tablet (50 mg total) by mouth in the morning with food   cholecalciferol 25 MCG (1000 UNIT) tablet Commonly known as: VITAMIN D3 Take 1,000 Units by mouth daily.   diclofenac sodium 1 % Gel Commonly known as: Voltaren Apply 2 g topically 4 (four) times daily. What changed:  when to take this reasons to take this   dicyclomine 10 MG capsule Commonly known as: BENTYL Take 1-2 capsules (10-20 mg total) by mouth every 8 (eight) hours as needed for spasms.   DULoxetine 60 MG capsule Commonly known as: CYMBALTA Take 1 capsule by mouth daily.   DULoxetine 60 MG capsule Commonly known as: CYMBALTA Take 1 capsule by mouth daily.   Eliquis 5 MG Tabs tablet Generic drug: apixaban Take 1 tablet (5 mg total) by mouth 2 (two) times daily.   ferrous sulfate 325 (65 FE) MG tablet TAKE 1 TABLET (325 MG TOTAL) BY MOUTH DAILY WITH BREAKFAST.   gabapentin  300 MG capsule Commonly known as: NEURONTIN Take 1 capsule (300 mg total) by mouth at bedtime for 3 nights. Then increase to 1 capsule two times daily.   lisinopril 40 MG tablet Commonly known as: ZESTRIL Take 1 tablet by mouth daily   metoprolol tartrate 50 MG tablet Commonly known as: LOPRESSOR Take 1 tablet by mouth twice daily with food   montelukast 10 MG tablet Commonly known as: SINGULAIR Take 1 tablet (10 mg total) by mouth at bedtime.   MULTIVITAMIN PO Take 1 tablet by mouth daily.   potassium chloride SA 20  MEQ tablet Commonly known as: KLOR-CON M Take 1 tablet by mouth twice daily with food   potassium chloride SA 20 MEQ tablet Commonly known as: KLOR-CON M Take 1 tablet by mouth twice daily with food   Symbicort 160-4.5 MCG/ACT inhaler Generic drug: budesonide-formoterol Inhale 2 puffs into the lungs in the morning and at bedtime.   Ventolin HFA 108 (90 Base) MCG/ACT inhaler Generic drug: albuterol INHALE 2 PUFFS INTO THE LUNGS EVERY 6 (SIX) HOURS AS NEEDED FOR WHEEZING OR SHORTNESS OF BREATH.   Ventolin HFA 108 (90 Base) MCG/ACT inhaler Generic drug: albuterol Inhale 2 puffs by mouth every 4-6 hours as needed   vitamin B-12 1000 MCG tablet Commonly known as: CYANOCOBALAMIN Take 1,000 mcg by mouth daily.   zinc gluconate 50 MG tablet Take 50 mg by mouth daily.        Signature:  Chesley Mires, MD [place] Pager - [phone] 11/30/2021, 4:07 PM

## 2021-11-30 NOTE — Patient Instructions (Signed)
Follow up in 6 months 

## 2021-12-04 ENCOUNTER — Inpatient Hospital Stay: Payer: Commercial Managed Care - POS

## 2021-12-06 ENCOUNTER — Telehealth: Payer: Self-pay | Admitting: Pulmonary Disease

## 2021-12-06 ENCOUNTER — Inpatient Hospital Stay: Payer: Commercial Managed Care - POS

## 2021-12-07 ENCOUNTER — Encounter (INDEPENDENT_AMBULATORY_CARE_PROVIDER_SITE_OTHER): Payer: Self-pay | Admitting: Obstetrics & Gynecology

## 2021-12-07 ENCOUNTER — Ambulatory Visit (INDEPENDENT_AMBULATORY_CARE_PROVIDER_SITE_OTHER): Payer: Commercial Managed Care - POS | Admitting: Obstetrics & Gynecology

## 2021-12-07 VITALS — BP 100/60 | Ht 64.0 in | Wt 134.0 lb

## 2021-12-07 DIAGNOSIS — Z124 Encounter for screening for malignant neoplasm of cervix: Secondary | ICD-10-CM

## 2021-12-07 DIAGNOSIS — Z1151 Encounter for screening for human papillomavirus (HPV): Secondary | ICD-10-CM

## 2021-12-07 DIAGNOSIS — Z1239 Encounter for other screening for malignant neoplasm of breast: Secondary | ICD-10-CM

## 2021-12-07 DIAGNOSIS — Z01419 Encounter for gynecological examination (general) (routine) without abnormal findings: Secondary | ICD-10-CM

## 2021-12-07 DIAGNOSIS — Z1212 Encounter for screening for malignant neoplasm of rectum: Secondary | ICD-10-CM

## 2021-12-07 DIAGNOSIS — N949 Unspecified condition associated with female genital organs and menstrual cycle: Secondary | ICD-10-CM

## 2021-12-07 DIAGNOSIS — Z1231 Encounter for screening mammogram for malignant neoplasm of breast: Secondary | ICD-10-CM

## 2021-12-07 LAB — POCT OCCULT BLOOD STOOL: Stool Occult Blood: NEGATIVE

## 2021-12-07 NOTE — Progress Notes (Signed)
Subjective:       Elizabeth Dougherty is a 56 y.o. female here for a routine exam.  Current complaints: none.  Personal health questionnaire reviewed: yes. Pt denies any VB, d/c, dysuria, or dyspareunia. Nl BM's. No incontinence. Older daughter finished her freshman year at [employer] for [employer] in [place], younger daughter is in 11th grade, and son is in 7th. 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  Contraception: none  Breast self exam: discussed  Common GYN tests  Last Pap Date: 11/27/18  Last Pap Result: Normal (neg HPV)  Last Mammo Date: 02/18/21  Last Mammo Result: Normal  Last Colonoscopy Date: 07/03/21  Last Colonoscopy Result: Normal (neg Cologuard; recheck due 2025)  Last Dexa Date:  (Not due)  Last Dexa Result: N/A    The following portions of the patient's history were reviewed and updated as appropriate: allergies, current medications, past family history, past medical history, past social history, past surgical history and problem list.      Review of Systems  Pertinent items are noted in HPI.      Objective:      BP 100/60   Ht 5\' 4"  (1.626 m)   Wt 134 lb (60.8 kg)   LMP 11/12/2017   BMI 23.00 kg/m   General appearance: alert, appears stated age and cooperative  Neck: no adenopathy, supple, symmetrical, trachea midline, and thyroid not enlarged, symmetric, no tenderness/mass/nodules  Breasts: normal appearance, no masses or tenderness, No nipple retraction or dimpling, No nipple discharge or bleeding, No axillary or supraclavicular adenopathy  Abdomen: soft, non-tender; bowel sounds normal; no masses,  no organomegaly  Pelvic exam:     Urinary system: urethral meatus normal   External genitalia: normal general appearance   Vaginal: normal rugae   Cervix: normal appearance   Adnexa: normal bimanual exam   Uterus: normal single, nontender   Rectal: good sphincter tone, no masses, and guaiac negative           Assessment:      Healthy female exam.        Plan:      Mammogram ordered.  Follow up in: 1 year.  Thin prep pap/HPV Yes  Pelvic sono ordered to f/u exophytic cyst.

## 2021-12-07 NOTE — Telephone Encounter (Signed)
Community message sent to [employer] contacts  Called and notified patient that it would be taken care of. Nothing further needed.

## 2021-12-12 ENCOUNTER — Inpatient Hospital Stay: Payer: Commercial Managed Care - POS

## 2021-12-13 ENCOUNTER — Telehealth: Payer: Self-pay

## 2021-12-13 ENCOUNTER — Other Ambulatory Visit: Payer: Self-pay

## 2021-12-13 DIAGNOSIS — Z7901 Long term (current) use of anticoagulants: Secondary | ICD-10-CM

## 2021-12-13 DIAGNOSIS — Z1211 Encounter for screening for malignant neoplasm of colon: Secondary | ICD-10-CM

## 2021-12-13 DIAGNOSIS — R1013 Epigastric pain: Secondary | ICD-10-CM

## 2021-12-13 DIAGNOSIS — K625 Hemorrhage of anus and rectum: Secondary | ICD-10-CM

## 2021-12-13 NOTE — Progress Notes (Signed)
Orders for ECL at [facility] on 8-3 w/ Dr. Havery Moros

## 2021-12-13 NOTE — Telephone Encounter (Signed)
Called patient on cell phone but was unable to leave a message as it went to "busy". Called patient at home number but she did not answer and the voice mail box has not been set up yet.  Sent patient MyChart message re: procedure on 8-3.

## 2021-12-16 ENCOUNTER — Telehealth: Payer: Self-pay

## 2021-12-16 NOTE — Telephone Encounter (Signed)
Patient's phone is currently out of service. Patient confirmed appointment via [place]. She has been scheduled for ECL and PV.  Letter requesting approval to hold Eliquis for 2 days has been sent to Cardiology.

## 2021-12-16 NOTE — Telephone Encounter (Signed)
[facility] Pre-operative Risk Assessment     Request for surgical clearance:     Endoscopy Procedure  What type of surgery is being performed?   EGD/COLONOSCOPY  When is this surgery scheduled?     02-14-2022  What type of clearance is required ?   Pharmacy   Are there any medications that need to be held prior to surgery and how long? ELIQUIS 2 DAYS  Practice name and name of physician performing surgery?      [facility]  DR Clarkton Cellar  What is your office phone and fax number?      Phone- [phone]  Fax- [place], CMA  Anesthesia type (None, local, MAC, general) ?       MAC   THANK YOU

## 2021-12-17 NOTE — Telephone Encounter (Signed)
Eliquis is not being managed by cardiology service, will defer to the prescribing provider.  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

## 2021-12-18 LAB — IMAGE GUIDED PAP WITH APTIMA HPV TP
.: 0
HPV APTIMA: NEGATIVE

## 2021-12-19 ENCOUNTER — Other Ambulatory Visit: Payer: Self-pay

## 2021-12-20 NOTE — Progress Notes (Signed)
Unsatisfactory Pap with negative HR HPV. Repeat Pap in 1 year.

## 2021-12-20 NOTE — Telephone Encounter (Signed)
Called [facility] at [phone]. Left a detailed message that we need to obtain clearance for patient to hold Eliquis prior to her procedure in August. Their fax number is [phone].  Clearance request to hold Eliquis faxed

## 2021-12-20 NOTE — Telephone Encounter (Signed)
12/20/2021     RE:      Theresa Pearson DOB:   08/02/1965 MRN:   [id number]   Dear Dagoberto Ligas, PA-C,    We have scheduled the above named patient for a Colonoscopy procedure. Our records show that she is on anticoagulation therapy.   Please advise as to whether the patient may come off of her therapy of ELIQUIS 2 days prior to their procedure which is scheduled for 02-14-2022.   Please route your response to [facility], CMA or fax response to [phone]. If you need to call us to discuss, our office number is [phone]. Thank you for your prompt reply.   Sincerely,       [facility]

## 2021-12-21 ENCOUNTER — Telehealth: Payer: Self-pay

## 2021-12-21 NOTE — Telephone Encounter (Signed)
Call received from [facility], to confirm receipt of LOV and labwork sent securely via email to [facility].  Confirmed documents were received, printed, and placed in provider's box. All questions answered.  Binnie Kand, RN

## 2021-12-24 ENCOUNTER — Telehealth: Payer: Self-pay | Admitting: Pulmonary Disease

## 2021-12-24 DIAGNOSIS — Z8616 Personal history of COVID-19: Secondary | ICD-10-CM

## 2021-12-24 DIAGNOSIS — J4550 Severe persistent asthma, uncomplicated: Secondary | ICD-10-CM

## 2021-12-24 DIAGNOSIS — J9601 Acute respiratory failure with hypoxia: Secondary | ICD-10-CM

## 2021-12-24 NOTE — Telephone Encounter (Signed)
Called and spoke with patient who states that [employer] needs order for POC and it needs to be faxed to [phone]. Order has been placed. Nothing further needed at this time,

## 2021-12-25 ENCOUNTER — Encounter: Payer: Self-pay | Admitting: Internal Medicine

## 2021-12-25 ENCOUNTER — Other Ambulatory Visit: Payer: Self-pay

## 2021-12-25 ENCOUNTER — Ambulatory Visit (INDEPENDENT_AMBULATORY_CARE_PROVIDER_SITE_OTHER): Payer: Medicaid Other | Admitting: Internal Medicine

## 2021-12-25 DIAGNOSIS — J45901 Unspecified asthma with (acute) exacerbation: Secondary | ICD-10-CM

## 2021-12-25 DIAGNOSIS — M459 Ankylosing spondylitis of unspecified sites in spine: Secondary | ICD-10-CM | POA: Diagnosis not present

## 2021-12-25 DIAGNOSIS — F32A Depression, unspecified: Secondary | ICD-10-CM

## 2021-12-25 DIAGNOSIS — R011 Cardiac murmur, unspecified: Secondary | ICD-10-CM

## 2021-12-25 DIAGNOSIS — E785 Hyperlipidemia, unspecified: Secondary | ICD-10-CM | POA: Insufficient documentation

## 2021-12-25 DIAGNOSIS — J45909 Unspecified asthma, uncomplicated: Secondary | ICD-10-CM | POA: Insufficient documentation

## 2021-12-25 NOTE — Progress Notes (Signed)
[facility]  Reason for Consult: Intermittent leukocytosis Referring Provider: Dianna Rossetti, NP  Assessment: The cause of her mild, intermittent leukocytosis is unclear.  She has no evidence of chronic infection or autoimmune disorder other than ankylosing spondylitis.  I doubt that the leukocytosis is contributing to any current symptoms she is having and I do not feel that she needs further work-up or evaluation for this.  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   ALBUTEROL (VENTOLIN HFA) 108 (90 BASE) MCG/ACT INHALER    INHALE 2 PUFFS INTO THE LUNGS EVERY 6 (SIX) HOURS AS NEEDED FOR WHEEZING OR SHORTNESS OF BREATH.   ALLOPURINOL (ZYLOPRIM) 100 MG TABLET    Take 1 tablet by mouth daily.   ALLOPURINOL (ZYLOPRIM) 100 MG TABLET    Take 1 tablet by mouth daily.   APIXABAN (ELIQUIS) 5 MG TABS TABLET    Take 1 tablet (5 mg total) by mouth 2 (two) times daily.   ATORVASTATIN (LIPITOR) 10 MG TABLET    1 tablet orally every evening   ATORVASTATIN (LIPITOR) 10 MG TABLET    Take 1 tablet by mouth every evening   BUDESONIDE-FORMOTEROL (SYMBICORT) 160-4.5  MCG/ACT INHALER    Inhale 2 puffs into the lungs in the morning and at bedtime.   BUSPIRONE (BUSPAR) 5 MG TABLET    Take 1 tablet (5 mg total) by mouth 2 (two) times daily.   CHLORTHALIDONE (HYGROTON) 50 MG TABLET    Take 1 tablet (50 mg total) by mouth in the morning with food   CHOLECALCIFEROL (VITAMIN D3) 25 MCG (1000 UNIT) TABLET    Take 1,000 Units by mouth daily.   DICLOFENAC SODIUM (VOLTAREN) 1 % GEL    Apply 2 g topically 4 (four) times daily.   DICYCLOMINE (BENTYL) 10 MG CAPSULE    Take 1-2 capsules (10-20 mg total) by mouth every 8 (eight) hours as needed for spasms.   DULOXETINE (CYMBALTA) 60 MG CAPSULE    Take 1 capsule by mouth daily.   DULOXETINE (CYMBALTA) 60 MG CAPSULE    Take 1 capsule by mouth daily.   FERROUS SULFATE 325 (65 FE) MG TABLET    TAKE 1 TABLET (325 MG TOTAL) BY MOUTH DAILY WITH BREAKFAST.   GABAPENTIN (NEURONTIN) 300 MG CAPSULE    Take 1 capsule (300 mg total) by mouth at bedtime for 3 nights. Then increase to 1 capsule two times daily.   LISINOPRIL (ZESTRIL) 40 MG TABLET    Take 1 tablet by mouth daily   METOPROLOL TARTRATE (LOPRESSOR) 50 MG TABLET    Take 1 tablet by mouth twice daily  with food   MONTELUKAST (SINGULAIR) 10 MG TABLET    Take 1 tablet (10 mg total) by mouth at bedtime.   MULTIPLE VITAMINS-MINERALS (MULTIVITAMIN PO)    Take 1 tablet by mouth daily.   POTASSIUM CHLORIDE SA (KLOR-CON M) 20 MEQ TABLET    Take 1 tablet by mouth twice daily with food   POTASSIUM CHLORIDE SA (KLOR-CON M) 20 MEQ TABLET    Take 1 tablet by mouth twice daily with food   VENTOLIN HFA 108 (90 BASE) MCG/ACT INHALER    Inhale 2 puffs by mouth every 4-6 hours as needed   VITAMIN B-12 (CYANOCOBALAMIN) 1000 MCG TABLET    Take 1,000 mcg by mouth daily.   ZINC GLUCONATE 50 MG TABLET    Take 50 mg by mouth daily.  Modified Medications   No medications on file  Discontinued Medications   No medications on file    HPI: Theresa Pearson is a 56 y.o. female with multiple medical  problems including ankylosing spondylitis, asthma, oxygen dependent chronic respiratory insufficiency following COVID infection in January 2022 and microcytic anemia who is referred for the evaluation of leukocytosis.  Based on data going back 10 years she has had intermittent leukocytosis noted in about 50% of the CBCs that have been done.  On occasion she will be noted to have a predominance of neutrophils but no other abnormalities on differential.  It does not appear that she is iron deficient.  She has hemoglobin A with no other variance noted.  She says that she has required intermittent dosing with prednisone over the past several years for her ankylosing spondylitis.  I do not have any records to indicate if she was on steroids at the times that she has been noted to have leukocytosis.  However she was noted to have a slightly elevated white blood count well 0.6 last month when she was not on steroids.  Review of Systems: Review of Systems  Constitutional:  Negative for fever and weight loss.  Respiratory:  Positive for shortness of breath. Negative for cough.   Cardiovascular:  Negative for chest pain.  Musculoskeletal:  Positive for back pain.  Skin:  Negative for rash.      Past Medical History:  Diagnosis Date   Anemia    Ankylosing spondylitis (HCC)    Anxiety    Arthritis    Asthma    Chronic headaches    Depression    Diverticulosis    Fibromyalgia    Gallstones    GERD (gastroesophageal reflux disease)    Hypertension    Pneumonia due to COVID-19 virus 07/2020   Right leg DVT (HCC)    Tachycardia     Social History   Tobacco Use   Smoking status: Never   Smokeless tobacco: Never  Vaping Use   Vaping Use: Never used  Substance Use Topics   Alcohol use: Not Currently    Alcohol/week: 1.0 - 2.0 standard drink of alcohol    Types: 1 - 2 Glasses of wine per week    Comment: occoasional.   Drug use: No    Family History  Problem Relation Age of Onset   CAD  Mother    Diabetes Mellitus II Mother    Stroke Mother    Colon polyps Mother    Crohn's disease Mother    Allergy (severe) Sister    Colon polyps Sister    Allergy (severe) Son    Prostate cancer Maternal Grandfather    Diabetes Maternal  Grandmother    Hypertension Maternal Grandmother    Heart attack Maternal Grandmother    Allergies  Allergen Reactions   Penicillins Shortness Of Breath and Rash    OBJECTIVE: There were no vitals filed for this visit. There is no height or weight on file to calculate BMI.   Physical Exam Constitutional:      Comments: She is very pleasant and in no distress.  Cardiovascular:     Rate and Rhythm: Normal rate and regular rhythm.     Heart sounds: Murmur heard.     Comments: Early systolic murmur. Pulmonary:     Effort: Pulmonary effort is normal.     Breath sounds: Normal breath sounds.     Comments: She is using supplemental, nasal prong oxygen Psychiatric:        Mood and Affect: Mood normal.     Microbiology: No results found for this or any previous visit (from the past 240 hour(s)).  Michel Bickers, MD [facility] [phone] pager   [phone] cell 12/25/2021, 2:20 PM

## 2021-12-26 ENCOUNTER — Telehealth: Payer: Self-pay

## 2021-12-26 NOTE — Telephone Encounter (Signed)
Pt is cleared to stop Eliquis 2 days prior to scheduled colonoscopy on 02/14/22. If anything additional is needed, please contact our office.

## 2021-12-27 ENCOUNTER — Telehealth: Payer: Self-pay

## 2021-12-27 NOTE — Telephone Encounter (Signed)
-----   Message from Gevena Mart, RN sent at 12/26/2021  4:45 PM EDT ----- Regarding: Colonoscopy Med Hold Pt approved to hold Eliquis for colonoscopy on 02/14/22.

## 2021-12-27 NOTE — Telephone Encounter (Signed)
I informed Theresa Pearson that we got approval to hold her Eliquis  2 days prior to colonoscopy. She verbalized understanding.

## 2021-12-31 ENCOUNTER — Telehealth: Payer: Self-pay | Admitting: Pulmonary Disease

## 2021-12-31 DIAGNOSIS — J9601 Acute respiratory failure with hypoxia: Secondary | ICD-10-CM

## 2021-12-31 DIAGNOSIS — Z8616 Personal history of COVID-19: Secondary | ICD-10-CM

## 2021-12-31 DIAGNOSIS — J4550 Severe persistent asthma, uncomplicated: Secondary | ICD-10-CM

## 2022-01-01 ENCOUNTER — Ambulatory Visit: Payer: Medicaid Other | Admitting: Physical Therapy

## 2022-01-02 NOTE — Telephone Encounter (Signed)
New order for POC has been placed stating for pt to be evaluated for POC. Nothing further needed.

## 2022-01-09 ENCOUNTER — Other Ambulatory Visit (HOSPITAL_COMMUNITY): Payer: Self-pay

## 2022-01-22 ENCOUNTER — Ambulatory Visit: Payer: Medicaid Other | Attending: Surgery | Admitting: Physical Therapy

## 2022-01-22 ENCOUNTER — Other Ambulatory Visit (HOSPITAL_COMMUNITY): Payer: Self-pay

## 2022-01-28 ENCOUNTER — Other Ambulatory Visit: Payer: Self-pay

## 2022-01-28 ENCOUNTER — Ambulatory Visit (AMBULATORY_SURGERY_CENTER): Payer: Self-pay | Admitting: *Deleted

## 2022-01-28 VITALS — Ht 65.0 in | Wt 298.0 lb

## 2022-01-28 DIAGNOSIS — K625 Hemorrhage of anus and rectum: Secondary | ICD-10-CM

## 2022-01-28 DIAGNOSIS — R1013 Epigastric pain: Secondary | ICD-10-CM

## 2022-01-28 MED ORDER — PLENVU 140 G PO SOLR
ORAL | 0 refills | Status: DC
  Filled 2022-01-28: qty 3, 1d supply, fill #0

## 2022-01-28 NOTE — Progress Notes (Signed)
No egg or soy allergy known to patient  No issues known to pt with past sedation with any surgeries or procedures Patient denies ever being told they had issues or difficulty with intubation  No FH of Malignant Hyperthermia Pt is not on diet pills Pt is on  home 02 @ 2L VIA N/C Pt is not on blood thinners  Pt denies issues with constipation  No A fib or A flutter Have any cardiac testing pending-- Pt instructed to use [email or web address] or GoodRx for a price reduction on prep

## 2022-02-01 ENCOUNTER — Other Ambulatory Visit: Payer: Self-pay | Admitting: Physician Assistant

## 2022-02-01 ENCOUNTER — Other Ambulatory Visit (HOSPITAL_COMMUNITY): Payer: Self-pay

## 2022-02-01 MED ORDER — APIXABAN 5 MG PO TABS
5.0000 mg | ORAL_TABLET | Freq: Two times a day (BID) | ORAL | 9 refills | Status: DC
  Filled 2022-02-01: qty 60, 30d supply, fill #0
  Filled 2022-03-03: qty 60, 30d supply, fill #1

## 2022-02-07 ENCOUNTER — Encounter (HOSPITAL_COMMUNITY): Payer: Self-pay | Admitting: Gastroenterology

## 2022-02-07 ENCOUNTER — Other Ambulatory Visit (HOSPITAL_COMMUNITY): Payer: Self-pay

## 2022-02-08 ENCOUNTER — Other Ambulatory Visit (HOSPITAL_COMMUNITY): Payer: Self-pay

## 2022-02-11 ENCOUNTER — Other Ambulatory Visit (HOSPITAL_COMMUNITY): Payer: Self-pay

## 2022-02-13 ENCOUNTER — Other Ambulatory Visit (HOSPITAL_COMMUNITY): Payer: Self-pay

## 2022-02-14 ENCOUNTER — Ambulatory Visit (HOSPITAL_BASED_OUTPATIENT_CLINIC_OR_DEPARTMENT_OTHER): Payer: Medicaid Other | Admitting: Certified Registered"

## 2022-02-14 ENCOUNTER — Telehealth: Payer: Self-pay

## 2022-02-14 ENCOUNTER — Encounter (HOSPITAL_COMMUNITY)
Admission: RE | Disposition: A | Discharge status: Home / Self Care | Payer: Self-pay | Source: Ambulatory Visit | Attending: Gastroenterology

## 2022-02-14 ENCOUNTER — Other Ambulatory Visit: Payer: Self-pay

## 2022-02-14 ENCOUNTER — Ambulatory Visit (HOSPITAL_COMMUNITY)
Admission: RE | Admit: 2022-02-14 | Discharge: 2022-02-14 | Disposition: A | Discharge status: Home / Self Care | Payer: Medicaid Other | Source: Ambulatory Visit | Attending: Gastroenterology | Admitting: Gastroenterology

## 2022-02-14 ENCOUNTER — Ambulatory Visit (HOSPITAL_COMMUNITY): Payer: Medicaid Other | Admitting: Certified Registered"

## 2022-02-14 DIAGNOSIS — K602 Anal fissure, unspecified: Secondary | ICD-10-CM | POA: Diagnosis not present

## 2022-02-14 DIAGNOSIS — F32A Depression, unspecified: Secondary | ICD-10-CM | POA: Diagnosis not present

## 2022-02-14 DIAGNOSIS — K449 Diaphragmatic hernia without obstruction or gangrene: Secondary | ICD-10-CM

## 2022-02-14 DIAGNOSIS — F419 Anxiety disorder, unspecified: Secondary | ICD-10-CM | POA: Diagnosis not present

## 2022-02-14 DIAGNOSIS — I739 Peripheral vascular disease, unspecified: Secondary | ICD-10-CM | POA: Diagnosis not present

## 2022-02-14 DIAGNOSIS — D122 Benign neoplasm of ascending colon: Secondary | ICD-10-CM | POA: Diagnosis not present

## 2022-02-14 DIAGNOSIS — K5730 Diverticulosis of large intestine without perforation or abscess without bleeding: Secondary | ICD-10-CM | POA: Insufficient documentation

## 2022-02-14 DIAGNOSIS — K6289 Other specified diseases of anus and rectum: Secondary | ICD-10-CM | POA: Insufficient documentation

## 2022-02-14 DIAGNOSIS — M797 Fibromyalgia: Secondary | ICD-10-CM | POA: Insufficient documentation

## 2022-02-14 DIAGNOSIS — I10 Essential (primary) hypertension: Secondary | ICD-10-CM | POA: Insufficient documentation

## 2022-02-14 DIAGNOSIS — D120 Benign neoplasm of cecum: Secondary | ICD-10-CM | POA: Insufficient documentation

## 2022-02-14 DIAGNOSIS — Z1211 Encounter for screening for malignant neoplasm of colon: Secondary | ICD-10-CM

## 2022-02-14 DIAGNOSIS — K219 Gastro-esophageal reflux disease without esophagitis: Secondary | ICD-10-CM | POA: Diagnosis not present

## 2022-02-14 DIAGNOSIS — Z79899 Other long term (current) drug therapy: Secondary | ICD-10-CM | POA: Insufficient documentation

## 2022-02-14 DIAGNOSIS — K3189 Other diseases of stomach and duodenum: Secondary | ICD-10-CM | POA: Insufficient documentation

## 2022-02-14 DIAGNOSIS — Z9049 Acquired absence of other specified parts of digestive tract: Secondary | ICD-10-CM | POA: Diagnosis not present

## 2022-02-14 DIAGNOSIS — G4730 Sleep apnea, unspecified: Secondary | ICD-10-CM | POA: Diagnosis not present

## 2022-02-14 DIAGNOSIS — J449 Chronic obstructive pulmonary disease, unspecified: Secondary | ICD-10-CM | POA: Insufficient documentation

## 2022-02-14 DIAGNOSIS — K635 Polyp of colon: Secondary | ICD-10-CM | POA: Diagnosis not present

## 2022-02-14 DIAGNOSIS — K529 Noninfective gastroenteritis and colitis, unspecified: Secondary | ICD-10-CM

## 2022-02-14 DIAGNOSIS — I890 Lymphedema, not elsewhere classified: Secondary | ICD-10-CM

## 2022-02-14 DIAGNOSIS — R1013 Epigastric pain: Secondary | ICD-10-CM | POA: Diagnosis not present

## 2022-02-14 DIAGNOSIS — Z7901 Long term (current) use of anticoagulants: Secondary | ICD-10-CM

## 2022-02-14 DIAGNOSIS — D126 Benign neoplasm of colon, unspecified: Secondary | ICD-10-CM

## 2022-02-14 DIAGNOSIS — D125 Benign neoplasm of sigmoid colon: Secondary | ICD-10-CM | POA: Insufficient documentation

## 2022-02-14 DIAGNOSIS — K648 Other hemorrhoids: Secondary | ICD-10-CM | POA: Insufficient documentation

## 2022-02-14 DIAGNOSIS — K625 Hemorrhage of anus and rectum: Secondary | ICD-10-CM | POA: Diagnosis not present

## 2022-02-14 HISTORY — PX: BIOPSY: SHX5522

## 2022-02-14 HISTORY — PX: POLYPECTOMY: SHX5525

## 2022-02-14 HISTORY — PX: ESOPHAGOGASTRODUODENOSCOPY (EGD) WITH PROPOFOL: SHX5813

## 2022-02-14 HISTORY — PX: COLONOSCOPY WITH PROPOFOL: SHX5780

## 2022-02-14 SURGERY — COLONOSCOPY WITH PROPOFOL
Anesthesia: Monitor Anesthesia Care

## 2022-02-14 MED ORDER — PROPOFOL 500 MG/50ML IV EMUL
INTRAVENOUS | Status: DC | PRN
  Administered 2022-02-14: 125 ug/kg/min via INTRAVENOUS

## 2022-02-14 MED ORDER — OMEPRAZOLE 20 MG PO CPDR
20.0000 mg | DELAYED_RELEASE_CAPSULE | Freq: Two times a day (BID) | ORAL | 2 refills | Status: DC
  Filled 2022-02-14: qty 60, 30d supply, fill #0

## 2022-02-14 MED ORDER — SODIUM CHLORIDE 0.9 % IV SOLN: INTRAVENOUS | Status: DC

## 2022-02-14 MED ORDER — PROPOFOL 1000 MG/100ML IV EMUL
INTRAVENOUS | Status: AC
  Filled 2022-02-14: qty 100

## 2022-02-14 MED ORDER — PROPOFOL 10 MG/ML IV BOLUS
INTRAVENOUS | Status: DC | PRN
  Administered 2022-02-14 (×3): 20 mg via INTRAVENOUS

## 2022-02-14 MED ORDER — DILTIAZEM GEL 2 %: CUTANEOUS | 0 refills | Status: DC

## 2022-02-14 MED ORDER — LIDOCAINE 2% (20 MG/ML) 5 ML SYRINGE
INTRAMUSCULAR | Status: DC | PRN
  Administered 2022-02-14: 100 mg via INTRAVENOUS

## 2022-02-14 MED ORDER — LACTATED RINGERS IV SOLN: INTRAVENOUS | Status: DC

## 2022-02-14 SURGICAL SUPPLY — 25 items

## 2022-02-14 NOTE — H&P (Signed)
[facility] History and Physical   Primary Care Physician:  Levonne Lapping, NP   Reason for Procedure:   Epigastric pain / GERD, chronic diarrhea - first colonoscopy for screening as well  Plan:    EGD and colonoscopy      HPI: Theresa Pearson is a 56 y.o. female  with multiple medical problems as outlined. here for EGD and colonoscopy to evaluate symptoms of ongoing epigastric pain, history of GERD on omeprazole PRN, chronic diarrhea. She has a history of cholecystectomy. CT scan in January without clear cause for symptoms. No prior colonoscopy exams in the past. Her case is done at the hospital for anesthesia support due to baseline oxygen use, BMI < 50. She is on Eliquis for history of a clot, that has been held for 2 days. She feels well today without complaints. Otherwise feels well without any cardiopulmonary symptoms.   I have discussed risks / benefits of anesthesia and endoscopic procedures with Milinda Cave and they wish to proceed with the exams as outlined today.    Past Medical History:  Diagnosis Date   Allergy    seasonal   Anemia    Ankylosing spondylitis (HCC)    Anxiety    Arthritis    Asthma    Blood transfusion without reported diagnosis    Cataract    LEFT EYE   Chronic headaches    Clotting disorder (HCC)    COPD (chronic obstructive pulmonary disease) (HCC)    Depression    Diverticulosis    Fibromyalgia    Gallstones    GERD (gastroesophageal reflux disease)    Heart murmur    Hyperlipidemia    Hypertension    Oxygen deficiency    oxygen at 2L,JAN 2021   Pneumonia due to COVID-19 virus 07/2020   Right leg DVT (HCC)    Tachycardia     Past Surgical History:  Procedure Laterality Date   ABDOMINAL HYSTERECTOMY  2008   for  fibroids    CESAREAN SECTION     CHOLECYSTECTOMY     THROMBECTOMY W/ EMBOLECTOMY Right 08/23/2020   Procedure: Right Upper Extremity Thrombectomy;  Surgeon: Cephus Shelling, MD;  Location: [facility] OR;  Service:  Vascular;  Laterality: Right;    Prior to Admission medications   Medication Sig Start Date End Date Taking? Authorizing Provider  allopurinol (ZYLOPRIM) 100 MG tablet Take 1 tablet by mouth daily. 10/31/21  Yes   apixaban (ELIQUIS) 5 MG TABS tablet Take 1 tablet (5 mg total) by mouth 2 (two) times daily. 02/01/22  Yes Maeola Harman, MD  atorvastatin (LIPITOR) 10 MG tablet 1 tablet orally every evening 10/04/21  Yes   budesonide-formoterol (SYMBICORT) 160-4.5 MCG/ACT inhaler Inhale 2 puffs into the lungs in the morning and at bedtime. 09/05/21  Yes Coralyn Helling, MD  busPIRone (BUSPAR) 5 MG tablet Take 1 tablet (5 mg total) by mouth 2 (two) times daily. 11/02/21  Yes   chlorthalidone (HYGROTON) 50 MG tablet Take 1 tablet (50 mg total) by mouth in the morning with food 06/21/21  Yes   cholecalciferol (VITAMIN D3) 25 MCG (1000 UNIT) tablet Take 1,000 Units by mouth daily.   Yes [provider]  diclofenac sodium (VOLTAREN) 1 % GEL Apply 2 g topically 4 (four) times daily. Patient taking differently: Apply 2 g topically 4 (four) times daily as needed (pain). 04/21/17  Yes Draper, Marcial Pacas R, DO  DULoxetine (CYMBALTA) 60 MG capsule Take 1 capsule by mouth daily. 11/05/21  Yes  ferrous sulfate 325 (65 FE) MG tablet TAKE 1 TABLET (325 MG TOTAL) BY MOUTH DAILY WITH BREAKFAST. Patient taking differently: Take 325 mg by mouth daily with breakfast. 09/05/20 02/11/22 Yes Ladell Pier, MD  gabapentin (NEURONTIN) 300 MG capsule Take 1 capsule (300 mg total) by mouth at bedtime for 3 nights. Then increase to 1 capsule two times daily. Patient taking differently: Take 300 mg by mouth daily as needed (Nerve pain). 08/21/21  Yes Lilia Argue R, DO  lisinopril (ZESTRIL) 40 MG tablet Take 1 tablet by mouth daily 10/31/21  Yes Dianna Rossetti, NP  metoprolol tartrate (LOPRESSOR) 50 MG tablet Take 1 tablet by mouth twice daily with food 10/31/21  Yes   montelukast (SINGULAIR) 10 MG tablet Take 1 tablet  (10 mg total) by mouth at bedtime. 08/28/21  Yes Chesley Mires, MD  Multiple Vitamins-Minerals (MULTIVITAMIN PO) Take 1 tablet by mouth daily.   Yes [provider]  PEG-KCl-NaCl-NaSulf-Na Asc-C (PLENVU) 140 g SOLR DISPENSE 1 KIT TAKE AS DIRECTED 01/28/22  Yes Argie Lober, Carlota Raspberry, MD  potassium chloride SA (KLOR-CON M) 20 MEQ tablet Take 1 tablet by mouth twice daily with food 10/31/21  Yes Tann, Landry Dyke, NP  VENTOLIN HFA 108 (90 Base) MCG/ACT inhaler Inhale 2 puffs by mouth every 4-6 hours as needed 10/31/21  Yes   vitamin B-12 (CYANOCOBALAMIN) 1000 MCG tablet Take 1,000 mcg by mouth daily.   Yes [provider]  zinc gluconate 50 MG tablet Take 50 mg by mouth daily.   Yes [provider]  dicyclomine (BENTYL) 10 MG capsule Take 1-2 capsules (10-20 mg total) by mouth every 8 (eight) hours as needed for spasms. 03/12/19   Fulp, Ander Gaster, MD  OVER THE COUNTER MEDICATION daily. SOURSOP LEAVES CUP OF TEA ONCE A DAY    [provider]  potassium chloride SA (KLOR-CON M) 20 MEQ tablet Take 1 tablet by mouth twice daily with food 06/22/21       Current Facility-Administered Medications  Medication Dose Route Frequency Provider Last Rate Last Admin   0.9 %  sodium chloride infusion   Intravenous Continuous Jonathon Tan, Carlota Raspberry, MD       lactated ringers infusion   Intravenous Continuous Emili Mcloughlin, Carlota Raspberry, MD 10 mL/hr at 02/14/22 0802 New Bag at 02/14/22 0802    Allergies as of 12/13/2021 - Review Complete 11/30/2021  Allergen Reaction Noted   Penicillins Shortness Of Breath and Rash 06/27/2013    Family History  Problem Relation Age of Onset   CAD Mother    Diabetes Mellitus II Mother    Stroke Mother    Colon polyps Mother    Crohn's disease Mother    Allergy (severe) Sister    Colon polyps Sister    Diabetes Maternal Grandmother    Hypertension Maternal Grandmother    Heart attack Maternal Grandmother    Prostate cancer Maternal Grandfather    Allergy  (severe) Son    Colon cancer Neg Hx    Esophageal cancer Neg Hx    Rectal cancer Neg Hx    Stomach cancer Neg Hx    Ulcerative colitis Neg Hx     Social History   Socioeconomic History   Marital status: Divorced    Spouse name: Not on file   Number of children: 2   Years of education: Not on file   Highest education level: Not on file  Occupational History   Occupation: truck driver  Tobacco Use   Smoking status: Never  Passive exposure: Past   Smokeless tobacco: Never  Vaping Use   Vaping Use: Never used  Substance and Sexual Activity   Alcohol use: Not Currently    Alcohol/week: 1.0 - 2.0 standard drink of alcohol    Types: 1 - 2 Glasses of wine per week    Comment: occoasional.   Drug use: No   Sexual activity: Yes  Other Topics Concern   Not on file  Social History Narrative   Lives with 2 sons.    Social Determinants of Health   Financial Resource Strain: Not on file  Food Insecurity: Not on file  Transportation Needs: Not on file  Physical Activity: Not on file  Stress: Not on file  Social Connections: Not on file  Intimate Partner Violence: Not on file    Review of Systems: All other review of systems negative except as mentioned in the HPI.  Physical Exam: Vital signs BP (!) 151/82   Pulse 85   Temp 98.1 F (36.7 C) (Oral)   Resp (!) 24   Ht _0  (1.651 m)   Wt 135.2 kg   SpO2 100%   BMI 49.59 kg/m   General:   Alert,  Well-developed, pleasant and cooperative in NAD Lungs:  Clear throughout to auscultation.   Heart:  Regular rate and rhythm Abdomen:  Soft, nontender and nondistended.  Protuberant. Neuro/Psych:  Alert and cooperative. Normal mood and affect. A and O x 3  Jolly Mango, MD [facility]

## 2022-02-14 NOTE — Anesthesia Postprocedure Evaluation (Signed)
Anesthesia Post Note  Patient: Theresa Pearson  Procedure(s) Performed: COLONOSCOPY WITH PROPOFOL ESOPHAGOGASTRODUODENOSCOPY (EGD) WITH PROPOFOL BIOPSY POLYPECTOMY     Patient location during evaluation: Endoscopy Anesthesia Type: MAC Level of consciousness: awake and alert Pain management: pain level controlled Vital Signs Assessment: post-procedure vital signs reviewed and stable Respiratory status: spontaneous breathing, nonlabored ventilation, respiratory function stable and patient connected to nasal cannula oxygen Cardiovascular status: stable and blood pressure returned to baseline Postop Assessment: no apparent nausea or vomiting Anesthetic complications: no   No notable events documented.  Last Vitals:  Vitals:   02/14/22 0935 02/14/22 0940  BP:  (!) 153/85  Pulse: 79 74  Resp: (!) 27 20  Temp:    SpO2: 100% 100%    Last Pain:  Vitals:   02/14/22 0912  TempSrc: Temporal  PainSc: 0-No pain                 Belenda Cruise P Donta Mcinroy

## 2022-02-14 NOTE — Transfer of Care (Signed)
Immediate Anesthesia Transfer of Care Note  Patient: Theresa Pearson  Procedure(s) Performed: COLONOSCOPY WITH PROPOFOL ESOPHAGOGASTRODUODENOSCOPY (EGD) WITH PROPOFOL BIOPSY POLYPECTOMY  Patient Location: PACU and Endoscopy Unit  Anesthesia Type:MAC  Level of Consciousness: awake, alert  and patient cooperative  Airway & Oxygen Therapy: Patient Spontanous Breathing and Patient connected to face mask oxygen  Post-op Assessment: Report given to RN and Post -op Vital signs reviewed and stable  Post vital signs: Reviewed and stable  Last Vitals:  Vitals Value Taken Time  BP    Temp    Pulse 78 02/14/22 0912  Resp 25 02/14/22 0912  SpO2 100 % 02/14/22 0912  Vitals shown include unvalidated device data.  Last Pain:  Vitals:   02/14/22 0736  TempSrc: Oral  PainSc: 0-No pain         Complications: No notable events documented.

## 2022-02-14 NOTE — Telephone Encounter (Signed)
Called and spoke with Angie at [facility]. I gave her a verbal order for Diltiazem/Lidocaine ointment. They will contact pt once RX is ready.   Refill for Omeprazole sent to [place] at [facility].

## 2022-02-14 NOTE — Telephone Encounter (Signed)
-----   Message from Yetta Flock, MD sent at 02/14/2022  9:30 AM EDT ----- Regarding: orders for patient Herbert Seta can you help order some diltiazem / lidocaine ointment for anal fissure for this patient at [place]. Can you also help refill her omeprazole at '20mg'$  BID for her at her regular pharmacy? Thanks!

## 2022-02-14 NOTE — Op Note (Signed)
[facility] Patient Name: Siah Kannan Procedure Date: 02/14/2022 MRN: [id number] Attending MD: Carlota Raspberry. Havery Moros , MD Date of Birth: 1965-11-30 CSN: [id number] Age: 56 Admit Type: Outpatient Procedure:                Upper GI endoscopy Indications:              Epigastric abdominal pain, history of                            gastro-esophageal reflux disease - history of                            cholecystectomy Providers:                Remo Lipps P. Havery Moros, MD, Allayne Gitelman, RN, Frazier Richards, Technician Referring MD:              Medicines:                Monitored Anesthesia Care Complications:            No immediate complications. Estimated blood loss:                            Minimal. Estimated Blood Loss:     Estimated blood loss was minimal. Procedure:                Pre-Anesthesia Assessment:                           - Prior to the procedure, a History and Physical                            was performed, and patient medications and                            allergies were reviewed. The patient's tolerance of                            previous anesthesia was also reviewed. The risks                            and benefits of the procedure and the sedation                            options and risks were discussed with the patient.                            All questions were answered, and informed consent                            was obtained. Prior Anticoagulants: The patient has                            taken Eliquis (apixaban), last dose was  2 days                            prior to procedure. ASA Grade Assessment: III - A                            patient with severe systemic disease. After                            reviewing the risks and benefits, the patient was                            deemed in satisfactory condition to undergo the                            procedure.                           After  obtaining informed consent, the endoscope was                            passed under direct vision. Throughout the                            procedure, the patient's blood pressure, pulse, and                            oxygen saturations were monitored continuously. The                            [id number]-[id number] ([id number]) Olympus endoscope was introduced                            through the mouth, and advanced to the second part                            of duodenum. The upper GI endoscopy was                            accomplished without difficulty. The patient                            tolerated the procedure well. Scope In: Scope Out: Findings:      Esophagogastric landmarks were identified: the Z-line was found at 39       cm, the gastroesophageal junction was found at 39 cm and the upper       extent of the gastric folds was found at 40 cm from the incisors.      A 1 cm hiatal hernia was present.      The exam of the esophagus was otherwise normal.      There was possible extrinsic compression of the gastric body vs. poor       air insufflation in that area. Mild gastric body erythema. The examined       stomach was otherwise normal. Biopsies were taken with a cold forceps  for Helicobacter pylori testing.      A single 8 to 10 mm nodule with central focal ulceration was found in       the duodenal bulb. Biopsies were taken with a cold forceps for histology       - rule out carcinoid.      Lymphangiectasias were present in the second portion of the duodenum.      The exam of the duodenum was otherwise normal. Impression:               - Esophagogastric landmarks identified.                           - 1 cm hiatal hernia.                           - Normal esophagus otherwise                           - Extrinsic compression of gastric body vs. poor                            air sufflation                           - Mild gastric body erythema                           -  Normal stomach otherwise. Biopsied.                           - Nodule found in the duodenal as outlined,                            concerning for possible carcinoid. Biopsied.                           - Duodenal mucosal lymphangiectasias                           - Normal duodenum otherwise Moderate Sedation:      No moderate sedation, case performed with MAC Recommendation:           - Patient has a contact number available for                            emergencies. The signs and symptoms of potential                            delayed complications were discussed with the                            patient. Return to normal activities tomorrow.                            Written discharge instructions were provided to the  patient.                           - Resume previous diet.                           - Continue present medications.                           - Resume Eliquis tomorrow per colonoscopy note                           - Take omeprazole twice daily if have not tried                            that yet                           - Await pathology results. Anticipate EUS will be                            needed to further evaluate duodenal bulb nodule, if                            pathology nondiagnostic Procedure Code(s):        --- Professional ---                           [phone], Esophagogastroduodenoscopy, flexible,                            transoral; with biopsy, single or multiple Diagnosis Code(s):        --- Professional ---                           K44.9, Diaphragmatic hernia without obstruction or                            gangrene                           K31.89, Other diseases of stomach and duodenum                           I89.0, Lymphedema, not elsewhere classified                           R10.13, Epigastric pain                           K21.9, Gastro-esophageal reflux disease without                             esophagitis CPT copyright 2019 American Medical Association. All rights reserved. The codes documented in this report are preliminary and upon coder review may  be revised to meet current compliance requirements. Remo Lipps P. Markees Carns, MD 02/14/2022 9:19:03 AM This report has been signed electronically. Number of  Addenda: 0

## 2022-02-14 NOTE — Anesthesia Procedure Notes (Signed)
Procedure Name: MAC Date/Time: 02/14/2022 8:20 AM  Performed by: Eben Burow, CRNAPre-anesthesia Checklist: Patient identified, Emergency Drugs available, Suction available, Patient being monitored and Timeout performed Oxygen Delivery Method: Simple face mask Placement Confirmation: positive ETCO2

## 2022-02-14 NOTE — Op Note (Signed)
[facility] Patient Name: Theresa Pearson Procedure Date: 02/14/2022 MRN: [id number] Attending MD: Carlota Raspberry. Havery Moros , MD Date of Birth: [birth year] CSN: [id number] Age: 56 Admit Type: Outpatient Procedure:                Colonoscopy Indications:              Chronic diarrhea, Rectal bleeding - first                            colonoscopy, no prior screening Providers:                Carlota Raspberry. Havery Moros, MD, Allayne Gitelman, RN, Frazier Richards, Technician Referring MD:              Medicines:                Monitored Anesthesia Care Complications:            No immediate complications. Estimated blood loss:                            Minimal. Estimated Blood Loss:     Estimated blood loss was minimal. Procedure:                Pre-Anesthesia Assessment:                           - Prior to the procedure, a History and Physical                            was performed, and patient medications and                            allergies were reviewed. The patient's tolerance of                            previous anesthesia was also reviewed. The risks                            and benefits of the procedure and the sedation                            options and risks were discussed with the patient.                            All questions were answered, and informed consent                            was obtained. Prior Anticoagulants: The patient has                            taken Eliquis (apixaban), last dose was 2 days                            prior to  procedure. ASA Grade Assessment: III - A                            patient with severe systemic disease. After                            reviewing the risks and benefits, the patient was                            deemed in satisfactory condition to undergo the                            procedure.                           After obtaining informed consent, the colonoscope                             was passed under direct vision. Throughout the                            procedure, the patient's blood pressure, pulse, and                            oxygen saturations were monitored continuously. The                            [id number]-HQ190L ([id number] Olympus colonoscope was                            introduced through the anus and advanced to the the                            cecum, identified by appendiceal orifice and                            ileocecal valve. The colonoscopy was performed                            without difficulty. The patient tolerated the                            procedure well. The quality of the bowel                            preparation was good. The ileocecal valve,                            appendiceal orifice, and rectum were photographed. Scope In: 8:40:11 AM Scope Out: 9:01:36 AM Scope Withdrawal Time: 0 hours 16 minutes 57 seconds  Total Procedure Duration: 0 hours 21 minutes 25 seconds  Findings:      An anal fissure was found on perianal exam in the posterior midline anal       canal. Small hemorrhoids noted on rectal exam.  Multiple small-mouthed diverticula were found in the transverse colon,       ascending colon and left colon, with some luminal narrowing of the       sigmoid colon and restricted mobility of this area. Time was taken to       slowly navigate this area during cecal intubation.      Two sessile polyps were found in the cecum. The polyps were diminutive       in size. These polyps were removed with a cold snare. Resection and       retrieval were complete.      A diminutive polyp was found in the sigmoid colon. The polyp was       sessile. The polyp was removed with a cold biopsy forceps. Resection and       retrieval were complete.      Anal papilla(e) were hypertrophied.      Internal hemorrhoids were found during retroflexion. The hemorrhoids       were small.      The exam was otherwise without  abnormality.      Biopsies for histology were taken with a cold forceps from the right       colon, left colon and transverse colon for evaluation of microscopic       colitis. Impression:               - Anal fissure and hemorrhoids found on perianal                            exam.                           - Diverticulosis in the transverse colon, in the                            ascending colon and in the left colon.                           - Two diminutive polyps in the cecum, removed with                            a cold snare. Resected and retrieved.                           - One diminutive polyp in the sigmoid colon,                            removed with a cold biopsy forceps. Resected and                            retrieved.                           - Anal papilla(e) were hypertrophied.                           - Internal hemorrhoids.                           - The examination  was otherwise normal.                           - Biopsies were taken with a cold forceps from the                            right colon, left colon and transverse colon for                            evaluation of microscopic colitis. Moderate Sedation:      No moderate sedation, case performed with MAC Recommendation:           - Patient has a contact number available for                            emergencies. The signs and symptoms of potential                            delayed complications were discussed with the                            patient. Return to normal activities tomorrow.                            Written discharge instructions were provided to the                            patient.                           - Resume previous diet.                           - Continue present medications.                           - Resume Eliquis tomorrow                           - Await pathology results.                           - Recommend topical diltiazem / lidocaine ointment                             for treatment of anal fissure. To be picked up at                            [facility], our office will coordinate                           - Immodium as needed for diarrhea, consider adding                            colestipol if biopsies of the colon are negative  given history of cholecystectomy Procedure Code(s):        --- Professional ---                           [phone], Colonoscopy, flexible; with removal of                            tumor(s), polyp(s), or other lesion(s) by snare                            technique                           [id number], 42, Colonoscopy, flexible; with biopsy,                            single or multiple Diagnosis Code(s):        --- Professional ---                           K60.2, Anal fissure, unspecified                           K64.8, Other hemorrhoids                           K63.5, Polyp of colon                           K62.89, Other specified diseases of anus and rectum                           K52.9, Noninfective gastroenteritis and colitis,                            unspecified                           K62.5, Hemorrhage of anus and rectum                           K57.30, Diverticulosis of large intestine without                            perforation or abscess without bleeding CPT copyright 2019 American Medical Association. All rights reserved. The codes documented in this report are preliminary and upon coder review may  be revised to meet current compliance requirements. Remo Lipps P. Tahmid Stonehocker, MD 02/14/2022 9:10:39 AM This report has been signed electronically. Number of Addenda: 0

## 2022-02-14 NOTE — Anesthesia Preprocedure Evaluation (Signed)
Anesthesia Evaluation  Patient identified by MRN, date of birth, ID band Patient awake    Reviewed: Allergy & Precautions, NPO status , Patient's Chart, lab work & pertinent test results  Airway Mallampati: II  TM Distance: >3 FB Neck ROM: Full    Dental no notable dental hx.    Pulmonary asthma , sleep apnea , COPD,  COPD inhaler,    Pulmonary exam normal        Cardiovascular hypertension, Pt. on medications and Pt. on home beta blockers + Peripheral Vascular Disease   Rhythm:Regular Rate:Normal     Neuro/Psych  Headaches, Anxiety Depression    GI/Hepatic Neg liver ROS, GERD  ,  Endo/Other  negative endocrine ROS  Renal/GU negative Renal ROS  negative genitourinary   Musculoskeletal  (+) Arthritis , Fibromyalgia -  Abdominal Normal abdominal exam  (+)   Peds  Hematology  (+) Blood dyscrasia, anemia ,   Anesthesia Other Findings   Reproductive/Obstetrics                             Anesthesia Physical Anesthesia Plan  ASA: 3  Anesthesia Plan: MAC   Post-op Pain Management:    Induction: Intravenous  PONV Risk Score and Plan: 2 and Propofol infusion and Treatment may vary due to age or medical condition  Airway Management Planned: Simple Face Mask, Natural Airway and Nasal Cannula  Additional Equipment: None  Intra-op Plan:   Post-operative Plan:   Informed Consent: I have reviewed the patients History and Physical, chart, labs and discussed the procedure including the risks, benefits and alternatives for the proposed anesthesia with the patient or authorized representative who has indicated his/her understanding and acceptance.     Dental advisory given  Plan Discussed with: CRNA  Anesthesia Plan Comments:         Anesthesia Quick Evaluation

## 2022-02-15 ENCOUNTER — Encounter (HOSPITAL_COMMUNITY): Payer: Self-pay | Admitting: Gastroenterology

## 2022-02-15 ENCOUNTER — Other Ambulatory Visit: Payer: Self-pay

## 2022-02-15 LAB — SURGICAL PATHOLOGY

## 2022-02-18 ENCOUNTER — Other Ambulatory Visit: Payer: Self-pay

## 2022-02-18 MED ORDER — COLESTIPOL HCL 1 G PO TABS: 1.0000 g | ORAL_TABLET | Freq: Two times a day (BID) | ORAL | 2 refills | Status: DC

## 2022-02-18 MED ORDER — OMEPRAZOLE 20 MG PO CPDR: 20.0000 mg | DELAYED_RELEASE_CAPSULE | Freq: Two times a day (BID) | ORAL | 2 refills | Status: DC

## 2022-02-19 ENCOUNTER — Other Ambulatory Visit: Payer: Self-pay

## 2022-02-19 ENCOUNTER — Telehealth: Payer: Self-pay

## 2022-02-19 DIAGNOSIS — K319 Disease of stomach and duodenum, unspecified: Secondary | ICD-10-CM

## 2022-02-19 DIAGNOSIS — Z7901 Long term (current) use of anticoagulants: Secondary | ICD-10-CM

## 2022-02-19 DIAGNOSIS — R1013 Epigastric pain: Secondary | ICD-10-CM

## 2022-02-19 NOTE — Telephone Encounter (Signed)
-----   Message from Irving Copas., MD sent at 02/18/2022 10:20 PM EDT ----- Regarding: RE: another possible EUS SA, No worries. Just send a reply back to [place] and I once you have spoken with her and if she agrees to the plan of action.  Then we can work on trying to get her set up in the next month or 2 pending increased availability as I change my schedule around. Thanks. GM ----- Message ----- From: Yetta Flock, MD Sent: 02/18/2022   9:40 PM EDT To: Irving Copas., MD Subject: RE: another possible EUS                       Thanks Gabe. Happy to get a repeat CT and chromogranin A level and will order that.  Given the superficial biopsies are negative, was thinking EUS could at least clarify what this is first and then discuss treatment options from there. Okay if I have Robb Sibal get her on your schedule while we await CT? I can call her when I get back to town to discuss EUS, unless you want to see her first. 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 I saw in January she had a normal CT. If she still having significant discomfort may be reasonable to consider updated CT abdomen imaging with contrast if she can tolerate. Obtain a chromogranin A (if she is on PPI will just accept that it could be elevated from PPI) Looks slightly larger than 10 mm and if this truly was a duodenal carcinoid those lesions that are between 10 and 20 mm are not clearly defined that endoscopic approach will offer them the biggest benefit but certainly can be considered. EGD/EUS is reasonable if the patient wanted. The question is going to be when I can get it in. Let me know when you return what the patient and you discuss and agree on and then we can go from there. GM ----- Message  ----- From: Yetta Flock, MD Sent: 02/15/2022   5:57 PM EDT To: Irving Copas., MD Subject: another possible EUS                           Sloan Leiter,  Nonurgent.  Can you take a look at this EGD when you get a chance, ulcerated duodenal nodule, initial concern for me was a carcinoid but biopsies negative.  This may be subepithelial.  What you think about EUS for this?  Thanks  Richardson Landry

## 2022-02-20 ENCOUNTER — Telehealth: Payer: Self-pay

## 2022-02-20 NOTE — Telephone Encounter (Signed)
CT abdomen and pelvis scheduled for Thursday, 02/28/22 at 4 pm.

## 2022-02-25 ENCOUNTER — Telehealth: Payer: Self-pay

## 2022-02-25 ENCOUNTER — Other Ambulatory Visit: Payer: Medicaid Other

## 2022-02-25 DIAGNOSIS — Z7901 Long term (current) use of anticoagulants: Secondary | ICD-10-CM

## 2022-02-25 DIAGNOSIS — K319 Disease of stomach and duodenum, unspecified: Secondary | ICD-10-CM

## 2022-02-25 DIAGNOSIS — R1013 Epigastric pain: Secondary | ICD-10-CM

## 2022-02-25 NOTE — Telephone Encounter (Signed)
-----   Message from Yetta Flock, MD sent at 02/25/2022  4:35 PM EDT ----- Regarding: RE: another possible EUS Thanks everyone.  I called the patient and explained what EUS is with her and she is wanting to proceed with this at some point when you have a free opening in your schedule.  Otherwise awaiting the CT scan and labs.  Thank you  Richardson Landry ----- Message ----- From: Irving Copas., MD Sent: 02/18/2022  10:21 PM EDT To: Timothy Lasso, RN; Yetta Flock, MD Subject: RE: another possible EUS                       SA, No worries. Just send a reply back to [place] and I once you have spoken with her and if she agrees to the plan of action.  Then we can work on trying to get her set up in the next month or 2 pending increased availability as I change my schedule around. Thanks. GM ----- Message ----- From: Yetta Flock, MD Sent: 02/18/2022   9:40 PM EDT To: Irving Copas., MD Subject: RE: another possible EUS                       Thanks Gabe. Happy to get a repeat CT and chromogranin A level and will order that.  Given the superficial biopsies are negative, was thinking EUS could at least clarify what this is first and then discuss treatment options from there. Okay if I have Modupe Shampine get her on your schedule while we await CT? I can call her when I get back to town to discuss EUS, unless you want to see her first. 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 I saw in January she had a normal CT. If she still having significant discomfort may be reasonable to consider updated CT abdomen imaging with contrast if she can tolerate. Obtain a chromogranin A (if she is on PPI will just accept that it could be elevated from PPI) Looks slightly larger than 10  mm and if this truly was a duodenal carcinoid those lesions that are between 10 and 20 mm are not clearly defined that endoscopic approach will offer them the biggest benefit but certainly can be considered. EGD/EUS is reasonable if the patient wanted. The question is going to be when I can get it in. Let me know when you return what the patient and you discuss and agree on and then we can go from there. GM ----- Message ----- From: Yetta Flock, MD Sent: 02/15/2022   5:57 PM EDT To: Irving Copas., MD Subject: another possible EUS                           Sloan Leiter,  Nonurgent.  Can you take a look at this EGD when you get a chance, ulcerated duodenal nodule, initial concern for me was a carcinoid but biopsies negative.  This may be subepithelial.  What you think about EUS for this?  Thanks  Richardson Landry

## 2022-02-26 ENCOUNTER — Other Ambulatory Visit: Payer: Self-pay

## 2022-02-26 DIAGNOSIS — K319 Disease of stomach and duodenum, unspecified: Secondary | ICD-10-CM

## 2022-02-26 LAB — CHROMOGRANIN A: Chromogranin A (ng/mL): 417.6 ng/mL — ABNORMAL HIGH (ref 0.0–101.8)

## 2022-02-28 ENCOUNTER — Ambulatory Visit (HOSPITAL_COMMUNITY)
Admission: RE | Admit: 2022-02-28 | Discharge: 2022-02-28 | Disposition: A | Discharge status: Home / Self Care | Payer: Medicaid Other | Source: Ambulatory Visit | Attending: Gastroenterology | Admitting: Gastroenterology

## 2022-02-28 DIAGNOSIS — R1013 Epigastric pain: Secondary | ICD-10-CM | POA: Insufficient documentation

## 2022-02-28 DIAGNOSIS — K319 Disease of stomach and duodenum, unspecified: Secondary | ICD-10-CM | POA: Diagnosis present

## 2022-02-28 DIAGNOSIS — Z7901 Long term (current) use of anticoagulants: Secondary | ICD-10-CM | POA: Diagnosis present

## 2022-02-28 MED ORDER — IOHEXOL 300 MG/ML  SOLN
100.0000 mL | Freq: Once | INTRAMUSCULAR | Status: AC | PRN
Start: 2022-02-28 — End: 2022-02-28
  Administered 2022-02-28: 100 mL via INTRAVENOUS

## 2022-02-28 MED ORDER — SODIUM CHLORIDE (PF) 0.9 % IJ SOLN
INTRAMUSCULAR | Status: AC
  Filled 2022-02-28: qty 50

## 2022-03-01 ENCOUNTER — Other Ambulatory Visit (HOSPITAL_COMMUNITY): Payer: Self-pay

## 2022-03-03 ENCOUNTER — Other Ambulatory Visit: Payer: Self-pay | Admitting: Gastroenterology

## 2022-03-04 ENCOUNTER — Other Ambulatory Visit (HOSPITAL_COMMUNITY): Payer: Self-pay

## 2022-03-04 MED ORDER — DILTIAZEM GEL 2 %: CUTANEOUS | 0 refills | Status: AC

## 2022-03-05 ENCOUNTER — Other Ambulatory Visit (HOSPITAL_COMMUNITY): Payer: Self-pay

## 2022-03-06 ENCOUNTER — Other Ambulatory Visit (HOSPITAL_COMMUNITY): Payer: Self-pay

## 2022-03-08 ENCOUNTER — Other Ambulatory Visit (HOSPITAL_COMMUNITY): Payer: Self-pay

## 2022-03-08 ENCOUNTER — Other Ambulatory Visit: Payer: Self-pay

## 2022-03-08 MED ORDER — METOPROLOL TARTRATE 50 MG PO TABS
50.0000 mg | ORAL_TABLET | Freq: Two times a day (BID) | ORAL | 0 refills | Status: AC
Start: 2022-03-08 — End: ?
  Filled 2022-03-08: qty 180, 90d supply, fill #0

## 2022-03-08 MED ORDER — CHLORTHALIDONE 50 MG PO TABS
50.0000 mg | ORAL_TABLET | ORAL | 1 refills | Status: AC
  Filled 2022-03-08 – 2022-08-15 (×3): qty 90, 90d supply, fill #0

## 2022-03-09 ENCOUNTER — Other Ambulatory Visit (HOSPITAL_COMMUNITY): Payer: Self-pay

## 2022-03-11 ENCOUNTER — Encounter: Payer: Self-pay | Admitting: Obstetrics and Gynecology

## 2022-03-11 ENCOUNTER — Other Ambulatory Visit: Payer: Self-pay | Admitting: Obstetrics and Gynecology

## 2022-03-11 ENCOUNTER — Other Ambulatory Visit: Payer: Self-pay

## 2022-03-11 DIAGNOSIS — N83209 Unspecified ovarian cyst, unspecified side: Secondary | ICD-10-CM

## 2022-03-21 ENCOUNTER — Other Ambulatory Visit: Payer: Self-pay

## 2022-04-12 ENCOUNTER — Encounter (HOSPITAL_COMMUNITY): Payer: Self-pay | Admitting: Gastroenterology

## 2022-04-15 NOTE — Progress Notes (Signed)
Attempted to obtain medical history via telephone, unable to reach at this time. HIPAA compliant voicemail message left requesting return call to pre surgical testing department. 

## 2022-04-16 ENCOUNTER — Other Ambulatory Visit: Payer: Self-pay | Admitting: Obstetrics & Gynecology

## 2022-04-17 ENCOUNTER — Encounter (HOSPITAL_COMMUNITY): Payer: Self-pay | Admitting: Gastroenterology

## 2022-04-22 ENCOUNTER — Other Ambulatory Visit: Payer: Self-pay

## 2022-04-22 ENCOUNTER — Ambulatory Visit (HOSPITAL_BASED_OUTPATIENT_CLINIC_OR_DEPARTMENT_OTHER): Payer: Medicaid Other | Admitting: Certified Registered"

## 2022-04-22 ENCOUNTER — Encounter (HOSPITAL_COMMUNITY)
Admission: RE | Disposition: A | Discharge status: Home / Self Care | Payer: Self-pay | Source: Ambulatory Visit | Attending: Gastroenterology

## 2022-04-22 ENCOUNTER — Ambulatory Visit (HOSPITAL_COMMUNITY)
Admission: RE | Admit: 2022-04-22 | Discharge: 2022-04-22 | Disposition: A | Discharge status: Home / Self Care | Payer: Medicaid Other | Source: Ambulatory Visit | Attending: Gastroenterology | Admitting: Gastroenterology

## 2022-04-22 ENCOUNTER — Ambulatory Visit (HOSPITAL_COMMUNITY): Payer: Medicaid Other | Admitting: Certified Registered"

## 2022-04-22 ENCOUNTER — Encounter (HOSPITAL_COMMUNITY): Payer: Self-pay | Admitting: Gastroenterology

## 2022-04-22 DIAGNOSIS — I10 Essential (primary) hypertension: Secondary | ICD-10-CM

## 2022-04-22 DIAGNOSIS — Z6841 Body Mass Index (BMI) 40.0 and over, adult: Secondary | ICD-10-CM | POA: Insufficient documentation

## 2022-04-22 DIAGNOSIS — G709 Myoneural disorder, unspecified: Secondary | ICD-10-CM | POA: Diagnosis not present

## 2022-04-22 DIAGNOSIS — K319 Disease of stomach and duodenum, unspecified: Secondary | ICD-10-CM

## 2022-04-22 DIAGNOSIS — Z9981 Dependence on supplemental oxygen: Secondary | ICD-10-CM | POA: Diagnosis not present

## 2022-04-22 DIAGNOSIS — K2289 Other specified disease of esophagus: Secondary | ICD-10-CM | POA: Diagnosis not present

## 2022-04-22 DIAGNOSIS — K317 Polyp of stomach and duodenum: Secondary | ICD-10-CM

## 2022-04-22 DIAGNOSIS — Z86718 Personal history of other venous thrombosis and embolism: Secondary | ICD-10-CM | POA: Diagnosis not present

## 2022-04-22 DIAGNOSIS — J4489 Other specified chronic obstructive pulmonary disease: Secondary | ICD-10-CM | POA: Diagnosis not present

## 2022-04-22 DIAGNOSIS — G4730 Sleep apnea, unspecified: Secondary | ICD-10-CM | POA: Insufficient documentation

## 2022-04-22 DIAGNOSIS — Z7722 Contact with and (suspected) exposure to environmental tobacco smoke (acute) (chronic): Secondary | ICD-10-CM

## 2022-04-22 DIAGNOSIS — I899 Noninfective disorder of lymphatic vessels and lymph nodes, unspecified: Secondary | ICD-10-CM

## 2022-04-22 DIAGNOSIS — K3189 Other diseases of stomach and duodenum: Secondary | ICD-10-CM | POA: Diagnosis not present

## 2022-04-22 DIAGNOSIS — D3A010 Benign carcinoid tumor of the duodenum: Secondary | ICD-10-CM | POA: Insufficient documentation

## 2022-04-22 DIAGNOSIS — M797 Fibromyalgia: Secondary | ICD-10-CM | POA: Diagnosis not present

## 2022-04-22 DIAGNOSIS — M1990 Unspecified osteoarthritis, unspecified site: Secondary | ICD-10-CM | POA: Diagnosis not present

## 2022-04-22 HISTORY — PX: HEMOSTASIS CLIP PLACEMENT: SHX6857

## 2022-04-22 HISTORY — PX: ESOPHAGOGASTRODUODENOSCOPY (EGD) WITH PROPOFOL: SHX5813

## 2022-04-22 HISTORY — PX: SUBMUCOSAL LIFTING INJECTION: SHX6855

## 2022-04-22 HISTORY — PX: ENDOSCOPIC MUCOSAL RESECTION: SHX6839

## 2022-04-22 HISTORY — PX: EUS: SHX5427

## 2022-04-22 SURGERY — UPPER ENDOSCOPIC ULTRASOUND (EUS) RADIAL
Anesthesia: Monitor Anesthesia Care

## 2022-04-22 MED ORDER — SUCRALFATE 1 G PO TABS: 1.0000 g | ORAL_TABLET | Freq: Two times a day (BID) | ORAL | 0 refills | Status: AC

## 2022-04-22 MED ORDER — ONDANSETRON HCL 4 MG/2ML IJ SOLN
INTRAMUSCULAR | Status: DC | PRN
  Administered 2022-04-22: 4 mg via INTRAVENOUS

## 2022-04-22 MED ORDER — ALBUTEROL SULFATE HFA 108 (90 BASE) MCG/ACT IN AERS
INHALATION_SPRAY | RESPIRATORY_TRACT | Status: DC | PRN
  Administered 2022-04-22: 2 via RESPIRATORY_TRACT

## 2022-04-22 MED ORDER — SUCCINYLCHOLINE CHLORIDE 200 MG/10ML IV SOSY
PREFILLED_SYRINGE | INTRAVENOUS | Status: DC | PRN
  Administered 2022-04-22: 100 mg via INTRAVENOUS

## 2022-04-22 MED ORDER — LACTATED RINGERS IV SOLN: INTRAVENOUS | Status: DC

## 2022-04-22 MED ORDER — APIXABAN 5 MG PO TABS: 5.0000 mg | ORAL_TABLET | Freq: Two times a day (BID) | ORAL | 9 refills | Status: AC

## 2022-04-22 MED ORDER — LIDOCAINE 2% (20 MG/ML) 5 ML SYRINGE
INTRAMUSCULAR | Status: DC | PRN
  Administered 2022-04-22: 40 mg via INTRAVENOUS

## 2022-04-22 MED ORDER — PROPOFOL 500 MG/50ML IV EMUL
INTRAVENOUS | Status: DC | PRN
  Administered 2022-04-22: 150 ug/kg/min via INTRAVENOUS

## 2022-04-22 MED ORDER — SODIUM CHLORIDE 0.9 % IV SOLN: INTRAVENOUS | Status: DC

## 2022-04-22 MED ORDER — PROPOFOL 10 MG/ML IV BOLUS
INTRAVENOUS | Status: DC | PRN
  Administered 2022-04-22: 50 mg via INTRAVENOUS
  Administered 2022-04-22 (×3): 20 mg via INTRAVENOUS
  Administered 2022-04-22: 100 mg via INTRAVENOUS

## 2022-04-22 MED ORDER — PHENYLEPHRINE HCL (PRESSORS) 10 MG/ML IV SOLN
INTRAVENOUS | Status: DC | PRN
  Administered 2022-04-22: 80 ug via INTRAVENOUS

## 2022-04-22 MED ORDER — OMEPRAZOLE 40 MG PO CPDR: 40.0000 mg | DELAYED_RELEASE_CAPSULE | Freq: Two times a day (BID) | ORAL | 6 refills | Status: AC

## 2022-04-22 SURGICAL SUPPLY — 15 items

## 2022-04-22 NOTE — Anesthesia Postprocedure Evaluation (Signed)
Anesthesia Post Note  Patient: Theresa Pearson  Procedure(s) Performed: UPPER ENDOSCOPIC ULTRASOUND (EUS) RADIAL ESOPHAGOGASTRODUODENOSCOPY (EGD) WITH PROPOFOL ENDOSCOPIC MUCOSAL RESECTION SUBMUCOSAL LIFTING INJECTION POLYPECTOMY HEMOSTASIS CLIP PLACEMENT     Patient location during evaluation: Endoscopy Anesthesia Type: MAC Level of consciousness: awake and patient cooperative Pain management: pain level controlled Vital Signs Assessment: post-procedure vital signs reviewed and stable Respiratory status: spontaneous breathing, nonlabored ventilation and respiratory function stable Cardiovascular status: stable and blood pressure returned to baseline Postop Assessment: no apparent nausea or vomiting Anesthetic complications: no   No notable events documented.  Last Vitals:  Vitals:   04/22/22 1020 04/22/22 1030  BP: (!) 125/56 111/81  Pulse: 84 73  Resp: 19 20  Temp:    SpO2: 97% 100%    Last Pain:  Vitals:   04/22/22 1030  TempSrc:   PainSc: 0-No pain                 Kevonta Phariss

## 2022-04-22 NOTE — Op Note (Addendum)
[facility] Patient Name: Theresa Pearson Procedure Date: 04/22/2022 MRN: [id number] Attending MD: Justice Britain , MD Date of Birth: Nov 22, 1965 CSN: [id number] Age: 56 Admit Type: Outpatient Procedure:                Upper EUS Indications:              Duodenal deformity on endoscopy/Subepithelial tumor                            vs. extrinsic compression, Neuroendocrine Tumor Providers:                Justice Britain, MD, Dulcy Fanny, Cletis Athens, Technician Referring MD:             Carlota Raspberry. Havery Moros, MD Medicines:                Monitored Anesthesia Care Complications:            No immediate complications. Estimated Blood Loss:     Estimated blood loss was minimal. Procedure:                Pre-Anesthesia Assessment:                           - Prior to the procedure, a History and Physical                            was performed, and patient medications and                            allergies were reviewed. The patient's tolerance of                            previous anesthesia was also reviewed. The risks                            and benefits of the procedure and the sedation                            options and risks were discussed with the patient.                            All questions were answered, and informed consent                            was obtained. Prior Anticoagulants: The patient has                            taken Eliquis (apixaban), last dose was 3 days                            prior to procedure. ASA Grade Assessment: III - A  patient with severe systemic disease. After                            reviewing the risks and benefits, the patient was                            deemed in satisfactory condition to undergo the                            procedure.                           After obtaining informed consent, the endoscope was                             passed under direct vision. Throughout the                            procedure, the patient's blood pressure, pulse, and                            oxygen saturations were monitored continuously. The                            [id number]-[id number] ([id number]) Olympus therapeutic endoscope                            was introduced through the mouth, and advanced to                            the second part of duodenum. The GF-[id number]-AL5                            ([id number]) Olympus radial ultrasound scope was                            introduced through the mouth, and advanced to the                            duodenum for ultrasound examination from the                            stomach and duodenum. The upper EUS was                            accomplished without difficulty. The patient                            tolerated the procedure. The GIF-[id number] ([id number])                            Olympus endoscope was introduced through the mouth,                            and  advanced to the second part of duodenum. Scope In: Scope Out: Findings:      ENDOSCOPIC FINDING: :      No gross lesions were noted in the entire esophagus.      The Z-line was irregular and was found 40 cm from the incisors.      Striped mildly erythematous mucosa without bleeding was found in the       gastric antrum - biopsies were not performed since they were recently       done and negative for HP. No other gross lesions in the visualized       stomach      A single approximately 14 mm submucosal nodule with central ulceration       was found in the duodenal bulb. Even though we did not have pathology       that was positive from the previous endoscopy, this has a more typical       appearance concerning for neuroendocrine tumor. As such, after the EUS       was completed, preparations were made for attempt at mucosal resection.       NBI imaging and white light endoscopy was done to demarcate the borders       of the  lesion. Everlift was injected to raise the lesion. Band ligator       resection was initially attempted however the lesion was too large to       fit within the banding. As the lesion was only in layer to and did not       involve the submucosa, I felt that typical EMR may be pursued and I then       proceeded with a snare mucosal resection was performed. Although I felt       I had received the majority of the lesion in the snare, I did notice       after the initial cut that there was still a portion present so I did a       piecemeal resection. Retrieval was felt to be complete. To prevent       bleeding after mucosal resection, four hemostatic clips were       successfully placed (MR conditional). There was no bleeding during, or       at the end, of the procedure.      ENDOSONOGRAPHIC FINDING: :      An oval intramural (subepithelial) lesion was found in the duodenal       bulb. The lesion was hypoechoic. Endosonographically, the lesion       appeared to originate from within the deep mucosa (Layer 2). The lesion       measured 14 mm (in maximum thickness). The lesion also measured 7 mm in       diameter. The outer margins were well defined.      No malignant-appearing lymph nodes were visualized in the celiac region       (level 20), perigastric region and porta hepatis region.      Endosonographic imaging in the visualized portion of the liver showed no       mass.      The celiac was visualized. Impression:               EGD impression:                           - No gross lesions in esophagus.  Z-line irregular,                            40 cm from the incisors.                           - Erythematous mucosa in the antrum -previously                            biopsied and negative for HP so not rebiopsied. No                            other gross lesions noted in the stomach.                           - Submucosal nodule with central ulceration found                             in the duodenum bulb. After EUS was completed,                            complete removal was accomplished with a piecemeal                            mucosal resection technique. Clips (MR conditional)                            were placed.                           EUS impression:                           - An intramural (subepithelial) lesion was found in                            the duodenal bulb. The lesion appeared to originate                            from within the deep mucosa (Layer 2). Tissue has                            not been obtained. However, the clinical appearance                            is suspicious for a neuroendocrine tumor.                           - No malignant-appearing lymph nodes were                            visualized in the celiac region (level 20),                            perigastric region and porta hepatis  region. Moderate Sedation:      Not Applicable - Patient had care per Anesthesia. Recommendation:           - The patient will be observed post-procedure,                            until all discharge criteria are met.                           - Discharge patient to home.                           - Full liquid diet today. If patient tolerates                            things then can go to soft diet tomorrow.                           - Observe patient's clinical course.                           - Await path results.                           - May restart Eliquis in 72 hours (04/25/2022 AM)                            to decrease post interventional bleeding risk.                           - Increase to omeprazole 40 mg twice daily for 1                            month. Then may decrease back to 20 mg twice daily                            as her baseline thereafter.                           - Carafate twice daily x2 weeks.                           - If final pathology does show evidence of                             neuroendocrine tumor, then will need repeat                            chromogranin a in a few months time when she can                            come off PPI therapy.                           - Pending final pathology, will decide  what                            surveillance interval may be 6 to 12 months with                            EUS (if neuroendocrine tumor is found).                           - The findings and recommendations were discussed                            with the patient. Procedure Code(s):        --- Professional ---                           [phone], Esophagogastroduodenoscopy, flexible,                            transoral; with endoscopic mucosal resection                           43237, Esophagogastroduodenoscopy, flexible,                            transoral; with endoscopic ultrasound examination                            limited to the esophagus, stomach or duodenum, and                            adjacent structures Diagnosis Code(s):        --- Professional ---                           K22.8, Other specified diseases of esophagus                           K31.89, Other diseases of stomach and duodenum                           I89.9, Noninfective disorder of lymphatic vessels                            and lymph nodes, unspecified CPT copyright 2019 American Medical Association. All rights reserved. The codes documented in this report are preliminary and upon coder review may  be revised to meet current compliance requirements. Justice Britain, MD 04/22/2022 10:17:45 AM Number of Addenda: 0

## 2022-04-22 NOTE — Discharge Instructions (Signed)
YOU HAD AN ENDOSCOPIC PROCEDURE TODAY: Refer to the procedure report and other information in the discharge instructions given to you for any specific questions about what was found during the examination. If this information does not answer your questions, please call [place] office at [phone] to clarify.   YOU SHOULD EXPECT: Some feelings of bloating in the abdomen. Passage of more gas than usual. Walking can help get rid of the air that was put into your GI tract during the procedure and reduce the bloating. If you had a lower endoscopy (such as a colonoscopy or flexible sigmoidoscopy) you may notice spotting of blood in your stool or on the toilet paper. Some abdominal soreness may be present for a day or two, also.  DIET: Your first meal following the procedure should be a light meal and then it is ok to progress to your normal diet. A half-sandwich or bowl of soup is an example of a good first meal. Heavy or fried foods are harder to digest and may make you feel nauseous or bloated. Drink plenty of fluids but you should avoid alcoholic beverages for 24 hours. If you had a esophageal dilation, please see attached instructions for diet.    ACTIVITY: Your care partner should take you home directly after the procedure. You should plan to take it easy, moving slowly for the rest of the day. You can resume normal activity the day after the procedure however YOU SHOULD NOT DRIVE, use power tools, machinery or perform tasks that involve climbing or major physical exertion for 24 hours (because of the sedation medicines used during the test).   SYMPTOMS TO REPORT IMMEDIATELY: A gastroenterologist can be reached at any hour. Please call [phone]  for any of the following symptoms:   Following upper endoscopy (EGD, EUS, ERCP, esophageal dilation) Vomiting of blood or coffee ground material  New, significant abdominal pain  New, significant chest pain or pain under the shoulder blades  Painful or  persistently difficult swallowing  New shortness of breath  Black, tarry-looking or red, bloody stools  FOLLOW UP:  If any biopsies were taken you will be contacted by phone or by letter within the next 1-3 weeks. Call [phone]  if you have not heard about the biopsies in 3 weeks.  Please also call with any specific questions about appointments or follow up tests.  

## 2022-04-22 NOTE — Transfer of Care (Signed)
Immediate Anesthesia Transfer of Care Note  Patient: Theresa Pearson  Procedure(s) Performed: UPPER ENDOSCOPIC ULTRASOUND (EUS) RADIAL ESOPHAGOGASTRODUODENOSCOPY (EGD) WITH PROPOFOL ENDOSCOPIC MUCOSAL RESECTION SUBMUCOSAL LIFTING INJECTION POLYPECTOMY HEMOSTASIS CLIP PLACEMENT  Patient Location: PACU  Anesthesia Type:MAC  Level of Consciousness: awake, alert , oriented and patient cooperative  Airway & Oxygen Therapy: Patient Spontanous Breathing and Patient connected to face mask oxygen  Post-op Assessment: Report given to RN and Post -op Vital signs reviewed and stable  Post vital signs: Reviewed and stable  Last Vitals:  Vitals Value Taken Time  BP 118/55 04/22/22 1002  Temp 36.7 C 04/22/22 1002  Pulse 75 04/22/22 1007  Resp 16 04/22/22 1007  SpO2 97 % 04/22/22 1007  Vitals shown include unvalidated device data.  Last Pain:  Vitals:   04/22/22 1002  TempSrc: Temporal  PainSc: 0-No pain      Patients Stated Pain Goal: 4 (26/33/35 4562)  Complications: No notable events documented.

## 2022-04-22 NOTE — H&P (Signed)
GASTROENTEROLOGY PROCEDURE H&P NOTE   Primary Care Physician: Dianna Rossetti, NP  HPI: Theresa Pearson is a 56 y.o. female who presents for EGD/EUS to evaluate a SEL of the duodenum, concern for possible NET but negative pathology, slightly elevated Chromogranin A, will consider possible resection pending size/evaluation at EUS.  Past Medical History:  Diagnosis Date   Allergy    seasonal   Anemia    Ankylosing spondylitis (HCC)    Anxiety    Arthritis    Asthma    Blood transfusion without reported diagnosis    Cataract    LEFT EYE   Chronic headaches    Clotting disorder (HCC)    COPD (chronic obstructive pulmonary disease) (HCC)    Depression    Diverticulosis    Fibromyalgia    Gallstones    GERD (gastroesophageal reflux disease)    Heart murmur    Hyperlipidemia    Hypertension    Oxygen deficiency    oxygen at 2L,JAN 2021   Pneumonia due to COVID-19 virus 07/2020   Right leg DVT ([place])    Tachycardia    Past Surgical History:  Procedure Laterality Date   ABDOMINAL HYSTERECTOMY  2008   for  fibroids    BIOPSY  02/14/2022   Procedure: BIOPSY;  Surgeon: Yetta Flock, MD;  Location: [facility];  Service: Gastroenterology;;  EGD and COLON   CESAREAN SECTION     CHOLECYSTECTOMY     COLONOSCOPY WITH PROPOFOL N/A 02/14/2022   Procedure: COLONOSCOPY WITH PROPOFOL;  Surgeon: Yetta Flock, MD;  Location: [facility];  Service: Gastroenterology;  Laterality: N/A;   ESOPHAGOGASTRODUODENOSCOPY (EGD) WITH PROPOFOL N/A 02/14/2022   Procedure: ESOPHAGOGASTRODUODENOSCOPY (EGD) WITH PROPOFOL;  Surgeon: Yetta Flock, MD;  Location: [facility];  Service: Gastroenterology;  Laterality: N/A;   POLYPECTOMY  02/14/2022   Procedure: POLYPECTOMY;  Surgeon: Yetta Flock, MD;  Location: [facility];  Service: Gastroenterology;;   THROMBECTOMY W/ EMBOLECTOMY Right 08/23/2020   Procedure: Right Upper Extremity Thrombectomy;  Surgeon: Marty Heck, MD;   Location: [facility] OR;  Service: Vascular;  Laterality: Right;   Current Facility-Administered Medications  Medication Dose Route Frequency Provider Last Rate Last Admin   0.9 %  sodium chloride infusion   Intravenous Continuous Mansouraty, Telford Nab., MD       lactated ringers infusion   Intravenous Continuous Mansouraty, Telford Nab., MD        Current Facility-Administered Medications:    0.9 %  sodium chloride infusion, , Intravenous, Continuous, Mansouraty, Telford Nab., MD   lactated ringers infusion, , Intravenous, Continuous, Mansouraty, Telford Nab., MD Allergies  Allergen Reactions   Penicillins Shortness Of Breath and Rash   Family History  Problem Relation Age of Onset   CAD Mother    Diabetes Mellitus II Mother    Stroke Mother    Colon polyps Mother    Crohn's disease Mother    Allergy (severe) Sister    Colon polyps Sister    Diabetes Maternal Grandmother    Hypertension Maternal Grandmother    Heart attack Maternal Grandmother    Prostate cancer Maternal Grandfather    Allergy (severe) Son    Colon cancer Neg Hx    Esophageal cancer Neg Hx    Rectal cancer Neg Hx    Stomach cancer Neg Hx    Ulcerative colitis Neg Hx    Social History   Socioeconomic History   Marital status: Divorced    Spouse name: Not on file  Number of children: 2   Years of education: Not on file   Highest education level: Not on file  Occupational History   Occupation: truck driver  Tobacco Use   Smoking status: Never    Passive exposure: Past   Smokeless tobacco: Never  Vaping Use   Vaping Use: Never used  Substance and Sexual Activity   Alcohol use: Not Currently    Alcohol/week: 1.0 - 2.0 standard drink of alcohol    Types: 1 - 2 Glasses of wine per week    Comment: occoasional.   Drug use: No   Sexual activity: Yes  Other Topics Concern   Not on file  Social History Narrative   Lives with 2 sons.    Social Determinants of Health   Financial Resource Strain: Not on  file  Food Insecurity: Not on file  Transportation Needs: Not on file  Physical Activity: Not on file  Stress: Not on file  Social Connections: Not on file  Intimate Partner Violence: Not on file    Physical Exam: Today's Vitals   04/17/22 1157 04/22/22 0730  BP:  128/69  Resp:  18  Temp:  97.8 F (36.6 C)  TempSrc:  Temporal  SpO2:  99%  Weight: 135 kg 129.7 kg  Height:  '5\' 5"'$  (1.651 m)  PainSc:  4    Body mass index is 47.59 kg/m. GEN: NAD EYE: Sclerae anicteric ENT: MMM CV: Non-tachycardic GI: Soft, NT/ND NEURO:  Alert & Oriented x 3  Lab Results: No results for input(s): "WBC", "HGB", "HCT", "PLT" in the last 72 hours. BMET No results for input(s): "NA", "K", "CL", "CO2", "GLUCOSE", "BUN", "CREATININE", "CALCIUM" in the last 72 hours. LFT No results for input(s): "PROT", "ALBUMIN", "AST", "ALT", "ALKPHOS", "BILITOT", "BILIDIR", "IBILI" in the last 72 hours. PT/INR No results for input(s): "LABPROT", "INR" in the last 72 hours.   Impression / Plan: This is a 56 y.o.female who presents for EGD/EUS to evaluate a SEL of the duodenum, concern for possible NET but negative pathology, slightly elevated Chromogranin A, will consider possible resection pending size/evaluation at EUS.  The risks of an EUS including intestinal perforation, bleeding, infection, aspiration, and medication effects were discussed as was the possibility it may not give a definitive diagnosis if a biopsy is performed.  When a biopsy of the pancreas is done as part of the EUS, there is an additional risk of pancreatitis at the rate of about 1-2%.  It was explained that procedure related pancreatitis is typically mild, although it can be severe and even life threatening, which is why we do not perform random pancreatic biopsies and only biopsy a lesion/area we feel is concerning enough to warrant the risk.  The risks and benefits of endoscopic evaluation/treatment were discussed with the patient and/or  family; these include but are not limited to the risk of perforation, infection, bleeding, missed lesions, lack of diagnosis, severe illness requiring hospitalization, as well as anesthesia and sedation related illnesses.  The patient's history has been reviewed, patient examined, no change in status, and deemed stable for procedure.  The patient and/or family is agreeable to proceed.    Justice Britain, MD [facility] Office # [phone]

## 2022-04-22 NOTE — Anesthesia Preprocedure Evaluation (Signed)
Anesthesia Evaluation  Patient identified by MRN, date of birth, ID band Patient awake    Reviewed: Allergy & Precautions, NPO status , Patient's Chart, lab work & pertinent test results  History of Anesthesia Complications Negative for: history of anesthetic complications  Airway Mallampati: III  TM Distance: >3 FB Neck ROM: Full    Dental  (+) Dental Advisory Given, Teeth Intact   Pulmonary asthma , sleep apnea , COPD,  oxygen dependent,     + decreased breath sounds      Cardiovascular hypertension, Pt. on medications (-) angina(-) Past MI and (-) CHF  Rhythm:Regular  Left ventricle: The cavity size was normal. Systolic function was  vigorous. The estimated ejection fraction was in the range of 65%  to 70%. Wall motion was normal; there were no regional wall  motion abnormalities. Left ventricular diastolic function  parameters were normal.  - Aortic valve: Trileaflet; normal thickness leaflets. There was no  regurgitation.  - Aortic root: The aortic root was normal in size.  - Left atrium: The atrium was normal in size.  - Right ventricle: The cavity size was normal. Wall thickness was  normal. Systolic function was normal.  - Right atrium: The atrium was normal in size.  - Tricuspid valve: There was mild regurgitation.  - Pulmonic valve: Structurally normal valve. There was no  regurgitation.  - Pulmonary arteries: Systolic pressure was within the normal  range.  - Inferior vena cava: The vessel was normal in size.  - Pericardium, extracardiac: There was no pericardial effusion.    Neuro/Psych  Headaches, PSYCHIATRIC DISORDERS Anxiety Depression  Neuromuscular disease    GI/Hepatic Neg liver ROS, GERD  ,  Endo/Other  Morbid obesity  Renal/GU negative Renal ROS     Musculoskeletal  (+) Arthritis , Fibromyalgia -  Abdominal   Peds  Hematology  (+) Blood dyscrasia, , Lab Results       Component                Value               Date                      WBC                      13.5 (H)            08/27/2021                HGB                      12.5                08/27/2021                HCT                      40.1                08/27/2021                MCV                      71.7 (L)            08/27/2021                PLT  359                 08/27/2021             eliquis for DVT   Anesthesia Other Findings   Reproductive/Obstetrics                             Anesthesia Physical Anesthesia Plan  ASA: 3  Anesthesia Plan: MAC   Post-op Pain Management: Minimal or no pain anticipated   Induction: Intravenous  PONV Risk Score and Plan: 2 and Propofol infusion and Treatment may vary due to age or medical condition  Airway Management Planned: Nasal Cannula and Natural Airway  Additional Equipment: None  Intra-op Plan:   Post-operative Plan: Extubation in OR  Informed Consent: I have reviewed the patients History and Physical, chart, labs and discussed the procedure including the risks, benefits and alternatives for the proposed anesthesia with the patient or authorized representative who has indicated his/her understanding and acceptance.     Dental advisory given  Plan Discussed with: CRNA  Anesthesia Plan Comments:         Anesthesia Quick Evaluation

## 2022-04-22 NOTE — Anesthesia Procedure Notes (Signed)
Procedure Name: Intubation Date/Time: 04/22/2022 9:08 AM  Performed by: Cleda Daub, CRNAPre-anesthesia Checklist: Patient identified, Emergency Drugs available, Suction available and Patient being monitored Patient Re-evaluated:Patient Re-evaluated prior to induction Oxygen Delivery Method: Circle system utilized Preoxygenation: Pre-oxygenation with 100% oxygen Induction Type: IV induction and Rapid sequence Laryngoscope Size: Mac and 3 Grade View: Grade I Tube type: Oral Tube size: 7.0 mm Number of attempts: 1 Airway Equipment and Method: Stylet and Oral airway Placement Confirmation: ETT inserted through vocal cords under direct vision, positive ETCO2 and breath sounds checked- equal and bilateral Secured at: 21 cm Tube secured with: Tape Dental Injury: Teeth and Oropharynx as per pre-operative assessment

## 2022-04-25 ENCOUNTER — Encounter (HOSPITAL_COMMUNITY): Payer: Self-pay | Admitting: Gastroenterology

## 2022-04-25 ENCOUNTER — Encounter: Payer: Self-pay | Admitting: Gastroenterology

## 2022-04-26 ENCOUNTER — Other Ambulatory Visit: Payer: Self-pay

## 2022-04-26 DIAGNOSIS — K319 Disease of stomach and duodenum, unspecified: Secondary | ICD-10-CM

## 2022-04-26 LAB — SURGICAL PATHOLOGY

## 2022-05-06 ENCOUNTER — Other Ambulatory Visit: Payer: Self-pay

## 2022-05-06 MED ORDER — COLESTIPOL HCL 1 G PO TABS: 1.0000 g | ORAL_TABLET | Freq: Two times a day (BID) | ORAL | 2 refills | Status: AC

## 2022-05-06 NOTE — Progress Notes (Signed)
Refill request for Colestid BID

## 2022-05-15 ENCOUNTER — Telehealth: Payer: Self-pay | Admitting: Pulmonary Disease

## 2022-05-15 DIAGNOSIS — R0609 Other forms of dyspnea: Secondary | ICD-10-CM

## 2022-05-15 DIAGNOSIS — J9601 Acute respiratory failure with hypoxia: Secondary | ICD-10-CM

## 2022-05-17 ENCOUNTER — Other Ambulatory Visit (HOSPITAL_COMMUNITY): Payer: Self-pay

## 2022-05-17 NOTE — Telephone Encounter (Signed)
Spoke with pt and verified DME as [facility] in [place]. POC sent to verified DME. Pt stated understanding. Nothing further needed at this time.

## 2022-05-17 NOTE — Telephone Encounter (Signed)
Okay to send order.  She should also try to get established with a pulmonary practice in the area that she moved to for future management.

## 2022-05-18 ENCOUNTER — Other Ambulatory Visit (HOSPITAL_COMMUNITY): Payer: Self-pay

## 2022-05-22 ENCOUNTER — Ambulatory Visit (HOSPITAL_BASED_OUTPATIENT_CLINIC_OR_DEPARTMENT_OTHER): Payer: Medicaid Other | Admitting: Pulmonary Disease

## 2022-06-04 ENCOUNTER — Other Ambulatory Visit (HOSPITAL_COMMUNITY): Payer: Self-pay

## 2022-06-07 ENCOUNTER — Other Ambulatory Visit (HOSPITAL_COMMUNITY): Payer: Self-pay

## 2022-06-18 ENCOUNTER — Other Ambulatory Visit (HOSPITAL_COMMUNITY): Payer: Self-pay

## 2022-06-18 MED ORDER — ALLOPURINOL 100 MG PO TABS
100.0000 mg | ORAL_TABLET | Freq: Every day | ORAL | 0 refills | Status: DC
  Filled 2022-06-18: qty 30, 30d supply, fill #0

## 2022-06-18 MED ORDER — ALLOPURINOL 100 MG PO TABS
100.0000 mg | ORAL_TABLET | Freq: Every day | ORAL | 0 refills | Status: AC
  Filled 2022-06-18 – 2022-08-15 (×2): qty 30, 30d supply, fill #0

## 2022-07-04 ENCOUNTER — Telehealth: Payer: Self-pay | Admitting: Pulmonary Disease

## 2022-07-04 DIAGNOSIS — J9601 Acute respiratory failure with hypoxia: Secondary | ICD-10-CM

## 2022-07-04 NOTE — Telephone Encounter (Signed)
Liz Malady; Katherina Right, [place]; [place], Leory Plowman; [place], [place] Please have POC Eval updated with below verbiage.  Thanks you!  "If new O2 start,, Please evaluate and titrate for best fit POC or portable O2 system. RT to evaluate and titrate patient for POC or homefill with OCD maintain sats >/=90%. if patient qualifies, dispense POC or homefill with OCD 1-5 pulse dose."

## 2022-07-04 NOTE — Telephone Encounter (Signed)
Called [employer] and they stated they needed update POC order with verbiage that dictates to evaluate for POC. New order for POC placed with correct verbiage [employer] is needing. Order placed for MR to sign since Dr Halford Chessman is out of office on vacation. Nothing further needed

## 2022-07-31 ENCOUNTER — Other Ambulatory Visit: Payer: Self-pay

## 2022-07-31 ENCOUNTER — Telehealth: Payer: Self-pay | Admitting: Pulmonary Disease

## 2022-07-31 NOTE — Telephone Encounter (Signed)
Rec'd request from [place] attorney's office for medical records going back to 2020 to support Social Security disability application.  Sent return fax with instructions to put request in to [place] - fax# [phone]

## 2022-08-16 ENCOUNTER — Other Ambulatory Visit: Payer: Self-pay

## 2022-08-19 ENCOUNTER — Encounter (HOSPITAL_BASED_OUTPATIENT_CLINIC_OR_DEPARTMENT_OTHER): Payer: Self-pay | Admitting: Pulmonary Disease

## 2022-08-19 ENCOUNTER — Ambulatory Visit (INDEPENDENT_AMBULATORY_CARE_PROVIDER_SITE_OTHER): Payer: Medicaid Other | Admitting: Pulmonary Disease

## 2022-08-19 ENCOUNTER — Other Ambulatory Visit: Payer: Medicaid Other

## 2022-08-19 VITALS — BP 150/90 | HR 79 | Temp 98.6°F | Ht 65.0 in | Wt 292.4 lb

## 2022-08-19 DIAGNOSIS — R0683 Snoring: Secondary | ICD-10-CM | POA: Diagnosis not present

## 2022-08-19 DIAGNOSIS — J9611 Chronic respiratory failure with hypoxia: Secondary | ICD-10-CM

## 2022-08-19 DIAGNOSIS — J9612 Chronic respiratory failure with hypercapnia: Secondary | ICD-10-CM

## 2022-08-19 DIAGNOSIS — K319 Disease of stomach and duodenum, unspecified: Secondary | ICD-10-CM

## 2022-08-19 DIAGNOSIS — E662 Morbid (severe) obesity with alveolar hypoventilation: Secondary | ICD-10-CM | POA: Diagnosis not present

## 2022-08-19 NOTE — Patient Instructions (Signed)
Will arrange for in lab sleep study at [facility]  Follow up in 6 months

## 2022-08-19 NOTE — Progress Notes (Signed)
[facility]  Chief Complaint  Patient presents with   Acute Visit    Ed said CO2 was high    Past Surgical History:  She  has a past surgical history that includes Cesarean section; Cholecystectomy; Abdominal hysterectomy (2008); Thrombectomy w/ embolectomy (Right, 08/23/2020); Colonoscopy with propofol (N/A, 02/14/2022); Esophagogastroduodenoscopy (egd) with propofol (N/A, 02/14/2022); biopsy (02/14/2022); polypectomy (02/14/2022); EUS (N/A, 04/22/2022); Esophagogastroduodenoscopy (egd) with propofol (N/A, 04/22/2022); Endoscopic mucosal resection (N/A, 04/22/2022); Submucosal lifting injection (04/22/2022); and Hemostasis clip placement (04/22/2022).  Past Medical History:  Anemia, Anxiety, OA, Headaches, Depression, Diverticulosis, Fibromyalgia, Gallstones, GERD, HTN, Pneumonia, Rt leg DVT 1994, Asthma, COVID 19 pneumonia January 2022, Ankylosing spondylitis  Constitutional:  BP (!) 150/90 (BP Location: Left Wrist, Cuff Size: Normal)   Pulse 79   Temp 98.6 F (37 C) (Oral)   Ht '5\' 5"'$  (1.651 m)   Wt 292 lb 7 oz (132.6 kg)   SpO2 94% Comment: room air  BMI 48.66 kg/m   Brief Summary:  Theresa Pearson is a 57 y.o. female with asthma, and hypoxia after having COVID 19 pneumonia in January 2022.      Subjective:   She was seen at [facility] on 08/14/22 for dizziness from vertigo.  Lab work showed increase CO2, and low potassium and phosphorus.  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  She continues to have diarrhea.  Breathing has been okay.  Not having cough, wheeze, or sputum.  She has been snoring more, and waking up hearing herself snore.    She maintained her SpO2 > 90% on room air while walking in office today.  Physical Exam:   Appearance - well kempt, wearing oxygen  ENMT - no sinus tenderness, no oral exudate, no LAN, Mallampati 3 airway, no stridor  Respiratory - equal breath  sounds bilaterally, no wheezing or rales  CV - s1s2 regular rate and rhythm, no murmurs  Ext - no clubbing, no edema  Skin - no rashes  Psych - normal mood and affect        Pulmonary testing:  Spirometry 01/06/21 >> FEV1 1.95 (83%), FEV1% 85 PFT 05/21/21 >> FEV1 2.27 (97%), FEV1% 92, TLC 3.65 (70%), DLCO 101%  Chest Imaging:  CT chest 08/08/20 >> peripheral predominant GGO typical of COVID 19 pneumonia CT angio chest 08/30/20 >> ATX at bases  Sleep Tests:  PSG 10/31/15 >> AHI 2.5, SpO2 low 82% PSG 02/20/21 >> AHI 0.3, SpO2 low 95%.  Used 2 liters O2. ONO with RA 09/05/21 >> test time 6 hrs 6 min.  Baseline SpO2 88%, low SpO2 74%.  Spent 3 hrs 44 min with SpO2 < 88%.  Cardiac Tests:  Echo bubble 08/24/20 >> EF 65 to 70%, mild RA dilation, no shunt  Social History:  She  reports that she has never smoked. She has been exposed to tobacco smoke. She has never used smokeless tobacco. She reports that she does not currently use alcohol after a past usage of about 1.0 - 2.0 standard drink of alcohol per week. She reports that she does not use drugs.  Family History:  Her family history includes Allergy (severe) in her sister and son; CAD in her mother; Colon polyps in her mother and sister; Crohn's disease in her mother; Diabetes in her maternal grandmother; Diabetes Mellitus II in her mother; Heart attack in her maternal grandmother; Hypertension in her maternal grandmother; Prostate cancer in her maternal grandfather; Stroke in  her mother.     Assessment/Plan:   Severe, persistent asthma. - continue symbicort 160 two puffs bid and singulair 10 mg qhs - prn albuterol  Snoring. - associated with sleep disruption, apnea and daytime sleepiness - she has elevated HCO3 on recent BMET - I am concerned she has progression of her sleep disordered breathing and needs a repeat in lab split night sleep study to assess further - she would like to get this set up in [place] with [facility], but continue follow up with [facility]  Obesity hypoventilation syndrome. - didn't have oxygen desaturation with walking today - will assess whether she still needs supplemental oxygen at night with in lab sleep study  - if she still needs supplemental oxygen at night, then she will need to get a new DME in [place]; she should like to use [employer]  Benign positional vertigo. 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 If you have any questions, ask your nurse or doctor.          allopurinol 100 MG tablet Commonly known as: ZYLOPRIM Take 1 tablet by mouth daily.   allopurinol 100 MG tablet Commonly known as: ZYLOPRIM Take 1 tablet (100 mg total) by mouth daily.   apixaban 5 MG Tabs tablet Commonly known as: Eliquis Take 1 tablet (5 mg total) by mouth 2 (two) times daily.   atorvastatin 10 MG tablet Commonly known as: LIPITOR Take 1 tablet by mouth every evening   busPIRone 5 MG tablet Commonly known as: BUSPAR Take 1 tablet (5 mg total) by mouth 2 (two) times daily. What changed: when to take this   chlorthalidone 50 MG tablet Commonly known as: HYGROTON Take 1 tablet (50 mg total) by mouth every morning with food.   cholecalciferol 25 MCG (1000 UNIT) tablet Commonly known as: VITAMIN D3 Take 1,000 Units by mouth daily.   colestipol 1 g tablet Commonly known as: COLESTID Take 1  tablet (1 g total) by mouth 2 (two) times daily.   cyanocobalamin 1000 MCG tablet Commonly known as: VITAMIN B12 Take 1,000 mcg by mouth daily.   diclofenac sodium 1 % Gel Commonly known as: Voltaren Apply 2 g topically 4 (four) times daily. What changed: when to take this   diltiazem 2 % Gel Apply a pea sized amount per rectum to the first knuckle three times daily What changed:  how much to take how to take this when to take this additional instructions   DULoxetine 60 MG capsule Commonly known as: CYMBALTA Take 1 capsule by mouth daily.   ferrous sulfate 325 (65 FE) MG tablet TAKE 1 TABLET (325 MG TOTAL) BY MOUTH DAILY WITH BREAKFAST. What changed: how much to take   gabapentin 300 MG capsule Commonly known as: NEURONTIN Take 1 capsule (300 mg total)  by mouth at bedtime for 3 nights. Then increase to 1 capsule two times daily. What changed:  how much to take how to take this when to take this additional instructions   lisinopril 40 MG tablet Commonly known as: ZESTRIL Take 1 tablet by mouth daily   metoprolol tartrate 50 MG tablet Commonly known as: LOPRESSOR Take 1 tablet (50 mg total) by mouth 2 (two) times daily.   montelukast 10 MG tablet Commonly known as: SINGULAIR Take 1 tablet (10 mg total) by mouth at bedtime.   MULTIVITAMIN PO Take 1 tablet by mouth daily.   omeprazole 40 MG capsule Commonly known as: PRILOSEC Take 1 capsule (40 mg total) by mouth 2 (two) times daily before a meal.   OVER THE COUNTER MEDICATION daily. SOURSOP LEAVES CUP OF TEA ONCE A DAY   potassium chloride SA 20 MEQ tablet Commonly known as: KLOR-CON M Take 1 tablet by mouth twice daily with food   sucralfate 1 g tablet Commonly known as: Carafate Take 1 tablet (1 g total) by mouth 2 (two) times daily for 14 days.   Symbicort 160-4.5 MCG/ACT inhaler Generic drug: budesonide-formoterol Inhale 2 puffs into the lungs in the morning and at bedtime.   Ventolin HFA 108  (90 Base) MCG/ACT inhaler Generic drug: albuterol Inhale 2 puffs by mouth every 4-6 hours as needed   zinc gluconate 50 MG tablet Take 50 mg by mouth daily.        Signature:  Chesley Mires, MD [place] Pager - [phone] 08/19/2022, 2:24 PM

## 2022-08-21 LAB — CHROMOGRANIN A: Chromogranin A (ng/mL): 59.7 ng/mL (ref 0.0–101.8)

## 2022-08-27 ENCOUNTER — Encounter (INDEPENDENT_AMBULATORY_CARE_PROVIDER_SITE_OTHER): Payer: Self-pay

## 2022-08-27 ENCOUNTER — Telehealth (HOSPITAL_BASED_OUTPATIENT_CLINIC_OR_DEPARTMENT_OTHER): Payer: Self-pay | Admitting: Pulmonary Disease

## 2022-08-27 DIAGNOSIS — E662 Morbid (severe) obesity with alveolar hypoventilation: Secondary | ICD-10-CM

## 2022-08-27 DIAGNOSIS — J9611 Chronic respiratory failure with hypoxia: Secondary | ICD-10-CM

## 2022-08-27 DIAGNOSIS — R0683 Snoring: Secondary | ICD-10-CM

## 2022-08-27 NOTE — Telephone Encounter (Signed)
Can we find out anything on the in lab sleep study that Dr Halford Chessman ordered for this patient  Thank you

## 2022-09-04 NOTE — Telephone Encounter (Signed)
Patient calling back for update on authorization, Please call patient with update.

## 2022-09-10 ENCOUNTER — Encounter: Payer: Self-pay | Admitting: Gastroenterology

## 2022-09-12 ENCOUNTER — Telehealth: Payer: Self-pay | Admitting: Pulmonary Disease

## 2022-09-12 NOTE — Telephone Encounter (Signed)
I searched in patient's chart. I do see where a high CO2 was mentioned for back in January 2024 at [facility]. Unfortunately this was just a BMET and not a ABG. Per her chart, she has never had a ABG with [facility] or [facility].   (Please send response to triage and not directly to me, thanks)

## 2022-09-12 NOTE — Telephone Encounter (Signed)
[employer] calling regarding PA for a sleep study.  Verify if we have a documentation of the  Arterial  PC02 level  Her number is [phone] Her Name: Theresa Harrison A.  Please call today she say so this can be exposited.   Her fax is [phone] but best to upload on line.

## 2022-09-13 NOTE — Telephone Encounter (Signed)
Left message for patient to call back  

## 2022-09-13 NOTE — Telephone Encounter (Signed)
Let her know her insurance is requiring her to have an ABG prior to approving the sleep study.  Please schedule if she is agreeable.

## 2022-09-25 ENCOUNTER — Other Ambulatory Visit: Payer: Self-pay | Admitting: Gastroenterology

## 2022-10-09 NOTE — Telephone Encounter (Signed)
g

## 2022-10-31 NOTE — Telephone Encounter (Signed)
Theresa Pearson, April L, RN1 month ago   RB Once sleep study order is changed for polysomnography I can sch    Burna Forts, April L, RN1 month ago   SB Good Morning, [employer] [place] states CPT Code 45409 is NOT MEDICALLY Necessary. Patient was approved for 81191 - POLYSOM 6/> YRS 4/> PARAM  Procedure From - To Date 2022-09-04 - 2022-11-02.[facility] made this determination and these changes to the CPT Code and authorization request. Patient cannot have split night and order needs to be changed. Thanks    Polysomnography sleep order was placed.routing to [facility].

## 2022-11-07 NOTE — Telephone Encounter (Signed)
Called pt and she does not want sleep study completed   Sent as a FYI to sood   Appt cancelled   Thanks

## 2022-11-07 NOTE — Telephone Encounter (Signed)
Noted  

## 2022-11-27 ENCOUNTER — Encounter (HOSPITAL_BASED_OUTPATIENT_CLINIC_OR_DEPARTMENT_OTHER): Payer: Medicaid Other | Admitting: Pulmonary Disease

## 2022-12-11 ENCOUNTER — Ambulatory Visit (INDEPENDENT_AMBULATORY_CARE_PROVIDER_SITE_OTHER): Payer: Commercial Managed Care - POS | Admitting: Obstetrics & Gynecology

## 2023-03-10 ENCOUNTER — Other Ambulatory Visit: Payer: Self-pay | Admitting: Obstetrics and Gynecology

## 2023-03-10 DIAGNOSIS — N83209 Unspecified ovarian cyst, unspecified side: Secondary | ICD-10-CM

## 2023-03-31 ENCOUNTER — Ambulatory Visit: Payer: Commercial Managed Care - POS | Attending: Obstetrics & Gynecology | Admitting: Obstetrics & Gynecology

## 2023-03-31 ENCOUNTER — Ambulatory Visit (INDEPENDENT_AMBULATORY_CARE_PROVIDER_SITE_OTHER): Payer: Commercial Managed Care - POS | Admitting: Obstetrics & Gynecology

## 2023-03-31 ENCOUNTER — Encounter (INDEPENDENT_AMBULATORY_CARE_PROVIDER_SITE_OTHER): Payer: Self-pay | Admitting: Obstetrics & Gynecology

## 2023-03-31 VITALS — BP 114/68 | HR 71 | Temp 98.0°F | Resp 18 | Ht 65.0 in | Wt 141.1 lb

## 2023-03-31 DIAGNOSIS — R87615 Unsatisfactory cytologic smear of cervix: Secondary | ICD-10-CM

## 2023-03-31 DIAGNOSIS — Z01419 Encounter for gynecological examination (general) (routine) without abnormal findings: Secondary | ICD-10-CM

## 2023-03-31 DIAGNOSIS — Z1231 Encounter for screening mammogram for malignant neoplasm of breast: Secondary | ICD-10-CM

## 2023-03-31 NOTE — Progress Notes (Signed)
Subjective:       Elizabeth Dougherty is a 57 y.o. female here for a routine exam.  Current complaints: none.  Personal health questionnaire reviewed: yes. Pt denies any VB, d/c, dysuria, or dyspareunia. Nl BM's. No incontinence. Older daughter is a Holiday representative at [employer], in [place], younger daughter is a Printmaker at [employer] in [place], and son is in HS.     Gynecologic History  Patient's last menstrual period was 11/12/2017.  Contraception: none  Breast self exam: discussed  Common GYN tests  Last Pap Date: 12/07/21  Last Pap Result: Normal  Last Mammo Date: 04/16/22  Last Mammo Result: Normal  Last Colonoscopy Date: 07/03/21  Last Colonoscopy Result: Normal (Cologuard Normal repeat 2025)    The following portions of the patient's history were reviewed and updated as appropriate: allergies, current medications, past family history, past medical history, past social history, past surgical history and problem list.      Review of Systems  Pertinent items are noted in HPI.      Objective:      BP 114/68 (BP Site: Left arm, Patient Position: Sitting, Cuff Size: Small)   Pulse 71   Temp 98 F (36.7 C) (Oral)   Resp 18   Ht 5\' 5"  (1.651 m)   Wt 141 lb 1.5 oz (64 kg)   LMP 11/12/2017   SpO2 100%   BMI 23.48 kg/m   General appearance: alert, appears stated age and cooperative  Neck: no adenopathy, supple, symmetrical, trachea midline, and thyroid not enlarged, symmetric, no tenderness/mass/nodules  Breasts: normal appearance, no masses or tenderness, No nipple retraction or dimpling, No nipple discharge or bleeding, No axillary or supraclavicular adenopathy  Abdomen: soft, non-tender; bowel sounds normal; no masses,  no organomegaly  Pelvic exam:     Urinary system: urethral meatus normal   External genitalia: normal general appearance   Vaginal: normal rugae   Cervix: normal appearance   Adnexa: normal bimanual exam   Uterus: normal single, nontender   Rectal: good sphincter tone, no masses, and guaiac  negative          Assessment:      Healthy female exam.        Plan:      Mammogram ordered.  Follow up in: 1 year.  Thin prep pap/HPV Yes secondary to unsatisfactory Pap in 2023 with negative HR HPV

## 2023-04-02 LAB — LAB USE ONLY- GYN CYTOLOGY/PAP SMEAR

## 2023-04-02 LAB — HUMAN PAPILLOMA VIRUS (HPV) DNA, HIGH RISK
HPV Genotype 16 DNA: NEGATIVE
HPV Genotype 18 DNA: NEGATIVE
HPV High Risk Other DNA: NEGATIVE

## 2023-04-02 NOTE — Progress Notes (Signed)
Normal pap with negative HPV.

## 2023-07-17 ENCOUNTER — Other Ambulatory Visit: Payer: Self-pay | Admitting: Obstetrics & Gynecology

## 2023-10-31 ENCOUNTER — Ambulatory Visit (HOSPITAL_BASED_OUTPATIENT_CLINIC_OR_DEPARTMENT_OTHER): Admitting: Orthopaedic Surgery

## 2023-11-10 ENCOUNTER — Ambulatory Visit (INDEPENDENT_AMBULATORY_CARE_PROVIDER_SITE_OTHER)

## 2023-11-10 ENCOUNTER — Ambulatory Visit (INDEPENDENT_AMBULATORY_CARE_PROVIDER_SITE_OTHER): Admitting: Orthopaedic Surgery

## 2023-11-10 ENCOUNTER — Encounter (INDEPENDENT_AMBULATORY_CARE_PROVIDER_SITE_OTHER): Payer: Self-pay | Admitting: Orthopaedic Surgery

## 2023-11-10 DIAGNOSIS — M25562 Pain in left knee: Secondary | ICD-10-CM

## 2023-11-10 DIAGNOSIS — M1712 Unilateral primary osteoarthritis, left knee: Secondary | ICD-10-CM

## 2023-11-10 MED ORDER — MELOXICAM 15 MG PO TABS
15.0000 mg | ORAL_TABLET | Freq: Every day | ORAL | 1 refills | Status: AC
Start: 2023-11-10 — End: 2024-01-09

## 2023-11-10 NOTE — Progress Notes (Signed)
 [facility]    Provider: Lidia Reels, MD  Date of Exam:  11/10/2023   Patient:  Elizabeth Dougherty  DOB:  [birth year]    AGE:  58 y.o.  MR#:  [id number]     Chief Complaint: Left knee pain.    HPI:  Elizabeth Dougherty is a pleasant 58 y.o.-year-old female who presents today with a history of left knee pain that she has had for the last few months.  She notes the pain when she is wearing heels and walking for prolonged periods of time.  She also notes pain when doing lunges and this transition to doing backwards lunges wall in the gym.  She denies any mechanical symptoms.  She has not completed any treatment for her knee pain.  She is here today for further evaluation discussion of treatment options.    For other past medical history, social history, family history and past surgical history please see them listed below.    Problem List: Problem List[1]     Past Medical History: Medical History[2]    Social History: Social History[3]    Family History: Family History[4]    Past Surgical History: Past Surgical History[5]    Medications: Current Medications[6]    Allergies: Allergies[7]    ROS:  Constitutional: No fatigue, fever, weight loss, or weight gain.   Ears, Nose, Mouth & Throat: No sore throat or hearing loss.   Cardiovascular: No chest pain, blood clots, or leg cramps.   Respiratory: No shortness of breath, cough, or difficulty breathing.   Gastrointestinal: No nausea, vomiting, diarrhea, or loss of appetite.   Genitourinary: No polyuria or kidney disease.   Musculoskeletal: No joint aches, muscle weakness or swelling of joints/body parts other than that mentioned above/below.  Integumentary: No finger nail changes or skin dryness.   Neurological: No numbness, burning discomfort, or headaches.   Psychiatric: No depression or anxiety.   Endocrine: No increased thirst, change in appetite or thyroid  disease.   Hematologic/Lymphatic: No easy bruising or anemia.     EXAM:    Left Knee Exam:  Skin  intact  Erythema: None present  Swelling: None present  Effusion: None  Deformities: None  ROM: 0-135  Patellofemoral Crepitus: Negative  Patellar Apprehension at 30 deg: Negative  J Sign: Negative  Lachman 1A  Pivot Shift: Negative   Anterior Drawer: WNL  Posterior drawer: WNL  Dial Test at 30 WNL  at 90 WNL  Varus at 0 WNL at 30 WNL  Valgus at 0 WNL at 30 WNL  Medial McMurray's: Negative  Lateral McMurray's: Negative  Medial Joint Line TTP: Negative  Lateral Joint Line TTP: Negative  Strength: WNL    Other areas of Tenderness: None  DTR and Pathological Reflexes Intact  No lymphadenopathy  Sensation Intact to light touch all distributions of leg and foot  DF, PF, EHL motor intact  Foot Perfused with CR <2 sec  DP/PT pulse 2+    Right Knee Exam:  Skin intact  Erythema: None present  Swelling: None present  Effusion: None  Deformities: None  ROM: 0-135  Patellofemoral Crepitus: Negative  Patellar Apprehension at 30 deg: Negative  J Sign: Negative  Lachman 1A  Pivot Shift: Negative   Anterior Drawer: WNL  Posterior drawer: WNL  Dial Test at 30 WNL  at 90 WNL  Varus at 0 WNL at 30 WNL  Valgus at 0 WNL at 30 WNL  Medial McMurray's: Negative  Lateral McMurray's: Negative  Medial Joint  Line TTP: None  Lateral Joint Line TTP: None  Strength: WNL    Other areas of Tenderness: None  DTR and Pathological Reflexes Intact  No lymphadenopathy  Sensation Intact to light touch all distributions of leg and foot  DF, PF, EHL motor intact  Foot Perfused with CR <2 sec  DP/PT pulse 2+    Vitals: LMP 11/12/2017     General: Elizabeth Dougherty was awake, alert, oriented, easily engaged, displayed logical thinking with clear speech and was neat in appearance. Her general appearance was normal, well-developed and well-nourished.    Gait: The patient demonstrated non antalgic gait with intact coordination and balance.     Studies: Weightbearing AP, lateral, PA flexed and sunrise views of the left knee were ordered and reviewed on today's visit.   Joint space is narrowed in the medial compartment. There are Mild marginal osteophytes of the medial compartment and patellofemoral joint lines. There are no sclerotic and cystic changes. There are no acute bony abnormalities. The patellae are centralized in femoral sulci.       Assessment/Plan: Elizabeth Dougherty is a pleasant 58 y.o. female with historical and clinical evidence of left knee mild osteoarthritis.  The patient's diagnosis and treatment options were discussed in detail at today's visit. I have taken this opportunity to refer the patient to formal physical therapy.  I also prescribed Mobic which she will take 15 mg p.o. daily.  I have asked the patient to avoid high impact activities and deep knee bent positions if possible.  We have discussed the potential need for surgical intervention. We will plan on follow up 6-8 weeks .  The patient will notify my clinic of any changes or worsening of their symptoms during the interim.    Azzie Bollman ATC, was acting as a Neurosurgeon for provider Lidia Reels, MD  on patient Elizabeth Dougherty.   Lamona Pilon, ATC acted as a Neurosurgeon for this encounter for me Dr. Leafy Primrose.  I have reviewed and edited this note when appropriate and agree with the documentation.    45 minutes were spent on this encounter, either face-to-face with the patient and/or on the highlighted activities listed below:    Preparing for the visit (such as reviewing tests, notes, x-rays)  Getting and/or reviewing a history that was separately obtained  Performing the exam  Counseling and providing education to the patient, family, or caregiver  Documenting information in the medical record  Interpreting results and sharing that information with the patient, family or caregiver  Ordering medicines, tests, or procedures  Communicating with other healthcare professionals  Care coordination                [1]   Patient Active Problem List  Diagnosis   (none) - all problems resolved or deleted    [2]   Past Medical History:  Diagnosis Date    Abdominal hernia 2008    Abnormal Pap smear     Genital warts     Hemorrhoids without complication    [3]   Social History  Tobacco Use    Smoking status: Never    Smokeless tobacco: Never   Substance Use Topics    Alcohol use: Yes     Alcohol/week: 1.0 standard drink of alcohol     Types: 1 Glasses of wine per week     Comment: occasional    Drug use: Never   [4] No family history on file.  [5]   Past Surgical History:  Procedure Laterality Date    BREAST BIOPSY      CERVICAL BIOPSY  W/ LOOP ELECTRODE EXCISION     [6]   Current Outpatient Medications:     hydrOXYzine (ATARAX) 10 MG tablet, TAKE 1 TO 2 TABLETS BY MOUTH 45 MINUTES BEFORE BED AS NEEDED FOR SLEEP, Disp: , Rfl:     Multiple Vitamin (MULTI-VITAMIN DAILY PO), Multi Vitamin  1 po qd, Disp: , Rfl:     SUMAtriptan (IMITREX) 50 MG tablet, , Disp: , Rfl:     meloxicam (MOBIC) 15 MG tablet, Take 1 tablet (15 mg) by mouth once daily, Disp: 30 tablet, Rfl: 1  [7] No Known Allergies

## 2023-11-26 ENCOUNTER — Other Ambulatory Visit (INDEPENDENT_AMBULATORY_CARE_PROVIDER_SITE_OTHER): Payer: Self-pay

## 2023-11-26 ENCOUNTER — Encounter (INDEPENDENT_AMBULATORY_CARE_PROVIDER_SITE_OTHER): Payer: Self-pay

## 2023-11-26 DIAGNOSIS — M25562 Pain in left knee: Secondary | ICD-10-CM

## 2023-12-03 ENCOUNTER — Telehealth (INDEPENDENT_AMBULATORY_CARE_PROVIDER_SITE_OTHER): Payer: Self-pay

## 2023-12-03 ENCOUNTER — Encounter (INDEPENDENT_AMBULATORY_CARE_PROVIDER_SITE_OTHER): Payer: Self-pay | Admitting: Orthopaedic Surgery

## 2023-12-03 NOTE — Telephone Encounter (Signed)
 Per Carly J. @ [employer], MRI denial overturned.    AUTH NO.: [id number]    VALID: 11/28/2023 ~ 05/26/2024    Camellia Fossa @ [facility] contacted w/ updated info.

## 2023-12-11 ENCOUNTER — Other Ambulatory Visit (INDEPENDENT_AMBULATORY_CARE_PROVIDER_SITE_OTHER): Payer: Self-pay | Admitting: Orthopaedic Surgery

## 2024-01-02 ENCOUNTER — Ambulatory Visit (HOSPITAL_BASED_OUTPATIENT_CLINIC_OR_DEPARTMENT_OTHER): Admitting: Orthopaedic Surgery

## 2024-01-02 ENCOUNTER — Encounter (HOSPITAL_BASED_OUTPATIENT_CLINIC_OR_DEPARTMENT_OTHER): Payer: Self-pay | Admitting: Orthopaedic Surgery

## 2024-01-02 DIAGNOSIS — M25562 Pain in left knee: Secondary | ICD-10-CM

## 2024-01-02 NOTE — Progress Notes (Signed)
 [facility]    Provider: Reyes JONELLE Bruch, MD  Date of Exam:  01/02/2024   Patient:  Elizabeth Dougherty  DOB:  March 09, 1966    AGE:  58 y.o.  MR#:  [id number]     Chief Complaint: Left knee pain.    HPI:  Elizabeth Dougherty is a pleasant 58 y.o.-year-old female who presents today with a history of left knee pain that she has had for the last few months.  She notes the pain when she is wearing heels and walking for prolonged periods of time.  She also notes pain when doing lunges and this transition to doing backwards lunges wall in the gym.  She denies any mechanical symptoms.  She has not completed any treatment for her knee pain.  She is here today for further evaluation discussion of treatment options.    For other past medical history, social history, family history and past surgical history please see them listed below.    Problem List: Problem List[1]     Past Medical History: Medical History[2]    Social History: Social History[3]    Family History: Family History[4]    Past Surgical History: Past Surgical History[5]    Medications: Current Medications[6]    Allergies: Allergies[7]    ROS:  Constitutional: No fatigue, fever, weight loss, or weight gain.   Ears, Nose, Mouth & Throat: No sore throat or hearing loss.   Cardiovascular: No chest pain, blood clots, or leg cramps.   Respiratory: No shortness of breath, cough, or difficulty breathing.   Gastrointestinal: No nausea, vomiting, diarrhea, or loss of appetite.   Genitourinary: No polyuria or kidney disease.   Musculoskeletal: No joint aches, muscle weakness or swelling of joints/body parts other than that mentioned above/below.  Integumentary: No finger nail changes or skin dryness.   Neurological: No numbness, burning discomfort, or headaches.   Psychiatric: No depression or anxiety.   Endocrine: No increased thirst, change in appetite or thyroid  disease.   Hematologic/Lymphatic: No easy bruising or anemia.     EXAM:    Left Knee Exam:  Skin  intact  Erythema: None present  Swelling: None present  Effusion: None  Deformities: None  ROM: 0-135  Patellofemoral Crepitus: Negative  Patellar Apprehension at 30 deg: Negative  J Sign: Negative  Lachman 1A  Pivot Shift: Negative   Anterior Drawer: WNL  Posterior drawer: WNL  Dial Test at 30 WNL  at 90 WNL  Varus at 0 WNL at 30 WNL  Valgus at 0 WNL at 30 WNL  Medial McMurray's: Negative  Lateral McMurray's: Negative  Medial Joint Line TTP: Negative  Lateral Joint Line TTP: Negative  Strength: WNL    Other areas of Tenderness: None  DTR and Pathological Reflexes Intact  No lymphadenopathy  Sensation Intact to light touch all distributions of leg and foot  DF, PF, EHL motor intact  Foot Perfused with CR <2 sec  DP/PT pulse 2+    Right Knee Exam:  Skin intact  Erythema: None present  Swelling: None present  Effusion: None  Deformities: None  ROM: 0-135  Patellofemoral Crepitus: Negative  Patellar Apprehension at 30 deg: Negative  J Sign: Negative  Lachman 1A  Pivot Shift: Negative   Anterior Drawer: WNL  Posterior drawer: WNL  Dial Test at 30 WNL  at 90 WNL  Varus at 0 WNL at 30 WNL  Valgus at 0 WNL at 30 WNL  Medial McMurray's: Negative  Lateral McMurray's: Negative  Medial Joint  Line TTP: None  Lateral Joint Line TTP: None  Strength: WNL    Other areas of Tenderness: None  DTR and Pathological Reflexes Intact  No lymphadenopathy  Sensation Intact to light touch all distributions of leg and foot  DF, PF, EHL motor intact  Foot Perfused with CR <2 sec  DP/PT pulse 2+    Vitals: LMP 11/12/2017     General: Elizabeth Dougherty was awake, alert, oriented, easily engaged, displayed logical thinking with clear speech and was neat in appearance. Her general appearance was normal, well-developed and well-nourished.    Gait: The patient demonstrated non antalgic gait with intact coordination and balance.     Studies: Weightbearing AP, lateral, PA flexed and sunrise views of the left knee were ordered and reviewed on today's visit.   Joint space is narrowed in the medial compartment. There are Mild marginal osteophytes of the medial compartment and patellofemoral joint lines. There are no sclerotic and cystic changes. There are no acute bony abnormalities. The patellae are centralized in femoral sulci.       Assessment/Plan: Elizabeth Dougherty is a pleasant 58 y.o. female with historical and clinical evidence of left knee mild osteoarthritis.  The patient's diagnosis and treatment options were discussed in detail at today's visit. I have taken this opportunity to refer the patient to formal physical therapy.  I also prescribed Mobic  which she will take 15 mg p.o. daily.  I have asked the patient to avoid high impact activities and deep knee bent positions if possible.  We have discussed the potential need for surgical intervention. We will plan on follow up 6-8 weeks .  The patient will notify my clinic of any changes or worsening of their symptoms during the interim.    I, Laymon Luo LAT, ATC, was acting as a Neurosurgeon for provider Reyes JONELLE Bruch, MD on patient Elizabeth Dougherty   [facility], ATC acted as a Neurosurgeon for this encounter for me Dr. Reyes Bruch.  I have reviewed and edited this note when appropriate and agree with the documentation.    45 minutes were spent on this encounter, either face-to-face with the patient and/or on the highlighted activities listed below:    Preparing for the visit (such as reviewing tests, notes, x-rays)  Getting and/or reviewing a history that was separately obtained  Performing the exam  Counseling and providing education to the patient, family, or caregiver  Documenting information in the medical record  Interpreting results and sharing that information with the patient, family or caregiver  Ordering medicines, tests, or procedures  Communicating with other healthcare professionals  Care coordination                [1]   Patient Active Problem List  Diagnosis   (none) - all problems resolved or deleted   [2]    Past Medical History:  Diagnosis Date    Abdominal hernia 2008    Abnormal Pap smear     Genital warts     Hemorrhoids without complication    [3]   Social History  Tobacco Use    Smoking status: Never    Smokeless tobacco: Never   Substance Use Topics    Alcohol use: Yes     Alcohol/week: 1.0 standard drink of alcohol     Types: 1 Glasses of wine per week     Comment: occasional    Drug use: Never   [4] No family history on file.  [5]   Past Surgical History:  Procedure  Laterality Date    BREAST BIOPSY      CERVICAL BIOPSY  W/ LOOP ELECTRODE EXCISION     [6]   Current Outpatient Medications:     hydrOXYzine (ATARAX) 10 MG tablet, TAKE 1 TO 2 TABLETS BY MOUTH 45 MINUTES BEFORE BED AS NEEDED FOR SLEEP, Disp: , Rfl:     meloxicam  (MOBIC ) 15 MG tablet, Take 1 tablet (15 mg) by mouth once daily, Disp: 30 tablet, Rfl: 1    Multiple Vitamin (MULTI-VITAMIN DAILY PO), Multi Vitamin  1 po qd, Disp: , Rfl:     SUMAtriptan (IMITREX) 50 MG tablet, , Disp: , Rfl:   [7] No Known Allergies

## 2024-01-05 ENCOUNTER — Encounter (INDEPENDENT_AMBULATORY_CARE_PROVIDER_SITE_OTHER): Payer: Self-pay

## 2024-01-06 ENCOUNTER — Encounter (INDEPENDENT_AMBULATORY_CARE_PROVIDER_SITE_OTHER): Payer: Self-pay

## 2024-01-23 ENCOUNTER — Ambulatory Visit (HOSPITAL_BASED_OUTPATIENT_CLINIC_OR_DEPARTMENT_OTHER): Admitting: Orthopaedic Surgery

## 2024-04-13 ENCOUNTER — Ambulatory Visit (INDEPENDENT_AMBULATORY_CARE_PROVIDER_SITE_OTHER): Payer: Commercial Managed Care - POS | Admitting: Obstetrics & Gynecology

## 2024-04-13 ENCOUNTER — Encounter (INDEPENDENT_AMBULATORY_CARE_PROVIDER_SITE_OTHER): Payer: Self-pay | Admitting: Obstetrics & Gynecology

## 2024-04-13 VITALS — BP 109/76 | HR 73 | Temp 97.6°F | Resp 15 | Ht 65.0 in | Wt 138.9 lb

## 2024-04-13 DIAGNOSIS — N949 Unspecified condition associated with female genital organs and menstrual cycle: Secondary | ICD-10-CM

## 2024-04-13 DIAGNOSIS — Z1231 Encounter for screening mammogram for malignant neoplasm of breast: Secondary | ICD-10-CM

## 2024-04-13 DIAGNOSIS — Z1211 Encounter for screening for malignant neoplasm of colon: Secondary | ICD-10-CM

## 2024-04-13 DIAGNOSIS — Z01419 Encounter for gynecological examination (general) (routine) without abnormal findings: Secondary | ICD-10-CM

## 2024-04-13 DIAGNOSIS — Z1212 Encounter for screening for malignant neoplasm of rectum: Secondary | ICD-10-CM

## 2024-04-13 MED ORDER — PROGESTERONE 100 MG PO CAPS
100.0000 mg | ORAL_CAPSULE | Freq: Every day | ORAL | 3 refills | Status: DC
Start: 2024-04-13 — End: 2024-05-04

## 2024-04-13 MED ORDER — ESTRADIOL 0.0375 MG/24HR TD PTTW
1.0000 | MEDICATED_PATCH | TRANSDERMAL | 3 refills | Status: DC
Start: 2024-04-13 — End: 2024-05-04

## 2024-04-13 NOTE — Progress Notes (Signed)
 Subjective:       Elizabeth Dougherty is a 58 y.o. female here for a routine exam.  Current complaints: hot flashes/sleep disruption.  Personal health questionnaire reviewed: yes. Pt denies any VB, d/c, or dysuria.Having some issues with hot flashes and sleep disruption. Not sexually active. Nl BM's. No incontinence. Older daughter is a Holiday representative at [employer], and landed a job with the [employer] Younger daughter is a Medical laboratory scientific officer at [employer] in [place], and son is a Holiday representative in [employer].     Gynecologic History  Patient's last menstrual period was 11/12/2017.  Contraception: none  Breast self exam: discussed  Common GYN tests  Last Pap Date: 03/31/23  Last Pap Result: Normal (neg HPV)  Last Mammo Date: 07/17/23  Last Mammo Result: Normal  Last Colonoscopy Date: 07/03/21  Last Colonoscopy Result: Normal (neg Cologuard; recheck due 2025; GI referral given today)  Last Dexa Date:  (Not due)  Last Dexa Result: N/A    The following portions of the patient's history were reviewed and updated as appropriate: allergies, current medications, past family history, past medical history, past social history, past surgical history and problem list.      Review of Systems  Pertinent items are noted in HPI.      Objective:      BP 109/76 (BP Site: Right arm, Patient Position: Sitting, Cuff Size: Medium)   Pulse 73   Temp 97.6 F (36.4 C) (Oral)   Resp 15   Ht 5' 5 (1.651 m)   Wt 138 lb 14.2 oz (63 kg)   LMP 11/12/2017   SpO2 99%   BMI 23.11 kg/m   General appearance: alert, appears stated age and cooperative  Neck: no adenopathy, supple, symmetrical, trachea midline, and thyroid  not enlarged, symmetric, no tenderness/mass/nodules  Breasts: normal appearance, no masses or tenderness, No nipple retraction or dimpling, No nipple discharge or bleeding, No axillary or supraclavicular adenopathy  Abdomen: soft, non-tender; bowel sounds normal; no masses,  no organomegaly  Pelvic exam:     Urinary system: urethral meatus  normal   External genitalia: normal general appearance   Vaginal: normal rugae   Cervix: normal appearance   Adnexa: normal bimanual exam   Uterus: normal single, nontender   Rectal: good sphincter tone, no masses, and guaiac negative          Assessment:      Healthy female exam.        Plan:      Mammogram ordered.  Follow up in: 1 year.  Thin prep pap/HPV No secondary to nl Pap in 2024 with negative HR HPV  Discussed risks vs benefits of HRT. Rx for HRT e scripted to her pharmacy  Pelvic sono ordered to f/u left ovarian cyst seen on sono in 2021   Recommended colon cancer screening. Reviewed colonoscopy vs Cologuard. Colonoscopy referral given.

## 2024-04-30 ENCOUNTER — Encounter (INDEPENDENT_AMBULATORY_CARE_PROVIDER_SITE_OTHER): Payer: Self-pay

## 2024-05-04 ENCOUNTER — Telehealth (INDEPENDENT_AMBULATORY_CARE_PROVIDER_SITE_OTHER): Payer: Self-pay

## 2024-05-04 MED ORDER — ESTRADIOL 0.0375 MG/24HR TD PTTW
1.0000 | MEDICATED_PATCH | TRANSDERMAL | 3 refills | Status: AC
Start: 2024-05-04 — End: ?

## 2024-05-04 MED ORDER — PROGESTERONE 100 MG PO CAPS
100.0000 mg | ORAL_CAPSULE | Freq: Every day | ORAL | 3 refills | Status: AC
Start: 2024-05-04 — End: ?

## 2024-05-04 NOTE — Telephone Encounter (Signed)
 RN called and spoke with pt who left VM.   Pt states she was given prescription for estradiol patch and progesterone but did not pick them up. Pt states [facility] to her prescription would need to be resent as they only keep on file for 2 weeks. E-script for Vivelle-Dot 0.0375 mg #24, RF#3 and Progesterone 100 mg #90, RF#3 sent to [facility] as per Dr. Sherlene original order.

## 2024-05-06 ENCOUNTER — Telehealth (INDEPENDENT_AMBULATORY_CARE_PROVIDER_SITE_OTHER): Payer: Self-pay

## 2024-05-06 NOTE — Telephone Encounter (Signed)
 Patient called with questions about HRT. All questions answered.

## 2024-05-11 ENCOUNTER — Ambulatory Visit (INDEPENDENT_AMBULATORY_CARE_PROVIDER_SITE_OTHER)

## 2024-05-11 ENCOUNTER — Encounter (INDEPENDENT_AMBULATORY_CARE_PROVIDER_SITE_OTHER): Payer: Self-pay

## 2024-05-11 VITALS — BP 112/79 | HR 78 | Temp 97.5°F | Resp 16 | Ht 64.0 in | Wt 138.6 lb

## 2024-05-11 DIAGNOSIS — H40219 Acute angle-closure glaucoma, unspecified eye: Secondary | ICD-10-CM

## 2024-05-11 DIAGNOSIS — H469 Unspecified optic neuritis: Secondary | ICD-10-CM

## 2024-05-11 DIAGNOSIS — G459 Transient cerebral ischemic attack, unspecified: Secondary | ICD-10-CM

## 2024-05-11 DIAGNOSIS — H3323 Serous retinal detachment, bilateral: Secondary | ICD-10-CM

## 2024-05-11 NOTE — Progress Notes (Signed)
 [facility]  OFFICE NOTE         Subjective   Historian: Patient      Chief Complaint   Patient presents with    Eye Problem     Pt notes that when she was driving today her vision went black first time that it happens. Pt has no medical hx or taking meds.         History of Present Illness  Elizabeth Dougherty is a 59 year old female who presents with sudden onset blurry vision while driving.    She experienced blurry vision that resolved spontaneously after 30 seconds, with no headache, dizziness, pain, or pressure. This was her first episode of visual disturbance. She frequently experiences headaches and migraines but was not having a migraine at the time. She is not aware of any diabetes diagnosis.    History:  Medications and Allergies reviewed.   Pertinent Past Medical, Surgical, Family and Social History were reviewed.          Objective   BP 112/79 (BP Site: Right arm, Patient Position: Sitting, Cuff Size: Medium)   Pulse 78   Temp 97.5 F (36.4 C) (Tympanic)   Resp 16   Ht 1.626 m (5' 4)   Wt 62.9 kg (138 lb 9.6 oz)   LMP 11/12/2017   SpO2 97%   BMI 23.79 kg/m   Physical Exam   Physical Exam  HEENT: Pupils equal, no conjunctival redness. No ear drainage.  Urgent Care Course   There were no labs reviewed with this patient during the visit.    There were no x-rays reviewed with this patient during the visit.      Procedures   Procedures     Assessment / Plan     Differential Diagnoses including but not limited to: Optic neuritis, Bilateral retinal detachment, Acute primary angle-closure glaucoma, unspecified laterality, TIA (transient ischemic attack)     Assessment & Plan  Vital signs within defined limits.  Alert and oriented x 4.  Patient states that she experienced 30 seconds of blurry vision while driving which caused her to pull over.  Denies any dizziness, headache or tinnitus.  Bilateral pupil PERRLA on assessment.  Currently not on any new medication that could cause  transient blurry vision.  No erythema or injection noted in bilateral conjunctival assessment.  History of migraine which is currently controlled.  Patient educated to go to the emergency room if symptoms reoccurs.  Stated understanding of education.       Transient blurred vision  Experienced a sudden episode of transient blurred vision lasting 30 seconds. Differential diagnosis includes ocular or neurological causes, but no definitive cause identified.  - Monitor for recurrence or additional symptoms.  - Consider ophthalmology referral if symptoms persist.    Migraine and recurrent headaches  Frequent migraines and recurrent headaches. No headache during recent blurred vision episode. Relationship between migraines and blurred vision unclear.  - Continue current headache management regimen.  - Reassess headache frequency and severity at next visit.       There are no diagnoses linked to this encounter.     The indications for early follow-up with PCP and return to UC were discussed. Patient/family received education on the working diagnosis, diagnostic uncertainties, and proposed treatment plan. Indications for emergency evaluation in the ED were reviewed. Written and verbal discharge instructions were provided and discussed and all questions from the patient/family were addressed, with no apparent barriers.  Verbal consent obtained to record this visit.

## 2024-07-04 ENCOUNTER — Encounter (INDEPENDENT_AMBULATORY_CARE_PROVIDER_SITE_OTHER): Payer: Self-pay

## 2025-04-19 ENCOUNTER — Ambulatory Visit (INDEPENDENT_AMBULATORY_CARE_PROVIDER_SITE_OTHER): Admitting: Obstetrics & Gynecology
